# Patient Record
Sex: Male | Born: 1940 | Race: White | Hispanic: No | Smoking: Former smoker
Health system: Southern US, Community
[De-identification: ages and names within clinical notes are randomized; demographics above are authoritative.]

## PROBLEM LIST (undated history)

## (undated) HISTORY — PX: HERNIA REPAIR: SHX51

## (undated) HISTORY — PX: CORONARY ARTERY BYPASS GRAFT: SHX141

---

## 2014-08-26 DIAGNOSIS — E119 Type 2 diabetes mellitus without complications: Secondary | ICD-10-CM

## 2014-08-26 DIAGNOSIS — Z951 Presence of aortocoronary bypass graft: Secondary | ICD-10-CM

## 2014-08-26 DIAGNOSIS — I429 Cardiomyopathy, unspecified: Secondary | ICD-10-CM

## 2014-08-26 HISTORY — DX: Type 2 diabetes mellitus without complications: E11.9

## 2014-08-26 HISTORY — DX: Cardiomyopathy, unspecified: I42.9

## 2014-08-26 HISTORY — DX: Presence of aortocoronary bypass graft: Z95.1

## 2014-09-23 DIAGNOSIS — I5022 Chronic systolic (congestive) heart failure: Secondary | ICD-10-CM

## 2014-09-23 DIAGNOSIS — Z95810 Presence of automatic (implantable) cardiac defibrillator: Secondary | ICD-10-CM

## 2014-09-23 HISTORY — DX: Chronic systolic (congestive) heart failure: I50.22

## 2014-09-23 HISTORY — DX: Presence of automatic (implantable) cardiac defibrillator: Z95.810

## 2016-04-20 DIAGNOSIS — I480 Paroxysmal atrial fibrillation: Secondary | ICD-10-CM

## 2016-04-20 DIAGNOSIS — Z8739 Personal history of other diseases of the musculoskeletal system and connective tissue: Secondary | ICD-10-CM

## 2016-04-20 DIAGNOSIS — I2510 Atherosclerotic heart disease of native coronary artery without angina pectoris: Secondary | ICD-10-CM

## 2016-04-20 DIAGNOSIS — G4733 Obstructive sleep apnea (adult) (pediatric): Secondary | ICD-10-CM

## 2016-04-20 DIAGNOSIS — E785 Hyperlipidemia, unspecified: Secondary | ICD-10-CM | POA: Diagnosis present

## 2016-04-20 DIAGNOSIS — N183 Chronic kidney disease, stage 3 unspecified: Secondary | ICD-10-CM

## 2016-04-20 HISTORY — DX: Paroxysmal atrial fibrillation: I48.0

## 2016-04-20 HISTORY — DX: Obstructive sleep apnea (adult) (pediatric): G47.33

## 2016-04-20 HISTORY — DX: Chronic kidney disease, stage 3 unspecified: N18.30

## 2016-04-20 HISTORY — DX: Personal history of other diseases of the musculoskeletal system and connective tissue: Z87.39

## 2016-04-20 HISTORY — DX: Hyperlipidemia, unspecified: E78.5

## 2016-04-20 HISTORY — DX: Atherosclerotic heart disease of native coronary artery without angina pectoris: I25.10

## 2020-09-18 ENCOUNTER — Emergency Department (HOSPITAL_COMMUNITY): Payer: Medicare PPO

## 2020-09-18 ENCOUNTER — Inpatient Hospital Stay (HOSPITAL_COMMUNITY)
Admission: EM | Admit: 2020-09-18 | Discharge: 2020-09-25 | DRG: 092 | Disposition: A | Discharge status: Home Health | Payer: Medicare PPO | Attending: Internal Medicine | Admitting: Internal Medicine

## 2020-09-18 ENCOUNTER — Encounter (HOSPITAL_COMMUNITY): Payer: Self-pay | Admitting: Emergency Medicine

## 2020-09-18 ENCOUNTER — Other Ambulatory Visit: Payer: Self-pay

## 2020-09-18 DIAGNOSIS — D1809 Hemangioma of other sites: Secondary | ICD-10-CM | POA: Diagnosis present

## 2020-09-18 DIAGNOSIS — I130 Hypertensive heart and chronic kidney disease with heart failure and stage 1 through stage 4 chronic kidney disease, or unspecified chronic kidney disease: Secondary | ICD-10-CM | POA: Diagnosis present

## 2020-09-18 DIAGNOSIS — N183 Chronic kidney disease, stage 3 unspecified: Secondary | ICD-10-CM | POA: Diagnosis present

## 2020-09-18 DIAGNOSIS — Z79899 Other long term (current) drug therapy: Secondary | ICD-10-CM

## 2020-09-18 DIAGNOSIS — R339 Retention of urine, unspecified: Secondary | ICD-10-CM | POA: Diagnosis present

## 2020-09-18 DIAGNOSIS — R269 Unspecified abnormalities of gait and mobility: Secondary | ICD-10-CM | POA: Diagnosis present

## 2020-09-18 DIAGNOSIS — E1165 Type 2 diabetes mellitus with hyperglycemia: Secondary | ICD-10-CM | POA: Diagnosis present

## 2020-09-18 DIAGNOSIS — I429 Cardiomyopathy, unspecified: Secondary | ICD-10-CM | POA: Diagnosis present

## 2020-09-18 DIAGNOSIS — N184 Chronic kidney disease, stage 4 (severe): Secondary | ICD-10-CM | POA: Diagnosis present

## 2020-09-18 DIAGNOSIS — Z85820 Personal history of malignant melanoma of skin: Secondary | ICD-10-CM

## 2020-09-18 DIAGNOSIS — Z7984 Long term (current) use of oral hypoglycemic drugs: Secondary | ICD-10-CM

## 2020-09-18 DIAGNOSIS — E039 Hypothyroidism, unspecified: Secondary | ICD-10-CM | POA: Diagnosis present

## 2020-09-18 DIAGNOSIS — E1122 Type 2 diabetes mellitus with diabetic chronic kidney disease: Secondary | ICD-10-CM | POA: Diagnosis present

## 2020-09-18 DIAGNOSIS — M109 Gout, unspecified: Secondary | ICD-10-CM | POA: Diagnosis present

## 2020-09-18 DIAGNOSIS — G9520 Unspecified cord compression: Principal | ICD-10-CM | POA: Diagnosis present

## 2020-09-18 DIAGNOSIS — D509 Iron deficiency anemia, unspecified: Secondary | ICD-10-CM | POA: Diagnosis present

## 2020-09-18 DIAGNOSIS — E785 Hyperlipidemia, unspecified: Secondary | ICD-10-CM | POA: Diagnosis present

## 2020-09-18 DIAGNOSIS — I480 Paroxysmal atrial fibrillation: Secondary | ICD-10-CM | POA: Diagnosis present

## 2020-09-18 DIAGNOSIS — Z950 Presence of cardiac pacemaker: Secondary | ICD-10-CM

## 2020-09-18 DIAGNOSIS — Z7901 Long term (current) use of anticoagulants: Secondary | ICD-10-CM

## 2020-09-18 DIAGNOSIS — T380X5A Adverse effect of glucocorticoids and synthetic analogues, initial encounter: Secondary | ICD-10-CM | POA: Diagnosis present

## 2020-09-18 DIAGNOSIS — N179 Acute kidney failure, unspecified: Secondary | ICD-10-CM | POA: Diagnosis present

## 2020-09-18 DIAGNOSIS — Z95810 Presence of automatic (implantable) cardiac defibrillator: Secondary | ICD-10-CM | POA: Diagnosis present

## 2020-09-18 DIAGNOSIS — I2510 Atherosclerotic heart disease of native coronary artery without angina pectoris: Secondary | ICD-10-CM | POA: Diagnosis present

## 2020-09-18 DIAGNOSIS — D631 Anemia in chronic kidney disease: Secondary | ICD-10-CM | POA: Diagnosis present

## 2020-09-18 DIAGNOSIS — G8220 Paraplegia, unspecified: Secondary | ICD-10-CM | POA: Diagnosis present

## 2020-09-18 DIAGNOSIS — I69354 Hemiplegia and hemiparesis following cerebral infarction affecting left non-dominant side: Secondary | ICD-10-CM

## 2020-09-18 DIAGNOSIS — I5022 Chronic systolic (congestive) heart failure: Secondary | ICD-10-CM | POA: Diagnosis present

## 2020-09-18 DIAGNOSIS — Z951 Presence of aortocoronary bypass graft: Secondary | ICD-10-CM

## 2020-09-18 DIAGNOSIS — G4733 Obstructive sleep apnea (adult) (pediatric): Secondary | ICD-10-CM | POA: Diagnosis present

## 2020-09-18 DIAGNOSIS — Z8739 Personal history of other diseases of the musculoskeletal system and connective tissue: Secondary | ICD-10-CM

## 2020-09-18 DIAGNOSIS — Z20822 Contact with and (suspected) exposure to covid-19: Secondary | ICD-10-CM | POA: Diagnosis present

## 2020-09-18 DIAGNOSIS — E119 Type 2 diabetes mellitus without complications: Secondary | ICD-10-CM

## 2020-09-18 LAB — BASIC METABOLIC PANEL
Anion gap: 7 (ref 5–15)
BUN: 24 mg/dL — ABNORMAL HIGH (ref 8–23)
CO2: 26 mmol/L (ref 22–32)
Calcium: 10.4 mg/dL — ABNORMAL HIGH (ref 8.9–10.3)
Chloride: 104 mmol/L (ref 98–111)
Creatinine, Ser: 1.64 mg/dL — ABNORMAL HIGH (ref 0.61–1.24)
GFR, Estimated: 43 mL/min — ABNORMAL LOW (ref >60)
Glucose, Bld: 139 mg/dL — ABNORMAL HIGH (ref 70–99)
Potassium: 4.7 mmol/L (ref 3.5–5.1)
Sodium: 137 mmol/L (ref 135–145)

## 2020-09-18 LAB — URINALYSIS, ROUTINE W REFLEX MICROSCOPIC
Bacteria, UA: NONE SEEN
Bilirubin Urine: NEGATIVE
Glucose, UA: NEGATIVE mg/dL
Hgb urine dipstick: NEGATIVE
Ketones, ur: NEGATIVE mg/dL
Leukocytes,Ua: NEGATIVE
Nitrite: NEGATIVE
Protein, ur: 100 mg/dL — AB
Specific Gravity, Urine: 1.014 (ref 1.005–1.030)
pH: 5 (ref 5.0–8.0)

## 2020-09-18 LAB — VITAMIN B12: Vitamin B-12: 705 pg/mL (ref 180–914)

## 2020-09-18 LAB — CBC
HCT: 39.2 % (ref 39.0–52.0)
Hemoglobin: 12.2 g/dL — ABNORMAL LOW (ref 13.0–17.0)
MCH: 28.1 pg (ref 26.0–34.0)
MCHC: 31.1 g/dL (ref 30.0–36.0)
MCV: 90.3 fL (ref 80.0–100.0)
Platelets: 172 10*3/uL (ref 150–400)
RBC: 4.34 MIL/uL (ref 4.22–5.81)
RDW: 16.1 % — ABNORMAL HIGH (ref 11.5–15.5)
WBC: 8.4 10*3/uL (ref 4.0–10.5)
nRBC: 0 % (ref 0.0–0.2)

## 2020-09-18 LAB — TSH: TSH: 6.588 u[IU]/mL — ABNORMAL HIGH (ref 0.350–4.500)

## 2020-09-18 MED ORDER — DEXAMETHASONE SODIUM PHOSPHATE 10 MG/ML IJ SOLN
6.0000 mg | Freq: Four times a day (QID) | INTRAMUSCULAR | Status: DC
  Administered 2020-09-19 – 2020-09-23 (×19): 6 mg via INTRAVENOUS
  Filled 2020-09-18 (×19): qty 1

## 2020-09-18 NOTE — ED Notes (Signed)
Sandwich given 

## 2020-09-18 NOTE — ED Provider Notes (Addendum)
[facility] EMERGENCY DEPARTMENT Provider Note   CSN: [id number] Arrival date & time: 09/18/20  1149   Chief Complaint: Bilateral Leg Weakness  History Chief Complaint  Patient presents with  . Weakness    Timothy Neal is a 80 y.o. male.  HPI   Timothy Neal is a 80 y.o. gentleman w/ PMHx multiple skin cancers including melanoma resected x 2, CABG in 2000 and 2013, HFrEF, cardiomyopathy and PAF s/p ICD replacement 08/06/20 on Eliquis, CVA, DM type II, CKD stage 3, HTN, HLD, who presents to the ED from his living facility [facility]) after experiencing a mechanical fall in the setting of progressive bilateral lower extremity weakness. He states that he has chronic diabetic neuropathy and clubbing of his left foot, and first noticed significant weakness of his LLE after experiencing a CVA this past summer. His weakness improved with PT; however, over the past several months, he has had new, progressive weakness of both his right and left LE's especially within the past couple of days. Following his stroke, he was able to walk with a cane, although progressed to a walker and has required a wheelchair over the past month with limited ability to take steps. He notes associated spasms of his bilateral lower extremities and numbness that now extends up around his waste-line. He has also struggled more over the past several months with urinary retention, constipation, and intermittent stool incontinence. Notes chronic mild low back pain but denies any recent trauma or acute pain to his back or extremities. Denies fevers, chills, any other weakness, headaches, vision changes, flank pain, or any other symptoms. Notes that he is currently scheduled for MRI head and C spine through his neurologist at [facility].   PMHx: See above  Surgical Hx: CABG x 2 s/p ICD replacement in January 2022.  No spinal or LE surgeries.  Family Hx: Patient's father had a stroke.  No family history of  MS, Parkinson's Disease, or other known neurological conditions.      Home Medications Eliquis 5mg  BID Lipitor 40mg  daily Coreg 12.5mg  twice daily Vitamin D 1000mg  daily Digoxin 0.125mg  daily Enalapril 2.5mg  twice daily Ferrous sulfate 325mg  MWF Finasteride 5mg  daily Lasix 20mg  daily Glipizide XL 5mg  BID  Tresiba 18-20 units daily IMDUR 30mg  nightly Centrum Silver Men Oral daily  Multivitamin 1 tablet daily Ozempic 0.5mg  weekly Klor-Con 75mEq ER daily Rapaflo 8mg  daily Sotalol 80mg  BID Turmeric root extract 500mg  daily Colchicine 0.6mg  PRN Gabapentin 300mg  BID   Allergies    Patient has no known allergies.  Review of Systems   Review of Systems   10-point review of systems otherwise negative except as noted above in HPI.   Physical Exam Updated Vital Signs BP (!) 151/77   Pulse 67   Temp 98.3 F (36.8 C) (Oral)   Resp 15   SpO2 98%   Physical Exam   General: Patient appears well. No acute distress. Eyes: Sclera non-icteric. No conjunctival injection.  HENT: Neck is supple. No nasal discharge. Respiratory: Lungs are CTA, bilaterally. No wheezes, rales, or rhonchi.  Cardiovascular: Regular rate and rhythm. No murmurs, rubs, or gallops. There is minimal non-pitting bilateral lower extremity edema. LLE is cool to the touch.  Abdominal: Soft and non-tender to palpation. Bowel sounds intact. No rebound or guarding. Neurological: Patient is alert and oriented x 3. Sensation to light touch is absent to the knees in bilateral lower extremities.  Musculoskeletal: Strength is 2/5 throughout bilateral lower extremities. There is increased muscle tone  with normal muscle bulk in bilateral lower extremities. There is clubbing of the left foot. Skin: No lesions. No rashes.  Psych: Normal affect. Normal tone of voice.   ED Results / Procedures / Treatments   Labs (all labs ordered are listed, but only abnormal results are displayed) Labs Reviewed  BASIC METABOLIC PANEL -  Abnormal; Notable for the following components:      Result Value   Glucose, Bld 139 (*)    BUN 24 (*)    Creatinine, Ser 1.64 (*)    Calcium 10.4 (*)    GFR, Estimated 43 (*)    All other components within normal limits  CBC - Abnormal; Notable for the following components:   Hemoglobin 12.2 (*)    RDW 16.1 (*)    All other components within normal limits  URINALYSIS, ROUTINE W REFLEX MICROSCOPIC - Abnormal; Notable for the following components:   Protein, ur 100 (*)    All other components within normal limits  TSH - Abnormal; Notable for the following components:   TSH 6.588 (*)    All other components within normal limits  RESP PANEL BY RT-PCR (FLU A&B, COVID) ARPGX2  VITAMIN B12  CBG MONITORING, ED    EKG None  Radiology CT Head Wo Contrast  Result Date: 09/18/2020 CLINICAL DATA:  Bilateral lower extremity weakness, fell, anticoagulated EXAM: CT HEAD WITHOUT CONTRAST TECHNIQUE: Contiguous axial images were obtained from the base of the skull through the vertex without intravenous contrast. COMPARISON:  None. FINDINGS: Brain: Hypodensities within the right basal ganglia, bilateral periventricular white matter, and right frontal subcortical white matter are compatible with chronic ischemic changes. No signs of acute infarct or hemorrhage. The lateral ventricles and remaining midline structures are unremarkable. No acute extra-axial fluid collections. No mass effect. Vascular: Moderate atherosclerosis.  No hyperdense vessel. Skull: Normal. Negative for fracture or focal lesion. Sinuses/Orbits: There is opacification of the bilateral maxillary sinuses. Remaining paranasal sinuses are clear. Other: None. IMPRESSION: 1. No acute intracranial process. 2. Bilateral maxillary sinus disease. Electronically Signed   By: Randa Ngo M.D.   On: 09/18/2020 20:36   CT Cervical Spine Wo Contrast  Result Date: 09/18/2020 CLINICAL DATA:  Bilateral lower extremity weakness, fell this morning  EXAM: CT CERVICAL SPINE WITHOUT CONTRAST TECHNIQUE: Multidetector CT imaging of the cervical spine was performed without intravenous contrast. Multiplanar CT image reconstructions were also generated. COMPARISON:  None. FINDINGS: Alignment: Alignment is anatomic. Skull base and vertebrae: No acute fracture. No primary bone lesion or focal pathologic process. Soft tissues and spinal canal: No prevertebral fluid or swelling. No visible canal hematoma. Extensive atherosclerosis at the carotid bifurcations. Disc levels: Bony fusion across the left C2/C3 facet joints. Mild spondylosis at C5-6, with symmetrical bilateral neural foraminal encroachment. Upper chest: Airway is patent.  Lung apices are clear. Other: Reconstructed images demonstrate no additional findings. IMPRESSION: 1. No acute cervical spine fracture. 2. Mild C5/C6 spondylosis with minimal bilateral symmetrical neural foraminal narrowing. Electronically Signed   By: Randa Ngo M.D.   On: 09/18/2020 20:38   CT Thoracic Spine Wo Contrast  Result Date: 09/18/2020 CLINICAL DATA:  Falling. Bilateral leg weakness. Symptoms are worsening. EXAM: CT THORACIC SPINE WITHOUT CONTRAST TECHNIQUE: Multidetector CT images of the thoracic were obtained using the standard protocol without intravenous contrast. COMPARISON:  None. FINDINGS: Alignment: No significant malalignment. Mild scoliotic curvature convex to the right. Vertebrae: No thoracic region fracture. Coarse trabeculations affecting the T6 vertebral body. Posterior margin of the vertebral body appears to  bow. The appearance is most consistent with hemangioma, but 1 that is expanding the bone. There could even be some extraosseous extension of tumor. Thoracic MRI would be suggested. The lesion also extends into the posterior elements. Alternatively, this could be malignant involvement of the bone simulating hemangioma. Below that, no focal bone finding is present. Paraspinal and other soft tissues: Elevated  left hemidiaphragm with left base volume loss. Aortic atherosclerotic calcification. Disc levels: No evidence of significant degenerative disc disease or facet arthropathy. IMPRESSION: Coarse trabeculations affecting the T6 vertebral body, including involvement extending into the posterior elements. Posterior margin of the vertebral body appears to bow. The appearance is most consistent with hemangioma, but one that is expanding the bone. There could even be some extraosseous extension of tumor. Alternatively, this could be malignant involvement of the bone simulating hemangioma. In either case, there appears to be encroachment upon the spinal canal that could cause thoracic cord compression. Thoracic MRI would be suggested. Aortic Atherosclerosis (ICD10-I70.0). Electronically Signed   By: Nelson Chimes M.D.   On: 09/18/2020 20:42   CT Lumbar Spine Wo Contrast  Result Date: 09/18/2020 CLINICAL DATA:  Worsening lower extremity weakness.  Recent falls. EXAM: CT LUMBAR SPINE WITHOUT CONTRAST TECHNIQUE: Multidetector CT imaging of the lumbar spine was performed without intravenous contrast administration. Multiplanar CT image reconstructions were also generated. COMPARISON:  None. FINDINGS: Segmentation: 5 lumbar type vertebral bodies. Alignment: Normal Vertebrae: No fracture or primary bone lesion. Paraspinal and other soft tissues: Aortic atherosclerotic calcification. Some renal atrophy on the right. Disc levels: No significant lumbar region degenerative disease. Mild non-compressive disc bulges. Mild facet osteoarthritis. No significant stenosis of the canal or foramina. IMPRESSION: 1. No acute or traumatic finding. Mild non-compressive disc bulges. Mild facet osteoarthritis. No significant stenosis of the canal or foramina. 2. Aortic atherosclerosis. 3. Right renal atrophy. Aortic Atherosclerosis (ICD10-I70.0). Electronically Signed   By: Nelson Chimes M.D.   On: 09/18/2020 20:44    Procedures Procedures    Medications Ordered in ED Medications  dexamethasone (DECADRON) injection 6 mg (has no administration in time range)    ED Course  I have reviewed the triage vital signs and the nursing notes.  Pertinent labs & imaging results that were available during my care of the patient were reviewed by me and considered in my medical decision making (see chart for details).    MDM Rules/Calculators/A&P                          Assessment:  Mr. Stein has been struggling with rapidly progressive bilateral lower extremity weakness to the point that he is now largely wheelchair bound and presents after experiencing mechanical fall secondary to this earlier today. He follows with Dr. Billy Coast at [facility] for this and was scheduled to get outpatient MRI's of his head and C spine in 3 days although was instructed to come to the ED for evaluation more acutely. Symptoms may be due to worsening diabetic neuropathy, physical deconditioning, spinal cord compression / spinal lesion, or central mass or lesion.   Plan:   - Check CBC, BMP, TSH, B12 levels, urinalysis  - Check CT Head without contrast - Check CT C/T/L spine without contrast   8:50pm: CT T spine notable for T6 encroachment of (most likely) hemangioma vs. Malignancy involving the bone upon the spinal canal that may be causing thoracic cord compression. CT L spine notable for mild non-compressive disc bulges and facet osteoarthritis although without  significant stenosis of the canal or foramina. CT C spine shows mild C5/C6 spondylosis with minimal bilateral symmetrical neural foraminal narrowing. CT Head shows no acute intracranial process. Of note, patient has incidental right renal atrophy.   Labs consistent with possible mild AKI on CKD stage 3, mild normocytic anemia with Hgb 12.2, albuminuria although without signs of infection on urinalysis. TSH and B12 are pending.   T6 spinal cord encroachment could explain patients symptomatology.  Patient would benefit from T spine MRI. However, patient is unsure of which ICD device he has and device type is not listed in patient's medical records from [facility]. MRI with cardiac devices unable to be performed over the weekend per radiology.   - Will consult Neurosurgery   10:35pm: Spoke to neurosurgery who suggest initiating Decadron 6mg  q6 hours, admission to internal medicine for further cancer workup, to find out if patient's ICD is MRI compatible, and to get MRI scheduled ASAP. Anticipate patient would be a poor surgical candidate and do not anticipate any emergent procedures; however, will assess patient tomorrow morning.   - Started Decadron 6mg  q6 hours  - Continue Eliquis for now   11:25pm: Spoke with [facility] hospitalist who will admit patient to internal medicine    Final Clinical Impression(s) / ED Diagnoses Final diagnoses:  Type 2 DM with CKD stage 3 and hypertension ([place])  Spinal cord compression [facility])    Rx / DC Orders ED Discharge Orders    None     Jeralyn Bennett, MD 09/18/2020, 11:33 PM Pager: [phone]    Jeralyn Bennett, MD 09/18/20 1093    Quintella Reichert, MD 09/19/20 1236

## 2020-09-18 NOTE — ED Notes (Signed)
Swab for covid sent to lab

## 2020-09-18 NOTE — ED Triage Notes (Signed)
Pt arrives from [facility] where he has been living for 1 month.  He reports for over 2 week he has began to notice a gradual weakness in both legs to th point where now he is unable to stand on either leg. This morning he had a fall trying to transition from wheelchair to his bed.

## 2020-09-19 ENCOUNTER — Encounter (HOSPITAL_COMMUNITY): Payer: Self-pay | Admitting: Osteopathic Medicine

## 2020-09-19 ENCOUNTER — Inpatient Hospital Stay (HOSPITAL_COMMUNITY): Payer: Medicare PPO

## 2020-09-19 DIAGNOSIS — E7800 Pure hypercholesterolemia, unspecified: Secondary | ICD-10-CM

## 2020-09-19 DIAGNOSIS — T380X5A Adverse effect of glucocorticoids and synthetic analogues, initial encounter: Secondary | ICD-10-CM | POA: Diagnosis present

## 2020-09-19 DIAGNOSIS — I129 Hypertensive chronic kidney disease with stage 1 through stage 4 chronic kidney disease, or unspecified chronic kidney disease: Secondary | ICD-10-CM

## 2020-09-19 DIAGNOSIS — I69354 Hemiplegia and hemiparesis following cerebral infarction affecting left non-dominant side: Secondary | ICD-10-CM | POA: Diagnosis not present

## 2020-09-19 DIAGNOSIS — E785 Hyperlipidemia, unspecified: Secondary | ICD-10-CM | POA: Diagnosis present

## 2020-09-19 DIAGNOSIS — G4733 Obstructive sleep apnea (adult) (pediatric): Secondary | ICD-10-CM

## 2020-09-19 DIAGNOSIS — N183 Chronic kidney disease, stage 3 unspecified: Secondary | ICD-10-CM

## 2020-09-19 DIAGNOSIS — R269 Unspecified abnormalities of gait and mobility: Secondary | ICD-10-CM | POA: Diagnosis present

## 2020-09-19 DIAGNOSIS — N184 Chronic kidney disease, stage 4 (severe): Secondary | ICD-10-CM

## 2020-09-19 DIAGNOSIS — I429 Cardiomyopathy, unspecified: Secondary | ICD-10-CM | POA: Diagnosis present

## 2020-09-19 DIAGNOSIS — G9520 Unspecified cord compression: Secondary | ICD-10-CM | POA: Diagnosis present

## 2020-09-19 DIAGNOSIS — N179 Acute kidney failure, unspecified: Secondary | ICD-10-CM | POA: Diagnosis present

## 2020-09-19 DIAGNOSIS — E1122 Type 2 diabetes mellitus with diabetic chronic kidney disease: Secondary | ICD-10-CM

## 2020-09-19 DIAGNOSIS — I130 Hypertensive heart and chronic kidney disease with heart failure and stage 1 through stage 4 chronic kidney disease, or unspecified chronic kidney disease: Secondary | ICD-10-CM | POA: Diagnosis present

## 2020-09-19 DIAGNOSIS — Z7901 Long term (current) use of anticoagulants: Secondary | ICD-10-CM | POA: Diagnosis not present

## 2020-09-19 DIAGNOSIS — D509 Iron deficiency anemia, unspecified: Secondary | ICD-10-CM | POA: Diagnosis present

## 2020-09-19 DIAGNOSIS — I480 Paroxysmal atrial fibrillation: Secondary | ICD-10-CM | POA: Diagnosis present

## 2020-09-19 DIAGNOSIS — Z20822 Contact with and (suspected) exposure to covid-19: Secondary | ICD-10-CM | POA: Diagnosis present

## 2020-09-19 DIAGNOSIS — R339 Retention of urine, unspecified: Secondary | ICD-10-CM | POA: Diagnosis present

## 2020-09-19 DIAGNOSIS — Z79899 Other long term (current) drug therapy: Secondary | ICD-10-CM | POA: Diagnosis not present

## 2020-09-19 DIAGNOSIS — Z95810 Presence of automatic (implantable) cardiac defibrillator: Secondary | ICD-10-CM | POA: Diagnosis not present

## 2020-09-19 DIAGNOSIS — G8220 Paraplegia, unspecified: Secondary | ICD-10-CM

## 2020-09-19 DIAGNOSIS — I2510 Atherosclerotic heart disease of native coronary artery without angina pectoris: Secondary | ICD-10-CM | POA: Diagnosis present

## 2020-09-19 DIAGNOSIS — E039 Hypothyroidism, unspecified: Secondary | ICD-10-CM | POA: Diagnosis present

## 2020-09-19 DIAGNOSIS — Z951 Presence of aortocoronary bypass graft: Secondary | ICD-10-CM | POA: Diagnosis not present

## 2020-09-19 DIAGNOSIS — D1809 Hemangioma of other sites: Secondary | ICD-10-CM | POA: Diagnosis present

## 2020-09-19 DIAGNOSIS — Z7984 Long term (current) use of oral hypoglycemic drugs: Secondary | ICD-10-CM | POA: Diagnosis not present

## 2020-09-19 DIAGNOSIS — I5022 Chronic systolic (congestive) heart failure: Secondary | ICD-10-CM | POA: Diagnosis present

## 2020-09-19 DIAGNOSIS — E1165 Type 2 diabetes mellitus with hyperglycemia: Secondary | ICD-10-CM

## 2020-09-19 DIAGNOSIS — I255 Ischemic cardiomyopathy: Secondary | ICD-10-CM | POA: Diagnosis not present

## 2020-09-19 HISTORY — DX: Unspecified cord compression: G95.20

## 2020-09-19 LAB — CBC WITH DIFFERENTIAL/PLATELET
Abs Immature Granulocytes: 0.02 10*3/uL (ref 0.00–0.07)
Basophils Absolute: 0 10*3/uL (ref 0.0–0.1)
Basophils Relative: 0 %
Eosinophils Absolute: 0 10*3/uL (ref 0.0–0.5)
Eosinophils Relative: 0 %
HCT: 37.1 % — ABNORMAL LOW (ref 39.0–52.0)
Hemoglobin: 12.4 g/dL — ABNORMAL LOW (ref 13.0–17.0)
Immature Granulocytes: 0 %
Lymphocytes Relative: 9 %
Lymphs Abs: 0.5 10*3/uL — ABNORMAL LOW (ref 0.7–4.0)
MCH: 29.5 pg (ref 26.0–34.0)
MCHC: 33.4 g/dL (ref 30.0–36.0)
MCV: 88.1 fL (ref 80.0–100.0)
Monocytes Absolute: 0 10*3/uL — ABNORMAL LOW (ref 0.1–1.0)
Monocytes Relative: 0 %
Neutro Abs: 4.6 10*3/uL (ref 1.7–7.7)
Neutrophils Relative %: 91 %
Platelets: 167 10*3/uL (ref 150–400)
RBC: 4.21 MIL/uL — ABNORMAL LOW (ref 4.22–5.81)
RDW: 16.2 % — ABNORMAL HIGH (ref 11.5–15.5)
WBC: 5.1 10*3/uL (ref 4.0–10.5)
nRBC: 0 % (ref 0.0–0.2)

## 2020-09-19 LAB — COMPREHENSIVE METABOLIC PANEL
ALT: 26 U/L (ref 0–44)
AST: 28 U/L (ref 15–41)
Albumin: 3.2 g/dL — ABNORMAL LOW (ref 3.5–5.0)
Alkaline Phosphatase: 41 U/L (ref 38–126)
Anion gap: 10 (ref 5–15)
BUN: 29 mg/dL — ABNORMAL HIGH (ref 8–23)
CO2: 21 mmol/L — ABNORMAL LOW (ref 22–32)
Calcium: 9.8 mg/dL (ref 8.9–10.3)
Chloride: 104 mmol/L (ref 98–111)
Creatinine, Ser: 1.64 mg/dL — ABNORMAL HIGH (ref 0.61–1.24)
GFR, Estimated: 43 mL/min — ABNORMAL LOW (ref >60)
Glucose, Bld: 398 mg/dL — ABNORMAL HIGH (ref 70–99)
Potassium: 4.5 mmol/L (ref 3.5–5.1)
Sodium: 135 mmol/L (ref 135–145)
Total Bilirubin: 1.1 mg/dL (ref 0.3–1.2)
Total Protein: 6.9 g/dL (ref 6.5–8.1)

## 2020-09-19 LAB — GLUCOSE, CAPILLARY
Glucose-Capillary: 197 mg/dL — ABNORMAL HIGH (ref 70–99)
Glucose-Capillary: 211 mg/dL — ABNORMAL HIGH (ref 70–99)
Glucose-Capillary: 215 mg/dL — ABNORMAL HIGH (ref 70–99)
Glucose-Capillary: 243 mg/dL — ABNORMAL HIGH (ref 70–99)
Glucose-Capillary: 313 mg/dL — ABNORMAL HIGH (ref 70–99)

## 2020-09-19 LAB — LIPID PANEL
Cholesterol: 156 mg/dL (ref 0–200)
HDL: 32 mg/dL — ABNORMAL LOW (ref >40)
LDL Cholesterol: 91 mg/dL (ref 0–99)
Total CHOL/HDL Ratio: 4.9 RATIO
Triglycerides: 166 mg/dL — ABNORMAL HIGH (ref <150)
VLDL: 33 mg/dL (ref 0–40)

## 2020-09-19 LAB — HEMOGLOBIN A1C
Hgb A1c MFr Bld: 6.7 % — ABNORMAL HIGH (ref 4.8–5.6)
Mean Plasma Glucose: 145.59 mg/dL

## 2020-09-19 LAB — RESP PANEL BY RT-PCR (FLU A&B, COVID) ARPGX2
Influenza A by PCR: NEGATIVE
Influenza B by PCR: NEGATIVE
SARS Coronavirus 2 by RT PCR: NEGATIVE

## 2020-09-19 LAB — MAGNESIUM: Magnesium: 2 mg/dL (ref 1.7–2.4)

## 2020-09-19 LAB — PHOSPHORUS: Phosphorus: 3.6 mg/dL (ref 2.5–4.6)

## 2020-09-19 MED ORDER — IOHEXOL 9 MG/ML PO SOLN
500.0000 mL | ORAL | Status: AC
  Administered 2020-09-19 (×2): 500 mL via ORAL

## 2020-09-19 MED ORDER — SODIUM CHLORIDE 0.9% FLUSH: 3.0000 mL | INTRAVENOUS | Status: DC | PRN

## 2020-09-19 MED ORDER — SOTALOL HCL 80 MG PO TABS
40.0000 mg | ORAL_TABLET | Freq: Every evening | ORAL | Status: DC
  Administered 2020-09-19 – 2020-09-20 (×2): 40 mg via ORAL
  Filled 2020-09-19 (×2): qty 0.5

## 2020-09-19 MED ORDER — DIGOXIN 125 MCG PO TABS
65.2000 ug | ORAL_TABLET | Freq: Every day | ORAL | Status: DC
  Administered 2020-09-19 – 2020-09-24 (×6): 62.5 ug via ORAL
  Filled 2020-09-19: qty 1
  Filled 2020-09-19: qty 0.5
  Filled 2020-09-19 (×4): qty 1

## 2020-09-19 MED ORDER — TAMSULOSIN HCL 0.4 MG PO CAPS
0.4000 mg | ORAL_CAPSULE | Freq: Every day | ORAL | Status: DC
  Administered 2020-09-20 – 2020-09-25 (×6): 0.4 mg via ORAL
  Filled 2020-09-19 (×8): qty 1

## 2020-09-19 MED ORDER — SEMAGLUTIDE(0.25 OR 0.5MG/DOS) 2 MG/1.5ML ~~LOC~~ SOPN
0.5000 mg | PEN_INJECTOR | SUBCUTANEOUS | Status: DC
Start: 2020-09-19 — End: 2020-09-20

## 2020-09-19 MED ORDER — APIXABAN 2.5 MG PO TABS
2.5000 mg | ORAL_TABLET | Freq: Two times a day (BID) | ORAL | Status: DC
  Administered 2020-09-19 – 2020-09-25 (×13): 2.5 mg via ORAL
  Filled 2020-09-19 (×13): qty 1

## 2020-09-19 MED ORDER — HEPARIN SODIUM (PORCINE) 5000 UNIT/ML IJ SOLN
5000.0000 [IU] | Freq: Three times a day (TID) | INTRAMUSCULAR | Status: DC
  Administered 2020-09-19: 5000 [IU] via SUBCUTANEOUS
  Filled 2020-09-19: qty 1

## 2020-09-19 MED ORDER — INSULIN ASPART 100 UNIT/ML ~~LOC~~ SOLN
0.0000 [IU] | SUBCUTANEOUS | Status: DC
  Administered 2020-09-19: 11 [IU] via SUBCUTANEOUS
  Administered 2020-09-19 (×2): 5 [IU] via SUBCUTANEOUS
  Administered 2020-09-20: 3 [IU] via SUBCUTANEOUS
  Administered 2020-09-20 (×2): 8 [IU] via SUBCUTANEOUS
  Administered 2020-09-20: 5 [IU] via SUBCUTANEOUS
  Administered 2020-09-20 – 2020-09-21 (×3): 3 [IU] via SUBCUTANEOUS
  Administered 2020-09-21: 8 [IU] via SUBCUTANEOUS
  Administered 2020-09-21: 5 [IU] via SUBCUTANEOUS
  Administered 2020-09-21: 3 [IU] via SUBCUTANEOUS
  Administered 2020-09-21: 11 [IU] via SUBCUTANEOUS
  Administered 2020-09-22: 3 [IU] via SUBCUTANEOUS
  Administered 2020-09-22: 8 [IU] via SUBCUTANEOUS
  Administered 2020-09-22: 5 [IU] via SUBCUTANEOUS
  Administered 2020-09-22: 3 [IU] via SUBCUTANEOUS

## 2020-09-19 MED ORDER — TAMSULOSIN HCL 0.4 MG PO CAPS: 0.4000 mg | ORAL_CAPSULE | Freq: Every day | ORAL | Status: DC

## 2020-09-19 MED ORDER — SODIUM CHLORIDE 0.9% FLUSH
3.0000 mL | Freq: Two times a day (BID) | INTRAVENOUS | Status: DC
  Administered 2020-09-19 – 2020-09-25 (×13): 3 mL via INTRAVENOUS

## 2020-09-19 MED ORDER — SODIUM CHLORIDE 0.9 % IV SOLN
250.0000 mL | INTRAVENOUS | Status: DC | PRN
Start: 2020-09-19 — End: 2020-09-25

## 2020-09-19 MED ORDER — FINASTERIDE 5 MG PO TABS
5.0000 mg | ORAL_TABLET | Freq: Every day | ORAL | Status: DC
  Administered 2020-09-19 – 2020-09-24 (×6): 5 mg via ORAL
  Filled 2020-09-19 (×6): qty 1

## 2020-09-19 MED ORDER — ISOSORBIDE MONONITRATE ER 30 MG PO TB24
30.0000 mg | ORAL_TABLET | Freq: Every evening | ORAL | Status: DC
  Administered 2020-09-19 – 2020-09-20 (×2): 30 mg via ORAL
  Filled 2020-09-19 (×2): qty 1

## 2020-09-19 NOTE — Progress Notes (Signed)
PROGRESS NOTE    Timothy Neal  GGE:[id number] DOB: [birth year] DOA: 09/18/2020 PCP: [name], MD     Brief Narrative:  [name] is a 80 y.o.WM PMHx CAD, S/P CABG in 2000 and 4650, Chronic Systolic CHF, Cardiomyopathy, Paroxysmal Atrial fibrillation on Eliquis, S/P  ICD replacement 08/06/2020, HTN, CVA with some residual left-sided weakness, DM type II, CKD stage III, HLD,  Presented to the ED after mechanical fall due to progressive bilateral lower extremity weakness which has been ongoing for some time but getting worse over the past few days.  He has progressed from ambulation with cane, to walker, to rollator. Has been following with neurosurgery, was actually scheduled to get an MRI this upcoming Monday (today is Friday/Saturday).   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  Labs consistent with mild AKI on CKD stage III, mild normocytic anemia.  Unable to obtain MRI, per radiology MRI not available through the weekend for patients with implanted ICD.  ED spoke to neurosurgery, initiated Decadron every 6 hours and plan to get MRI ASAP.  Anticipate probably poor surgical candidate but unlikely to require emergent surgery as long as he remains stable, will follow patient closely   Subjective: A/O x4, negative S OB, negative back pain.  Able to move lower extremities slightly   Assessment & Plan: Covid vaccination; vaccinated 3/3   Principal Problem:   Spinal cord compression ([place]) Active Problems:   Cardiomyopathy ([place])   Chronic kidney disease, stage 3 unspecified (HCC)   Chronic systolic heart failure (HCC)   Coronary artery disease   Diabetes mellitus, type II (HCC)   Dyslipidemia   History of gout   Obstructive sleep apnea (adult) (pediatric)   Paroxysmal atrial fibrillation (HCC)   S/P CABG (coronary artery bypass graft)   S/P ICD (internal cardiac defibrillator) procedure   CKD (chronic kidney  disease), stage IV (HCC)   Controlled type 2 diabetes mellitus with hyperglycemia (HCC)   HLD (hyperlipidemia)   Hypothyroidism  Spinal cord compression/mechanical fall -Is, the rapidly progressive lower extremity weakness,  --> Continue Decadron 6 mg every 6 hours. --> Plan for MRI likely Monday, on record review from telephone encounter with his cardiologist 08/24/2020 he CAN have MRI with his current ICD  --> Neurosurgery consult in place, not likely to require urgent intervention, pending MRI  Paroxysmal atrial fibrillation -2/19 currently NSR -Apixaban 2.5 mg BID -Digoxin 62.5 mcg daily -Eliquis . -S/p ICD placement -Imdur 30 mg daily -Sotalol 40 mg daily  Chronic systolic CHF -See A. fib -Strict in and out -Daily weight  OSA  -CPAP per respiratory settings  CKD stage IV (baseline Cr 2.19; at [facility] 04/07/2020) Lab Results  Component Value Date   CREATININE 1.64 (H) 09/19/2020   CREATININE 1.64 (H) 09/18/2020  -Improved from baseline  DM type II controlled with hyperglycemia -12/19 hemoglobin A1c= 6.7 -2/19 moderate SSI  HLD -LDL= 91; goal LDL<70 -2/19 Lipitor 20 mg daily  Hypothyroidism -2/18 TSH= 6.5 elevated -2/19 US thyroid pending     DVT prophylaxis: Eliquis Code Status: Full Family Communication: 2/19 Sister at bedside for discussion of plan of care all questions answered Status is: Inpatient    Dispo: The patient is from: Home              Anticipated d/c is to: Home              Anticipated d/c date is: 2/28  Patient currently unstable      Consultants:  Neurosurgery Dr. Arnoldo Morale    Procedures/Significant Events:    I have personally reviewed and interpreted all radiology studies and my findings are as above.  VENTILATOR SETTINGS:    Cultures   Antimicrobials:    Devices    LINES / TUBES:      Continuous Infusions: . sodium chloride       Objective: Vitals:   09/19/20 0530 09/19/20 0600  09/19/20 0638 09/19/20 1603  BP: (!) 131/57 (!) 128/57 (!) 160/77 (!) 141/64  Pulse: 70 70 72 69  Resp: 18 (!) 23 16 18   Temp:   (!) 97.4 F (36.3 C) 97.8 F (36.6 C)  TempSrc:   Oral Oral  SpO2: 97% 96% 95%   Weight:      Height:        Intake/Output Summary (Last 24 hours) at 09/19/2020 1826 Last data filed at 09/19/2020 1732 Gross per 24 hour  Intake --  Output 300 ml  Net -300 ml   Filed Weights   09/19/20 0003  Weight: 68 kg    Examination:  General: No acute respiratory distress Eyes: negative scleral hemorrhage, negative anisocoria, negative icterus ENT: Negative Runny nose, negative gingival bleeding, Neck:  Negative scars, masses, torticollis, lymphadenopathy, JVD Lungs: Clear to auscultation bilaterally without wheezes or crackles Cardiovascular: Regular rate and rhythm without murmur gallop or rub normal S1 and S2 Abdomen: negative abdominal pain, nondistended, positive soft, bowel sounds, no rebound, no ascites, no appreciable mass Extremities: No significant cyanosis, clubbing, or edema bilateral lower extremities Skin: Negative rashes, lesions, ulcers Psychiatric:  Negative depression, negative anxiety, negative fatigue, negative mania  Central nervous system:  Cranial nerves II through XII intact, tongue/uvula midline, bilateral upper extremities muscle strength 5/5, bilateral lower extremity strength 1/5, sensation intact throughout bilateral upper extremity, sensation bilateral lower extremity decreased soft touch and pinprick  negative dysarthria, negative expressive aphasia, negative receptive aphasia.  .     Data Reviewed: Care during the described time interval was provided by me .  I have reviewed this patient's available data, including medical history, events of note, physical examination, and all test results as part of my evaluation.  CBC: Recent Labs  Lab 09/18/20 1225 09/19/20 0957  WBC 8.4 5.1  NEUTROABS  --  4.6  HGB 12.2* 12.4*  HCT  39.2 37.1*  MCV 90.3 88.1  PLT 172 539   Basic Metabolic Panel: Recent Labs  Lab 09/18/20 1225 09/19/20 0957  NA 137 135  K 4.7 4.5  CL 104 104  CO2 26 21*  GLUCOSE 139* 398*  BUN 24* 29*  CREATININE 1.64* 1.64*  CALCIUM 10.4* 9.8  MG  --  2.0  PHOS  --  3.6   GFR: Estimated Creatinine Clearance: 34.7 mL/min (A) (by C-G formula based on SCr of 1.64 mg/dL (H)). Liver Function Tests: Recent Labs  Lab 09/19/20 0957  AST 28  ALT 26  ALKPHOS 41  BILITOT 1.1  PROT 6.9  ALBUMIN 3.2*   No results for input(s): LIPASE, AMYLASE in the last 168 hours. No results for input(s): AMMONIA in the last 168 hours. Coagulation Profile: No results for input(s): INR, PROTIME in the last 168 hours. Cardiac Enzymes: No results for input(s): CKTOTAL, CKMB, CKMBINDEX, TROPONINI in the last 168 hours. BNP (last 3 results) No results for input(s): PROBNP in the last 8760 hours. HbA1C: Recent Labs    09/19/20 0957  HGBA1C 6.7*   CBG: Recent Labs  Lab 09/19/20 0806 09/19/20 1246 09/19/20 1737  GLUCAP 215* 313* 243*   Lipid Profile: Recent Labs    09/19/20 0957  CHOL 156  HDL 32*  LDLCALC 91  TRIG 166*  CHOLHDL 4.9   Thyroid Function Tests: Recent Labs    09/18/20 2146  TSH 6.588*   Anemia Panel: Recent Labs    09/18/20 2146  VITAMINB12 705   Sepsis Labs: No results for input(s): PROCALCITON, LATICACIDVEN in the last 168 hours.  Recent Results (from the past 240 hour(s))  Resp Panel by RT-PCR (Flu A&B, Covid) Nasopharyngeal Swab     Status: None   Collection Time: 09/18/20 11:16 PM   Specimen: Nasopharyngeal Swab; Nasopharyngeal(NP) swabs in vial transport medium  Result Value Ref Range Status   SARS Coronavirus 2 by RT PCR NEGATIVE NEGATIVE Final    Comment: (NOTE) SARS-CoV-2 target nucleic acids are NOT DETECTED.  The SARS-CoV-2 RNA is generally detectable in upper respiratory specimens during the acute phase of infection. The lowest concentration of  SARS-CoV-2 viral copies this assay can detect is 138 copies/mL. A negative result does not preclude SARS-Cov-2 infection and should not be used as the sole basis for treatment or other patient management decisions. A negative result may occur with  improper specimen collection/handling, submission of specimen other than nasopharyngeal swab, presence of viral mutation(s) within the areas targeted by this assay, and inadequate number of viral copies(<138 copies/mL). A negative result must be combined with clinical observations, patient history, and epidemiological information. The expected result is Negative.  Fact Sheet for Patients:  EntrepreneurPulse.com.au  Fact Sheet for Healthcare Providers:  IncredibleEmployment.be  This test is no t yet approved or cleared by the [place] FDA and  has been authorized for detection and/or diagnosis of SARS-CoV-2 by FDA under an Emergency Use Authorization (EUA). This EUA will remain  in effect (meaning this test can be used) for the duration of the COVID-19 declaration under Section 564(b)(1) of the Act, 21 U.S.C.section 360bbb-3(b)(1), unless the authorization is terminated  or revoked sooner.       Influenza A by PCR NEGATIVE NEGATIVE Final   Influenza B by PCR NEGATIVE NEGATIVE Final    Comment: (NOTE) The Xpert Xpress SARS-CoV-2/FLU/RSV plus assay is intended as an aid in the diagnosis of influenza from Nasopharyngeal swab specimens and should not be used as a sole basis for treatment. Nasal washings and aspirates are unacceptable for Xpert Xpress SARS-CoV-2/FLU/RSV testing.  Fact Sheet for Patients: EntrepreneurPulse.com.au  Fact Sheet for Healthcare Providers: IncredibleEmployment.be  This test is not yet approved or cleared by the [place] FDA and has been authorized for detection and/or diagnosis of SARS-CoV-2 by FDA under an Emergency Use  Authorization (EUA). This EUA will remain in effect (meaning this test can be used) for the duration of the COVID-19 declaration under Section 564(b)(1) of the Act, 21 U.S.C. section 360bbb-3(b)(1), unless the authorization is terminated or revoked.  Performed at [facility], [place] [address], [place], [place] [address]          Radiology Studies: CT Head Wo Contrast  Result Date: 09/18/2020 CLINICAL DATA:  Bilateral lower extremity weakness, fell, anticoagulated EXAM: CT HEAD WITHOUT CONTRAST TECHNIQUE: Contiguous axial images were obtained from the base of the skull through the vertex without intravenous contrast. COMPARISON:  None. FINDINGS: Brain: Hypodensities within the right basal ganglia, bilateral periventricular white matter, and right frontal subcortical white matter are compatible with chronic ischemic changes. No signs of acute infarct or hemorrhage.  The lateral ventricles and remaining midline structures are unremarkable. No acute extra-axial fluid collections. No mass effect. Vascular: Moderate atherosclerosis.  No hyperdense vessel. Skull: Normal. Negative for fracture or focal lesion. Sinuses/Orbits: There is opacification of the bilateral maxillary sinuses. Remaining paranasal sinuses are clear. Other: None. IMPRESSION: 1. No acute intracranial process. 2. Bilateral maxillary sinus disease. Electronically Signed   By: Randa Ngo M.D.   On: 09/18/2020 20:36   CT Cervical Spine Wo Contrast  Result Date: 09/18/2020 CLINICAL DATA:  Bilateral lower extremity weakness, fell this morning EXAM: CT CERVICAL SPINE WITHOUT CONTRAST TECHNIQUE: Multidetector CT imaging of the cervical spine was performed without intravenous contrast. Multiplanar CT image reconstructions were also generated. COMPARISON:  None. FINDINGS: Alignment: Alignment is anatomic. Skull base and vertebrae: No acute fracture. No primary bone lesion or focal pathologic process. Soft tissues and spinal canal: No  prevertebral fluid or swelling. No visible canal hematoma. Extensive atherosclerosis at the carotid bifurcations. Disc levels: Bony fusion across the left C2/C3 facet joints. Mild spondylosis at C5-6, with symmetrical bilateral neural foraminal encroachment. Upper chest: Airway is patent.  Lung apices are clear. Other: Reconstructed images demonstrate no additional findings. IMPRESSION: 1. No acute cervical spine fracture. 2. Mild C5/C6 spondylosis with minimal bilateral symmetrical neural foraminal narrowing. Electronically Signed   By: Randa Ngo M.D.   On: 09/18/2020 20:38   CT Thoracic Spine Wo Contrast  Result Date: 09/18/2020 CLINICAL DATA:  Falling. Bilateral leg weakness. Symptoms are worsening. EXAM: CT THORACIC SPINE WITHOUT CONTRAST TECHNIQUE: Multidetector CT images of the thoracic were obtained using the standard protocol without intravenous contrast. COMPARISON:  None. FINDINGS: Alignment: No significant malalignment. Mild scoliotic curvature convex to the right. Vertebrae: No thoracic region fracture. Coarse trabeculations affecting the T6 vertebral body. Posterior margin of the vertebral body appears to bow. The appearance is most consistent with hemangioma, but 1 that is expanding the bone. There could even be some extraosseous extension of tumor. Thoracic MRI would be suggested. The lesion also extends into the posterior elements. Alternatively, this could be malignant involvement of the bone simulating hemangioma. Below that, no focal bone finding is present. Paraspinal and other soft tissues: Elevated left hemidiaphragm with left base volume loss. Aortic atherosclerotic calcification. Disc levels: No evidence of significant degenerative disc disease or facet arthropathy. IMPRESSION: Coarse trabeculations affecting the T6 vertebral body, including involvement extending into the posterior elements. Posterior margin of the vertebral body appears to bow. The appearance is most consistent with  hemangioma, but one that is expanding the bone. There could even be some extraosseous extension of tumor. Alternatively, this could be malignant involvement of the bone simulating hemangioma. In either case, there appears to be encroachment upon the spinal canal that could cause thoracic cord compression. Thoracic MRI would be suggested. Aortic Atherosclerosis (ICD10-I70.0). Electronically Signed   By: Nelson Chimes M.D.   On: 09/18/2020 20:42   CT Lumbar Spine Wo Contrast  Result Date: 09/18/2020 CLINICAL DATA:  Worsening lower extremity weakness.  Recent falls. EXAM: CT LUMBAR SPINE WITHOUT CONTRAST TECHNIQUE: Multidetector CT imaging of the lumbar spine was performed without intravenous contrast administration. Multiplanar CT image reconstructions were also generated. COMPARISON:  None. FINDINGS: Segmentation: 5 lumbar type vertebral bodies. Alignment: Normal Vertebrae: No fracture or primary bone lesion. Paraspinal and other soft tissues: Aortic atherosclerotic calcification. Some renal atrophy on the right. Disc levels: No significant lumbar region degenerative disease. Mild non-compressive disc bulges. Mild facet osteoarthritis. No significant stenosis of the canal or foramina. IMPRESSION: 1.  No acute or traumatic finding. Mild non-compressive disc bulges. Mild facet osteoarthritis. No significant stenosis of the canal or foramina. 2. Aortic atherosclerosis. 3. Right renal atrophy. Aortic Atherosclerosis (ICD10-I70.0). Electronically Signed   By: Nelson Chimes M.D.   On: 09/18/2020 20:44        Scheduled Meds: . apixaban  2.5 mg Oral BID  . dexamethasone (DECADRON) injection  6 mg Intravenous Q6H  . digoxin  62.5 mcg Oral Daily  . finasteride  5 mg Oral QHS  . insulin aspart  0-15 Units Subcutaneous Q4H  . isosorbide mononitrate  30 mg Oral QPM  . Semaglutide(0.25 or 0.5MG /DOS)  0.5 mg Subcutaneous Weekly  . sodium chloride flush  3 mL Intravenous Q12H  . sotalol  40 mg Oral QPM  .  tamsulosin  0.4 mg Oral Daily   Continuous Infusions: . sodium chloride       LOS: 0 days    Time spent:40 min    Rosaura Bolon, Geraldo Docker, MD [facility]   If 7PM-7AM, please contact night-coverage 09/19/2020, 6:26 PM

## 2020-09-19 NOTE — ED Notes (Signed)
MS Breakfast Ordered 

## 2020-09-19 NOTE — Progress Notes (Signed)
Patient arrival to 5N13 via hospital bed. Patient in bed resting wit no complaints, belongings at bedside . Bed in lowest position and call bell within reach

## 2020-09-19 NOTE — H&P (Signed)
History and Physical    Timothy Neal HFW:[id number] DOB: [birth year] DOA: 09/18/2020  PCP: [name], MD  Outpatient Specialists: neurosurgery, cardiology   Patient coming from: independent living facility   Chief Complaint: fall, weakness   HPI: Timothy Neal is a 80 y.o. male with medical history significant of coronary artery disease status post CABG in 2000 and 2013, HFrEF, cardiomyopathy, paroxysmal atrial fibrillation on anticoagulation, status post ICD replacement 08/06/2020, anticoagulated on Eliquis, CVA with some residual left-sided weakness, type 2 diabetes, CKD stage III, hypertension, hyperlipidemia.  Presented to the ED after mechanical fall due to progressive bilateral lower extremity weakness which has been ongoing for some time but getting worse over the past few days.  He has progressed from ambulation with cane, to walker, to rollator. Has been following with neurosurgery, was actually scheduled to get an MRI this upcoming Monday (today is Friday/Saturday).   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  Labs consistent with mild AKI on CKD stage III, mild normocytic anemia.  Unable to obtain MRI, per radiology MRI not available through the weekend for patients with implanted ICD.  ED spoke to neurosurgery, initiated Decadron every 6 hours and plan to get MRI ASAP.  Anticipate probably poor surgical candidate but unlikely to require emergent surgery as long as he remains stable, will follow patient closely  Review of Systems: As per HPI otherwise 10 point review of systems negative.    Past Medical History:  Diagnosis Date  . Cardiomyopathy ([place]) 08/26/2014  . Chronic kidney disease, stage 3 unspecified ([place]) 04/20/2016  . Chronic systolic heart failure ([place]) 09/23/2014  . Coronary artery disease 04/20/2016  . Diabetes mellitus, type II ([place]) 08/26/2014   Last Assessment & Plan:  Formatting of this note might be  different from the original. Home regimen.  . Dyslipidemia 04/20/2016  . History of gout 04/20/2016  . Obstructive sleep apnea (adult) (pediatric) 04/20/2016   Formatting of this note might be different from the original. does not use CPAP  . Paroxysmal atrial fibrillation ([place]) 04/20/2016   Last Assessment & Plan:  Formatting of this note might be different from the original. Continue sotalol, carvedilol. Hold Apixaban until PCP follow up, if h/H remains stable apixaban to be resumed.  . 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 statin, beta-blocker, ACE inhibitor  . 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 MDT BiV ICD implanted 09/23/14  . Spinal cord compression ([place]) 09/19/2020    History reviewed. No pertinent surgical history.   has no history on file for tobacco use, alcohol use, and drug use.  No Known Allergies  No family history on file.   Prior to Admission medications   Not on File    Physical Exam: Vitals:   09/18/20 2345 09/19/20 0000 09/19/20 0003 09/19/20 0007  BP: (!) 135/58 136/64    Pulse: 68 69    Resp: 18 (!) 24    Temp:    98.7 F (37.1 C)  TempSrc:      SpO2: 95% 97%    Weight:   68 kg   Height:   5\' 7"  (1.702 m)       Constitutional: NAD, calm, comfortable Vitals:   09/18/20 2345 09/19/20 0000 09/19/20 0003 09/19/20 0007  BP: Marland Kitchen)  135/58 136/64    Pulse: 68 69    Resp: 18 (!) 24    Temp:    98.7 F (37.1 C)  TempSrc:      SpO2: 95% 97%    Weight:   68 kg   Height:   5\' 7"  (1.702 m)    Eyes: PERRL, lids and conjunctivae normal ENMT: Mucous membranes are moist. Neck: normal, supple, no masses, no thyromegaly Respiratory: clear to auscultation bilaterally, no wheezing, no crackles. Normal respiratory effort. No accessory muscle use.  Cardiovascular: Regular rate ,  irreg/irreg rhythm, Trace bilateral extremity edema. 2+ pedal pulses. No carotid bruits.  Abdomen: no tenderness, no masses palpated.  Musculoskeletal: significant weakness 2/5 on bilateeral LE L worse than on R Skin: no rashes, lesions, ulcers. No induration Neurologic: CN 2-12 grossly intact. Sensation to light touch dimiished in lower extremities bilaerally Psychiatric: Normal judgment and insight. Alert and oriented x 3. Normal mood.     Labs on Admission: I have personally reviewed following labs and imaging studies  CBC: Recent Labs  Lab 09/18/20 1225  WBC 8.4  HGB 12.2*  HCT 39.2  MCV 90.3  PLT 272   Basic Metabolic Panel: Recent Labs  Lab 09/18/20 1225  NA 137  K 4.7  CL 104  CO2 26  GLUCOSE 139*  BUN 24*  CREATININE 1.64*  CALCIUM 10.4*   GFR: Estimated Creatinine Clearance: 34.7 mL/min (A) (by C-G formula based on SCr of 1.64 mg/dL (H)). Liver Function Tests: No results for input(s): AST, ALT, ALKPHOS, BILITOT, PROT, ALBUMIN in the last 168 hours. No results for input(s): LIPASE, AMYLASE in the last 168 hours. No results for input(s): AMMONIA in the last 168 hours. Coagulation Profile: No results for input(s): INR, PROTIME in the last 168 hours. Cardiac Enzymes: No results for input(s): CKTOTAL, CKMB, CKMBINDEX, TROPONINI in the last 168 hours. BNP (last 3 results) No results for input(s): PROBNP in the last 8760 hours. HbA1C: No results for input(s): HGBA1C in the last 72 hours. CBG: No results for input(s): GLUCAP in the last 168 hours. Lipid Profile: No results for input(s): CHOL, HDL, LDLCALC, TRIG, CHOLHDL, LDLDIRECT in the last 72 hours. Thyroid Function Tests: Recent Labs    09/18/20 2146  TSH 6.588*   Anemia Panel: Recent Labs    09/18/20 2146  VITAMINB12 705   Urine analysis:    Component Value Date/Time   COLORURINE YELLOW 09/18/2020 1959   APPEARANCEUR CLEAR 09/18/2020 1959   LABSPEC 1.014 09/18/2020 1959   PHURINE 5.0  09/18/2020 1959   GLUCOSEU NEGATIVE 09/18/2020 1959   HGBUR NEGATIVE 09/18/2020 1959   BILIRUBINUR NEGATIVE 09/18/2020 [place] NEGATIVE 09/18/2020 1959   PROTEINUR 100 (A) 09/18/2020 1959   NITRITE NEGATIVE 09/18/2020 1959   LEUKOCYTESUR NEGATIVE 09/18/2020 1959   Sepsis Labs: @LABRCNTIP (procalcitonin:4,lacticidven:4) ) Recent Results (from the past 240 hour(s))  Resp Panel by RT-PCR (Flu A&B, Covid) Nasopharyngeal Swab     Status: None   Collection Time: 09/18/20 11:16 PM   Specimen: Nasopharyngeal Swab; Nasopharyngeal(NP) swabs in vial transport medium  Result Value Ref Range Status   SARS Coronavirus 2 by RT PCR NEGATIVE NEGATIVE Final    Comment: (NOTE) SARS-CoV-2 target nucleic acids are NOT DETECTED.  The SARS-CoV-2 RNA is generally detectable in upper respiratory specimens during the acute phase of infection. The lowest concentration of SARS-CoV-2 viral copies this assay can detect is 138 copies/mL. A negative result does not preclude SARS-Cov-2 infection and should not be used as  the sole basis for treatment or other patient management decisions. A negative result may occur with  improper specimen collection/handling, submission of specimen other than nasopharyngeal swab, presence of viral mutation(s) within the areas targeted by this assay, and inadequate number of viral copies(<138 copies/mL). A negative result must be combined with clinical observations, patient history, and epidemiological information. The expected result is Negative.  Fact Sheet for Patients:  EntrepreneurPulse.com.au  Fact Sheet for Healthcare Providers:  IncredibleEmployment.be  This test is no t yet approved or cleared by the [place] FDA and  has been authorized for detection and/or diagnosis of SARS-CoV-2 by FDA under an Emergency Use Authorization (EUA). This EUA will remain  in effect (meaning this test can be used) for the duration of  the COVID-19 declaration under Section 564(b)(1) of the Act, 21 U.S.C.section 360bbb-3(b)(1), unless the authorization is terminated  or revoked sooner.       Influenza A by PCR NEGATIVE NEGATIVE Final   Influenza B by PCR NEGATIVE NEGATIVE Final    Comment: (NOTE) The Xpert Xpress SARS-CoV-2/FLU/RSV plus assay is intended as an aid in the diagnosis of influenza from Nasopharyngeal swab specimens and should not be used as a sole basis for treatment. Nasal washings and aspirates are unacceptable for Xpert Xpress SARS-CoV-2/FLU/RSV testing.  Fact Sheet for Patients: EntrepreneurPulse.com.au  Fact Sheet for Healthcare Providers: IncredibleEmployment.be  This test is not yet approved or cleared by the [place] FDA and has been authorized for detection and/or diagnosis of SARS-CoV-2 by FDA under an Emergency Use Authorization (EUA). This EUA will remain in effect (meaning this test can be used) for the duration of the COVID-19 declaration under Section 564(b)(1) of the Act, 21 U.S.C. section 360bbb-3(b)(1), unless the authorization is terminated or revoked.  Performed at [facility], [place] [address], [place], [place] [address]      Radiological Exams on Admission: CT Head Wo Contrast  Result Date: 09/18/2020 CLINICAL DATA:  Bilateral lower extremity weakness, fell, anticoagulated EXAM: CT HEAD WITHOUT CONTRAST TECHNIQUE: Contiguous axial images were obtained from the base of the skull through the vertex without intravenous contrast. COMPARISON:  None. FINDINGS: Brain: Hypodensities within the right basal ganglia, bilateral periventricular white matter, and right frontal subcortical white matter are compatible with chronic ischemic changes. No signs of acute infarct or hemorrhage. The lateral ventricles and remaining midline structures are unremarkable. No acute extra-axial fluid collections. No mass effect. Vascular: Moderate  atherosclerosis.  No hyperdense vessel. Skull: Normal. Negative for fracture or focal lesion. Sinuses/Orbits: There is opacification of the bilateral maxillary sinuses. Remaining paranasal sinuses are clear. Other: None. IMPRESSION: 1. No acute intracranial process. 2. Bilateral maxillary sinus disease. Electronically Signed   By: Randa Ngo M.D.   On: 09/18/2020 20:36   CT Cervical Spine Wo Contrast  Result Date: 09/18/2020 CLINICAL DATA:  Bilateral lower extremity weakness, fell this morning EXAM: CT CERVICAL SPINE WITHOUT CONTRAST TECHNIQUE: Multidetector CT imaging of the cervical spine was performed without intravenous contrast. Multiplanar CT image reconstructions were also generated. COMPARISON:  None. FINDINGS: Alignment: Alignment is anatomic. Skull base and vertebrae: No acute fracture. No primary bone lesion or focal pathologic process. Soft tissues and spinal canal: No prevertebral fluid or swelling. No visible canal hematoma. Extensive atherosclerosis at the carotid bifurcations. Disc levels: Bony fusion across the left C2/C3 facet joints. Mild spondylosis at C5-6, with symmetrical bilateral neural foraminal encroachment. Upper chest: Airway is patent.  Lung apices are clear. Other: Reconstructed images demonstrate no additional findings.  IMPRESSION: 1. No acute cervical spine fracture. 2. Mild C5/C6 spondylosis with minimal bilateral symmetrical neural foraminal narrowing. Electronically Signed   By: Randa Ngo M.D.   On: 09/18/2020 20:38   CT Thoracic Spine Wo Contrast  Result Date: 09/18/2020 CLINICAL DATA:  Falling. Bilateral leg weakness. Symptoms are worsening. EXAM: CT THORACIC SPINE WITHOUT CONTRAST TECHNIQUE: Multidetector CT images of the thoracic were obtained using the standard protocol without intravenous contrast. COMPARISON:  None. FINDINGS: Alignment: No significant malalignment. Mild scoliotic curvature convex to the right. Vertebrae: No thoracic region fracture. Coarse  trabeculations affecting the T6 vertebral body. Posterior margin of the vertebral body appears to bow. The appearance is most consistent with hemangioma, but 1 that is expanding the bone. There could even be some extraosseous extension of tumor. Thoracic MRI would be suggested. The lesion also extends into the posterior elements. Alternatively, this could be malignant involvement of the bone simulating hemangioma. Below that, no focal bone finding is present. Paraspinal and other soft tissues: Elevated left hemidiaphragm with left base volume loss. Aortic atherosclerotic calcification. Disc levels: No evidence of significant degenerative disc disease or facet arthropathy. IMPRESSION: Coarse trabeculations affecting the T6 vertebral body, including involvement extending into the posterior elements. Posterior margin of the vertebral body appears to bow. The appearance is most consistent with hemangioma, but one that is expanding the bone. There could even be some extraosseous extension of tumor. Alternatively, this could be malignant involvement of the bone simulating hemangioma. In either case, there appears to be encroachment upon the spinal canal that could cause thoracic cord compression. Thoracic MRI would be suggested. Aortic Atherosclerosis (ICD10-I70.0). Electronically Signed   By: Nelson Chimes M.D.   On: 09/18/2020 20:42   CT Lumbar Spine Wo Contrast  Result Date: 09/18/2020 CLINICAL DATA:  Worsening lower extremity weakness.  Recent falls. EXAM: CT LUMBAR SPINE WITHOUT CONTRAST TECHNIQUE: Multidetector CT imaging of the lumbar spine was performed without intravenous contrast administration. Multiplanar CT image reconstructions were also generated. COMPARISON:  None. FINDINGS: Segmentation: 5 lumbar type vertebral bodies. Alignment: Normal Vertebrae: No fracture or primary bone lesion. Paraspinal and other soft tissues: Aortic atherosclerotic calcification. Some renal atrophy on the right. Disc levels: No  significant lumbar region degenerative disease. Mild non-compressive disc bulges. Mild facet osteoarthritis. No significant stenosis of the canal or foramina. IMPRESSION: 1. No acute or traumatic finding. Mild non-compressive disc bulges. Mild facet osteoarthritis. No significant stenosis of the canal or foramina. 2. Aortic atherosclerosis. 3. Right renal atrophy. Aortic Atherosclerosis (ICD10-I70.0). Electronically Signed   By: Nelson Chimes M.D.   On: 09/18/2020 20:44    EKG: Independently reviewed. Assessment/Plan Principal Problem:   Spinal cord compression (HCC) Active Problems:   Cardiomyopathy ([place])   Chronic kidney disease, stage 3 unspecified (HCC)   Chronic systolic heart failure (HCC)   Coronary artery disease   Diabetes mellitus, type II (HCC)   Dyslipidemia   History of gout   Obstructive sleep apnea (adult) (pediatric)   Paroxysmal atrial fibrillation (HCC)   S/P CABG (coronary artery bypass graft)   S/P ICD (internal cardiac defibrillator) procedure   NEUROLOGICAL/MUSCULOSKELETAL Spinal cord compression is, the rapidly progressive lower extremity weakness, mechanical fall --> Continue Decadron 6 mg every 6 hours. --> Plan for MRI likely Monday, on record review from telephone encounter with his cardiologist 08/24/2020 he CAN have MRI with his current ICD  --> Neurosurgery consult in place, not likely to require urgent intervention, pending MRI  CARDIOVASCULAR Stable history includes: Coronary artery disease status  post CABG x2, paroxysmal atrial fibrillation anticoagulated on Eliquis, status post ICD placement, stable heart failure --> continue home meds --> Hold Eliquis for now, transition to heparin in case surgical procedure will require this will be easier to hold  RESPIRATORY History obstructive sleep apnea --> CPAP qhs  RENAL Mild AKI on CKD --> AM labs         DVT prophylaxis: will transition to heparin instead of eliquis in case surgical intervention  requires holding anticoagulation  Code Status: FULL (Full/Partial (specify details) Family Communication: pt states he will update family  Disposition Plan: pending clinical status, may need rehab/SNF Consults called: ER consulted neurosurgery   Admission status: INPATIENT Severity of Illness: The appropriate patient status for this patient is INPATIENT. Inpatient status is judged to be reasonable and necessary in order to provide the required intensity of service to ensure the patient's safety. The patient's presenting symptoms, physical exam findings, and initial radiographic and laboratory data in the context of their chronic comorbidities is felt to place them at high risk for further clinical deterioration. Furthermore, it is not anticipated that the patient will be medically stable for discharge from the hospital within 2 midnights of admission. The following factors support the patient status of inpatient.   " The patient's presenting symptoms include foal neurological weakness d/t spinal cord compression " The worrisome physical exam findings include same as above. " The initial radiographic and laboratory data are worrisome because of spinal cord compression possible need for surgery . " The chronic co-morbidities include see ntotes above .   * I certify that at the point of admission it is my clinical judgment that the patient will require inpatient hospital care spanning beyond 2 midnights from the point of admission due to high intensity of service, high risk for further deterioration and high frequency of surveillance required.Emeterio Reeve DO [facility]   If 7PM-7AM, please contact night-coverage [email or web address]   09/19/2020, 2:09 AM

## 2020-09-19 NOTE — Consult Note (Signed)
Subjective: The patient is a 80 year old white male with multiple medical problems.  He tells me that over the last 3 months he has had the progressive onset of lower extremity weakness and numbness.  He has progressed from walking with a cane to a walker.  The patient has seen a neurologist in [place] who by his report recommended further work-up with a cervical MRI.  This was scheduled for Monday.  He lives in a assisted living facility in [place].  The patient fell in the shower and could not get up.  He was transported to [facility].  The patient has a significant cardiac history including A. fib.  He is on Eliquis.  He has a defibrillator.  He was worked up with a head, cervical, thoracic and lumbar CT.  The thoracic CT demonstrated a left bony lesion, possibly a hemangioma.  However the patient also has a history of multiple skin cancers including melanoma.  The patient was admitted by the hospitalist.  A neurosurgical consultation was requested.  Presently the patient is alert and pleasant.  He gives me the above history.  He has a history of a previous stroke.  He also tells me that for the last 3 months he has been self catheterizing because of urinary retention.  He denies pain.  He admits to numbness from approximately the mid thoracic region down.  He denies neck pain, upper extremity pain, numbness, tingling, weakness.  Past Medical History:  Diagnosis Date  . Cardiomyopathy ([place]) 08/26/2014  . Chronic kidney disease, stage 3 unspecified ([place]) 04/20/2016  . Chronic systolic heart failure ([place]) 09/23/2014  . Coronary artery disease 04/20/2016  . Diabetes mellitus, type II ([place]) 08/26/2014   Last Assessment & Plan:  Formatting of this note might be different from the original. Home regimen.  . Dyslipidemia 04/20/2016  . History of gout 04/20/2016  . Obstructive sleep apnea (adult) (pediatric) 04/20/2016   Formatting of this note might be different from the original. does not use CPAP   . Paroxysmal atrial fibrillation ([place]) 04/20/2016   Last Assessment & Plan:  Formatting of this note might be different from the original. Continue sotalol, carvedilol. Hold Apixaban until PCP follow up, if h/H remains stable apixaban to be resumed.  . 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 statin, beta-blocker, ACE inhibitor  . 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 MDT BiV ICD implanted 09/23/14  . Spinal cord compression ([place]) 09/19/2020    History reviewed. No pertinent surgical history.  No Known Allergies  Social History   Tobacco Use  . Smoking status: Not on file  . Smokeless tobacco: Not on file  Substance Use Topics  . Alcohol use: Not on file    No family history on file. Prior to Admission medications   Medication Sig Start Date End Date Taking? Authorizing Provider  digoxin (LANOXIN) 0.125 MG tablet Take 125 mcg by mouth daily. 09/15/20   [provider]  ELIQUIS 2.5 MG TABS tablet Take 2.5 mg by mouth 2 (two) times daily. 08/25/20   [provider]  finasteride (PROSCAR) 5 MG tablet Take 5 mg by mouth daily. 09/15/20   [provider]  glipiZIDE (GLUCOTROL) 5 MG tablet Take 5 mg by mouth 2 (two) times daily. 09/04/20   [provider]  isosorbide mononitrate (IMDUR) 30  MG 24 hr tablet Take 30 mg by mouth every evening. 09/15/20   [provider]  OZEMPIC, 0.25 OR 0.5 MG/DOSE, 2 MG/1.5ML SOPN Inject 0.5 mg into the skin once a week. 08/28/20   [provider]  silodosin (RAPAFLO) 8 MG CAPS capsule Take 8 mg by mouth daily. 09/02/20   [provider]  sotalol (BETAPACE) 80 MG tablet Take 40 mg by mouth every evening. 09/04/20   [provider]  TRESIBA FLEXTOUCH 100 UNIT/ML FlexTouch Pen Inject into the skin. 08/19/20   [provider]     Review of Systems  Positive ROS: As above  All other systems have been reviewed and were otherwise negative with the exception of those mentioned in the HPI and as above.  Objective: Vital signs in last 24 hours: Temp:  [97.4 F (36.3 C)-98.7 F (37.1 C)] 97.4 F (36.3 C) (02/19 6283) Pulse Rate:  [67-76] 72 (02/19 0638) Resp:  [13-24] 16 (02/19 0638) BP: (69-160)/(50-98) 160/77 (02/19 0638) SpO2:  [89 %-100 %] 95 % (02/19 6629) Weight:  [68 kg] 68 kg (02/19 0003) Estimated body mass index is 23.49 kg/m as calculated from the following:   Height as of this encounter: 5\' 7"  (1.702 m).   Weight as of this encounter: 68 kg.   Physical exam:  General: An alert and pleasant 80 year old white male in no apparent distress.  He is wearing an adult incontinence garment.  HEENT: Normocephalic, atraumatic, extraocular muscles are intact  Neck: No obvious deformity, age-appropriate decrease range of motion.  Spurling's testing was negative.  Lhermitte sign was not present.  Thorax: Symmetric, nontender  Abdomen: Soft  Extremities: Unremarkable  Back exam: Unremarkable  Neurologic exam: The patient is alert and oriented x3.  Cranial nerves II through XII were grossly intact bilaterally.  The patient's motor strength is 5/5 in his bilateral deltoid, bicep, tricep and handgrip.  He has 4+/5 bilateral quadricep, gastrocnemius, dorsiflexors and right iliopsoas strength.  His left iliopsoas strength is approximately 4/5.  The patient maintains light touch sensation in his lower extremities.  I have reviewed the patient's head CT performed at [facility] on 09/18/2020.  His brain is atrophied.  There are no acute findings.  I have also reviewed his cervical and lumbar CT.  There is some degenerative changes.  I reviewed the patient's thoracic CT.  He has a lesion in his left T6 vertebral body and pedicle with spinal stenosis.   Data Review Lab Results   Component Value Date   WBC 8.4 09/18/2020   HGB 12.2 (L) 09/18/2020   HCT 39.2 09/18/2020   MCV 90.3 09/18/2020   PLT 172 09/18/2020   Lab Results  Component Value Date   NA 137 09/18/2020   K 4.7 09/18/2020   CL 104 09/18/2020   CO2 26 09/18/2020   BUN 24 (H) 09/18/2020   CREATININE 1.64 (H) 09/18/2020   GLUCOSE 139 (H) 09/18/2020   No results found for: INR, PROTIME  Assessment/Plan: T6 lesion, paraparesis, thoracic myelopathy: I have discussed the situation with the patient.  I told him that this lesion could be a hemangioma but with his history metastatic cancer is also a possibility.  I have recommended further work-up with a CT of the chest abdomen pelvis to make sure this is not metastatic disease.  According to the [facility] from [facility] the patient's pacemaker is MRI compatible, and in fact he was scheduled for an MRI on Monday.  I am told that we  cannot do MRIs with pacemakers over the weekend.  We will plan to get a thoracic MRI on Monday to better evaluate the situation.  I have told the patient that if this turns out to be a hemangioma, perhaps embolization and radiation would be his best option.  I have explained that at his age and with multiple medical conditions I think that surgery on this lesion would be ill advised because of the notorious tremendous blood loss.  I have answered all his questions.   Ophelia Charter 09/19/2020 8:13 AM

## 2020-09-19 NOTE — H&P (Incomplete)
History and Physical    Timothy Neal YJE:[id number] DOB: 1941-06-16 DOA: 09/18/2020  PCP: [name], MD  Outpatient Specialists: neurosurgery, cardiology   Patient coming from: independent living facility   Chief Complaint: fall, weakness   HPI: Timothy Neal is a 80 y.o. male with medical history significant of coronary artery disease status post CABG in 2000 and 2013, HFrEF, cardiomyopathy, paroxysmal atrial fibrillation on anticoagulation, status post ICD replacement 08/06/2020, anticoagulated on Eliquis, CVA with some residual left-sided weakness, type 2 diabetes, CKD stage III, hypertension, hyperlipidemia.  Presented to the ED after mechanical fall due to progressive bilateral lower extremity weakness which has been ongoing for some time but getting worse over the past few days.  He has progressed from ambulation with cane, to walker, to rollator. Has been following with neurosurgery, was actually scheduled to get an MRI this upcoming Monday (today is Friday/Saturday).   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  Labs consistent with mild AKI on CKD stage III, mild normocytic anemia.  Unable to obtain MRI, per radiology MRI not available through the weekend for patients with implanted ICD.  ED spoke to neurosurgery, initiated Decadron every 6 hours and plan to get MRI ASAP.  Anticipate probably poor surgical candidate but unlikely to require emergent surgery as long as he remains stable, will follow patient closely  Review of Systems: As per HPI otherwise 10 point review of systems negative.    Past Medical History:  Diagnosis Date  . Cardiomyopathy ([place]) 08/26/2014  . Chronic kidney disease, stage 3 unspecified ([place]) 04/20/2016  . Chronic systolic heart failure ([place]) 09/23/2014  . Coronary artery disease 04/20/2016  . Diabetes mellitus, type II ([place]) 08/26/2014   Last Assessment & Plan:  Formatting of this note might be  different from the original. Home regimen.  . Dyslipidemia 04/20/2016  . History of gout 04/20/2016  . Obstructive sleep apnea (adult) (pediatric) 04/20/2016   Formatting of this note might be different from the original. does not use CPAP  . Paroxysmal atrial fibrillation ([place]) 04/20/2016   Last Assessment & Plan:  Formatting of this note might be different from the original. Continue sotalol, carvedilol. Hold Apixaban until PCP follow up, if h/H remains stable apixaban to be resumed.  . 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 statin, beta-blocker, ACE inhibitor  . 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 MDT BiV ICD implanted 09/23/14  . Spinal cord compression ([place]) 09/19/2020    History reviewed. No pertinent surgical history.   has no history on file for tobacco use, alcohol use, and drug use.  No Known Allergies  No family history on file.   Prior to Admission medications   Not on File    Physical Exam: Vitals:   09/18/20 2345 09/19/20 0000 09/19/20 0003 09/19/20 0007  BP: (!) 135/58 136/64    Pulse: 68 69    Resp: 18 (!) 24    Temp:    98.7 F (37.1 C)  TempSrc:      SpO2: 95% 97%    Weight:   68 kg   Height:   5\' 7"  (1.702 m)       Constitutional: NAD, calm, comfortable Vitals:   09/18/20 2345 09/19/20 0000 09/19/20 0003 09/19/20 0007  BP: Marland Kitchen)  135/58 136/64    Pulse: 68 69    Resp: 18 (!) 24    Temp:    98.7 F (37.1 C)  TempSrc:      SpO2: 95% 97%    Weight:   68 kg   Height:   5\' 7"  (1.702 m)    Eyes: PERRL, lids and conjunctivae normal ENMT: Mucous membranes are moist. Neck: normal, supple, no masses, no thyromegaly Respiratory: clear to auscultation bilaterally, no wheezing, no crackles. Normal respiratory effort. No accessory muscle use.  Cardiovascular: Regular rate ,  irreg/irreg rhythm, Trace bilateral extremity edema. 2+ pedal pulses. No carotid bruits.  Abdomen: no tenderness, no masses palpated.  Musculoskeletal: significant weakness 2/5 on bilateeral LE L worse than on R Skin: no rashes, lesions, ulcers. No induration Neurologic: CN 2-12 grossly intact. Sensation to light touch dimiished in lower extremities bilaerally Psychiatric: Normal judgment and insight. Alert and oriented x 3. Normal mood.   (Anything < 9 systems with 2 bullets each down codes to level 1) (If patient refuses exam can't bill higher level) (Make sure to document decubitus ulcers present on admission -- if possible -- and whether patient has chronic indwelling catheter at time of admission)  Labs on Admission: I have personally reviewed following labs and imaging studies  CBC: Recent Labs  Lab 09/18/20 1225  WBC 8.4  HGB 12.2*  HCT 39.2  MCV 90.3  PLT 245   Basic Metabolic Panel: Recent Labs  Lab 09/18/20 1225  NA 137  K 4.7  CL 104  CO2 26  GLUCOSE 139*  BUN 24*  CREATININE 1.64*  CALCIUM 10.4*   GFR: Estimated Creatinine Clearance: 34.7 mL/min (A) (by C-G formula based on SCr of 1.64 mg/dL (H)). Liver Function Tests: No results for input(s): AST, ALT, ALKPHOS, BILITOT, PROT, ALBUMIN in the last 168 hours. No results for input(s): LIPASE, AMYLASE in the last 168 hours. No results for input(s): AMMONIA in the last 168 hours. Coagulation Profile: No results for input(s): INR, PROTIME in the last 168 hours. Cardiac Enzymes: No results for input(s): CKTOTAL, CKMB, CKMBINDEX, TROPONINI in the last 168 hours. BNP (last 3 results) No results for input(s): PROBNP in the last 8760 hours. HbA1C: No results for input(s): HGBA1C in the last 72 hours. CBG: No results for input(s): GLUCAP in the last 168 hours. Lipid Profile: No results for input(s): CHOL, HDL, LDLCALC, TRIG, CHOLHDL, LDLDIRECT in the last 72 hours. Thyroid Function Tests: Recent Labs     09/18/20 2146  TSH 6.588*   Anemia Panel: Recent Labs    09/18/20 2146  VITAMINB12 705   Urine analysis:    Component Value Date/Time   COLORURINE YELLOW 09/18/2020 1959   APPEARANCEUR CLEAR 09/18/2020 1959   LABSPEC 1.014 09/18/2020 1959   PHURINE 5.0 09/18/2020 1959   GLUCOSEU NEGATIVE 09/18/2020 1959   HGBUR NEGATIVE 09/18/2020 1959   BILIRUBINUR NEGATIVE 09/18/2020 [place] NEGATIVE 09/18/2020 1959   PROTEINUR 100 (A) 09/18/2020 1959   NITRITE NEGATIVE 09/18/2020 1959   LEUKOCYTESUR NEGATIVE 09/18/2020 1959   Sepsis Labs: @LABRCNTIP (procalcitonin:4,lacticidven:4) ) Recent Results (from the past 240 hour(s))  Resp Panel by RT-PCR (Flu A&B, Covid) Nasopharyngeal Swab     Status: None   Collection Time: 09/18/20 11:16 PM   Specimen: Nasopharyngeal Swab; Nasopharyngeal(NP) swabs in vial transport medium  Result Value Ref Range Status   SARS Coronavirus 2 by RT PCR NEGATIVE NEGATIVE Final    Comment: (NOTE) SARS-CoV-2 target nucleic acids are NOT DETECTED.  The SARS-CoV-2 RNA is generally detectable in upper respiratory specimens during the acute phase of infection. The lowest concentration of SARS-CoV-2 viral copies this assay can detect is 138 copies/mL. A negative result does not preclude SARS-Cov-2 infection and should not be used as the sole basis for treatment or other patient management decisions. A negative result may occur with  improper specimen collection/handling, submission of specimen other than nasopharyngeal swab, presence of viral mutation(s) within the areas targeted by this assay, and inadequate number of viral copies(<138 copies/mL). A negative result must be combined with clinical observations, patient history, and epidemiological information. The expected result is Negative.  Fact Sheet for Patients:  EntrepreneurPulse.com.au  Fact Sheet for Healthcare Providers:  IncredibleEmployment.be  This test  is no t yet approved or cleared by the [place] FDA and  has been authorized for detection and/or diagnosis of SARS-CoV-2 by FDA under an Emergency Use Authorization (EUA). This EUA will remain  in effect (meaning this test can be used) for the duration of the COVID-19 declaration under Section 564(b)(1) of the Act, 21 U.S.C.section 360bbb-3(b)(1), unless the authorization is terminated  or revoked sooner.       Influenza A by PCR NEGATIVE NEGATIVE Final   Influenza B by PCR NEGATIVE NEGATIVE Final    Comment: (NOTE) The Xpert Xpress SARS-CoV-2/FLU/RSV plus assay is intended as an aid in the diagnosis of influenza from Nasopharyngeal swab specimens and should not be used as a sole basis for treatment. Nasal washings and aspirates are unacceptable for Xpert Xpress SARS-CoV-2/FLU/RSV testing.  Fact Sheet for Patients: EntrepreneurPulse.com.au  Fact Sheet for Healthcare Providers: IncredibleEmployment.be  This test is not yet approved or cleared by the [place] FDA and has been authorized for detection and/or diagnosis of SARS-CoV-2 by FDA under an Emergency Use Authorization (EUA). This EUA will remain in effect (meaning this test can be used) for the duration of the COVID-19 declaration under Section 564(b)(1) of the Act, 21 U.S.C. section 360bbb-3(b)(1), unless the authorization is terminated or revoked.  Performed at [facility], [place] [address], [place], [place] [address]      Radiological Exams on Admission: CT Head Wo Contrast  Result Date: 09/18/2020 CLINICAL DATA:  Bilateral lower extremity weakness, fell, anticoagulated EXAM: CT HEAD WITHOUT CONTRAST TECHNIQUE: Contiguous axial images were obtained from the base of the skull through the vertex without intravenous contrast. COMPARISON:  None. FINDINGS: Brain: Hypodensities within the right basal ganglia, bilateral periventricular white matter, and right frontal  subcortical white matter are compatible with chronic ischemic changes. No signs of acute infarct or hemorrhage. The lateral ventricles and remaining midline structures are unremarkable. No acute extra-axial fluid collections. No mass effect. Vascular: Moderate atherosclerosis.  No hyperdense vessel. Skull: Normal. Negative for fracture or focal lesion. Sinuses/Orbits: There is opacification of the bilateral maxillary sinuses. Remaining paranasal sinuses are clear. Other: None. IMPRESSION: 1. No acute intracranial process. 2. Bilateral maxillary sinus disease. Electronically Signed   By: Randa Ngo M.D.   On: 09/18/2020 20:36   CT Cervical Spine Wo Contrast  Result Date: 09/18/2020 CLINICAL DATA:  Bilateral lower extremity weakness, fell this morning EXAM: CT CERVICAL SPINE WITHOUT CONTRAST TECHNIQUE: Multidetector CT imaging of the cervical spine was performed without intravenous contrast. Multiplanar CT image reconstructions were also generated. COMPARISON:  None. FINDINGS: Alignment: Alignment is anatomic. Skull base and vertebrae: No acute fracture. No primary bone lesion or focal pathologic process. Soft tissues and spinal canal: No prevertebral fluid or swelling. No visible canal  hematoma. Extensive atherosclerosis at the carotid bifurcations. Disc levels: Bony fusion across the left C2/C3 facet joints. Mild spondylosis at C5-6, with symmetrical bilateral neural foraminal encroachment. Upper chest: Airway is patent.  Lung apices are clear. Other: Reconstructed images demonstrate no additional findings. IMPRESSION: 1. No acute cervical spine fracture. 2. Mild C5/C6 spondylosis with minimal bilateral symmetrical neural foraminal narrowing. Electronically Signed   By: Randa Ngo M.D.   On: 09/18/2020 20:38   CT Thoracic Spine Wo Contrast  Result Date: 09/18/2020 CLINICAL DATA:  Falling. Bilateral leg weakness. Symptoms are worsening. EXAM: CT THORACIC SPINE WITHOUT CONTRAST TECHNIQUE: Multidetector  CT images of the thoracic were obtained using the standard protocol without intravenous contrast. COMPARISON:  None. FINDINGS: Alignment: No significant malalignment. Mild scoliotic curvature convex to the right. Vertebrae: No thoracic region fracture. Coarse trabeculations affecting the T6 vertebral body. Posterior margin of the vertebral body appears to bow. The appearance is most consistent with hemangioma, but 1 that is expanding the bone. There could even be some extraosseous extension of tumor. Thoracic MRI would be suggested. The lesion also extends into the posterior elements. Alternatively, this could be malignant involvement of the bone simulating hemangioma. Below that, no focal bone finding is present. Paraspinal and other soft tissues: Elevated left hemidiaphragm with left base volume loss. Aortic atherosclerotic calcification. Disc levels: No evidence of significant degenerative disc disease or facet arthropathy. IMPRESSION: Coarse trabeculations affecting the T6 vertebral body, including involvement extending into the posterior elements. Posterior margin of the vertebral body appears to bow. The appearance is most consistent with hemangioma, but one that is expanding the bone. There could even be some extraosseous extension of tumor. Alternatively, this could be malignant involvement of the bone simulating hemangioma. In either case, there appears to be encroachment upon the spinal canal that could cause thoracic cord compression. Thoracic MRI would be suggested. Aortic Atherosclerosis (ICD10-I70.0). Electronically Signed   By: Nelson Chimes M.D.   On: 09/18/2020 20:42   CT Lumbar Spine Wo Contrast  Result Date: 09/18/2020 CLINICAL DATA:  Worsening lower extremity weakness.  Recent falls. EXAM: CT LUMBAR SPINE WITHOUT CONTRAST TECHNIQUE: Multidetector CT imaging of the lumbar spine was performed without intravenous contrast administration. Multiplanar CT image reconstructions were also generated.  COMPARISON:  None. FINDINGS: Segmentation: 5 lumbar type vertebral bodies. Alignment: Normal Vertebrae: No fracture or primary bone lesion. Paraspinal and other soft tissues: Aortic atherosclerotic calcification. Some renal atrophy on the right. Disc levels: No significant lumbar region degenerative disease. Mild non-compressive disc bulges. Mild facet osteoarthritis. No significant stenosis of the canal or foramina. IMPRESSION: 1. No acute or traumatic finding. Mild non-compressive disc bulges. Mild facet osteoarthritis. No significant stenosis of the canal or foramina. 2. Aortic atherosclerosis. 3. Right renal atrophy. Aortic Atherosclerosis (ICD10-I70.0). Electronically Signed   By: Nelson Chimes M.D.   On: 09/18/2020 20:44    EKG: Independently reviewed. ***  Assessment/Plan Principal Problem:   Spinal cord compression (HCC) Active Problems:   Cardiomyopathy (HCC)   Chronic kidney disease, stage 3 unspecified (HCC)   Chronic systolic heart failure (HCC)   Coronary artery disease   Diabetes mellitus, type II (HCC)   Dyslipidemia   History of gout   Obstructive sleep apnea (adult) (pediatric)   Paroxysmal atrial fibrillation (HCC)   S/P CABG (coronary artery bypass graft)   S/P ICD (internal cardiac defibrillator) procedure   NEUROLOGICAL/MUSCULOSKELETAL Spinal cord compression is, the rapidly progressive lower extremity weakness, mechanical fall --> Continue Decadron 6 mg every 6 hours. -->  Plan for MRI likely Monday, on record review from telephone encounter with his cardiologist 08/24/2020 he CAN have MRI with his current ICD  --> Neurosurgery consult in place, not likely to require urgent intervention, pending MRI  CARDIOVASCULAR Stable history includes: Coronary artery disease status post CABG x2, paroxysmal atrial fibrillation anticoagulated on Eliquis, status post ICD placement, stable heart failure --> continue home meds --> Hold Eliquis for now, transition to heparin in case  surgical procedure will require this will be easier to hold  RESPIRATORY History obstructive sleep apnea --> CPAP qhs  PSYCHIATRIC Hx:  Meds:   RENAL Mild AKI on CKD --> AM labs   GASTROINTESTINAL   ENDOCRINE Hx:  Meds:   GENITOURINARY Hx:  Meds:  Male: PSA needed? ***  REPRODUCTIVE Hx:  Meds: GYN: Last Pap *** was ***. G***P*** w/ *** history of ***. Current contraception: *** and is *** happy with this method.   MUSCULOSKELETAL/RHEUM Hx:  Meds:   HEENT Hx:  Meds:   SKIN Hx:  Meds:   INFECTIOUS DISEASE Hx: Meds: HIV screening? *** Hep C screening? *** Vaccines needed? ***        DVT prophylaxis: will transition to heparin instead of eliquis in case surgical intervention requires holding anticoagulation  Code Status: FULL (Full/Partial (specify details) Family Communication: pt states he will update family  Disposition Plan: pending clinical status, may need rehab/SNF Consults called: ER consulted neurosurgery   Admission status: INPATIENT Severity of Illness: The appropriate patient status for this patient is INPATIENT. Inpatient status is judged to be reasonable and necessary in order to provide the required intensity of service to ensure the patient's safety. The patient's presenting symptoms, physical exam findings, and initial radiographic and laboratory data in the context of their chronic comorbidities is felt to place them at high risk for further clinical deterioration. Furthermore, it is not anticipated that the patient will be medically stable for discharge from the hospital within 2 midnights of admission. The following factors support the patient status of inpatient.   " The patient's presenting symptoms include foal neurological weakness d/t spinal cord compression " The worrisome physical exam findings include same as above. " The initial radiographic and laboratory data are worrisome because of spinal cord compression possible need  for surgery . " The chronic co-morbidities include see ntotes above .   * I certify that at the point of admission it is my clinical judgment that the patient will require inpatient hospital care spanning beyond 2 midnights from the point of admission due to high intensity of service, high risk for further deterioration and high frequency of surveillance required.Emeterio Reeve DO [facility]   If 7PM-7AM, please contact night-coverage [email or web address]   09/19/2020, 12:45 AM

## 2020-09-19 NOTE — Progress Notes (Signed)
Pt has refused cpap for the night stating "I haven't worn one in years, or needed one."  RT will continue to monitor.

## 2020-09-19 NOTE — Plan of Care (Signed)
  Problem: Education: Goal: Knowledge of General Education information will improve Description Including pain rating scale, medication(s)/side effects and non-pharmacologic comfort measures Outcome: Progressing   

## 2020-09-19 NOTE — H&P (Signed)
History and Physical    Timothy Neal GDJ:[id number] DOB: 12/04/1940 DOA: 09/18/2020  PCP: [name], MD  Outpatient Specialists: neurosurgery, cardiology   Patient coming from: independent living facility   Chief Complaint: fall, weakness   HPI: Timothy Neal is a 80 y.o. male with medical history significant of coronary artery disease status post CABG in 2000 and 2013, HFrEF, cardiomyopathy, paroxysmal atrial fibrillation on anticoagulation, status post ICD replacement 08/06/2020, anticoagulated on Eliquis, CVA with some residual left-sided weakness, type 2 diabetes, CKD stage III, hypertension, hyperlipidemia.  Presented to the ED after mechanical fall due to progressive bilateral lower extremity weakness which has been ongoing for some time but getting worse over the past few days.  He has progressed from ambulation with cane, to walker, to rollator. Has been following with neurosurgery, was actually scheduled to get an MRI this upcoming Monday (today is Friday/Saturday).   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  Labs consistent with mild AKI on CKD stage III, mild normocytic anemia.  Unable to obtain MRI, per radiology MRI not available through the weekend for patients with implanted ICD.  ED spoke to neurosurgery, initiated Decadron every 6 hours and plan to get MRI ASAP.  Anticipate probably poor surgical candidate but unlikely to require emergent surgery as long as he remains stable, will follow patient closely  Review of Systems: As per HPI otherwise 10 point review of systems negative.    Past Medical History:  Diagnosis Date  . Cardiomyopathy ([place]) 08/26/2014  . Chronic kidney disease, stage 3 unspecified ([place]) 04/20/2016  . Chronic systolic heart failure ([place]) 09/23/2014  . Coronary artery disease 04/20/2016  . Diabetes mellitus, type II ([place]) 08/26/2014   Last Assessment & Plan:  Formatting of this note might be  different from the original. Home regimen.  . Dyslipidemia 04/20/2016  . History of gout 04/20/2016  . Obstructive sleep apnea (adult) (pediatric) 04/20/2016   Formatting of this note might be different from the original. does not use CPAP  . Paroxysmal atrial fibrillation ([place]) 04/20/2016   Last Assessment & Plan:  Formatting of this note might be different from the original. Continue sotalol, carvedilol. Hold Apixaban until PCP follow up, if h/H remains stable apixaban to be resumed.  . 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 statin, beta-blocker, ACE inhibitor  . 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 MDT BiV ICD implanted 09/23/14  . Spinal cord compression ([place]) 09/19/2020    History reviewed. No pertinent surgical history.   has no history on file for tobacco use, alcohol use, and drug use.  No Known Allergies  No family history on file.   Prior to Admission medications   Not on File    Physical Exam: Vitals:   09/18/20 2345 09/19/20 0000 09/19/20 0003 09/19/20 0007  BP: (!) 135/58 136/64    Pulse: 68 69    Resp: 18 (!) 24    Temp:    98.7 F (37.1 C)  TempSrc:      SpO2: 95% 97%    Weight:   68 kg   Height:   5\' 7"  (1.702 m)       Constitutional: NAD, calm, comfortable Vitals:   09/18/20 2345 09/19/20 0000 09/19/20 0003 09/19/20 0007  BP: Marland Kitchen)  135/58 136/64    Pulse: 68 69    Resp: 18 (!) 24    Temp:    98.7 F (37.1 C)  TempSrc:      SpO2: 95% 97%    Weight:   68 kg   Height:   5\' 7"  (1.702 m)    Eyes: PERRL, lids and conjunctivae normal ENMT: Mucous membranes are moist. Neck: normal, supple, no masses, no thyromegaly Respiratory: clear to auscultation bilaterally, no wheezing, no crackles. Normal respiratory effort. No accessory muscle use.  Cardiovascular: Regular rate ,  irreg/irreg rhythm, Trace bilateral extremity edema. 2+ pedal pulses. No carotid bruits.  Abdomen: no tenderness, no masses palpated.  Musculoskeletal: significant weakness 2/5 on bilateeral LE L worse than on R Skin: no rashes, lesions, ulcers. No induration Neurologic: CN 2-12 grossly intact. Sensation to light touch dimiished in lower extremities bilaerally Psychiatric: Normal judgment and insight. Alert and oriented x 3. Normal mood.    Labs on Admission: I have personally reviewed following labs and imaging studies  CBC: Recent Labs  Lab 09/18/20 1225  WBC 8.4  HGB 12.2*  HCT 39.2  MCV 90.3  PLT 945   Basic Metabolic Panel: Recent Labs  Lab 09/18/20 1225  NA 137  K 4.7  CL 104  CO2 26  GLUCOSE 139*  BUN 24*  CREATININE 1.64*  CALCIUM 10.4*   GFR: Estimated Creatinine Clearance: 34.7 mL/min (A) (by C-G formula based on SCr of 1.64 mg/dL (H)). Liver Function Tests: No results for input(s): AST, ALT, ALKPHOS, BILITOT, PROT, ALBUMIN in the last 168 hours. No results for input(s): LIPASE, AMYLASE in the last 168 hours. No results for input(s): AMMONIA in the last 168 hours. Coagulation Profile: No results for input(s): INR, PROTIME in the last 168 hours. Cardiac Enzymes: No results for input(s): CKTOTAL, CKMB, CKMBINDEX, TROPONINI in the last 168 hours. BNP (last 3 results) No results for input(s): PROBNP in the last 8760 hours. HbA1C: No results for input(s): HGBA1C in the last 72 hours. CBG: No results for input(s): GLUCAP in the last 168 hours. Lipid Profile: No results for input(s): CHOL, HDL, LDLCALC, TRIG, CHOLHDL, LDLDIRECT in the last 72 hours. Thyroid Function Tests: Recent Labs    09/18/20 2146  TSH 6.588*   Anemia Panel: Recent Labs    09/18/20 2146  VITAMINB12 705   Urine analysis:    Component Value Date/Time   COLORURINE YELLOW 09/18/2020 1959   APPEARANCEUR CLEAR 09/18/2020 1959   LABSPEC 1.014 09/18/2020 1959   PHURINE 5.0  09/18/2020 1959   GLUCOSEU NEGATIVE 09/18/2020 1959   HGBUR NEGATIVE 09/18/2020 1959   BILIRUBINUR NEGATIVE 09/18/2020 [place] NEGATIVE 09/18/2020 1959   PROTEINUR 100 (A) 09/18/2020 1959   NITRITE NEGATIVE 09/18/2020 1959   LEUKOCYTESUR NEGATIVE 09/18/2020 1959   Sepsis Labs: @LABRCNTIP (procalcitonin:4,lacticidven:4) ) Recent Results (from the past 240 hour(s))  Resp Panel by RT-PCR (Flu A&B, Covid) Nasopharyngeal Swab     Status: None   Collection Time: 09/18/20 11:16 PM   Specimen: Nasopharyngeal Swab; Nasopharyngeal(NP) swabs in vial transport medium  Result Value Ref Range Status   SARS Coronavirus 2 by RT PCR NEGATIVE NEGATIVE Final    Comment: (NOTE) SARS-CoV-2 target nucleic acids are NOT DETECTED.  The SARS-CoV-2 RNA is generally detectable in upper respiratory specimens during the acute phase of infection. The lowest concentration of SARS-CoV-2 viral copies this assay can detect is 138 copies/mL. A negative result does not preclude SARS-Cov-2 infection and should not be used as the  sole basis for treatment or other patient management decisions. A negative result may occur with  improper specimen collection/handling, submission of specimen other than nasopharyngeal swab, presence of viral mutation(s) within the areas targeted by this assay, and inadequate number of viral copies(<138 copies/mL). A negative result must be combined with clinical observations, patient history, and epidemiological information. The expected result is Negative.  Fact Sheet for Patients:  EntrepreneurPulse.com.au  Fact Sheet for Healthcare Providers:  IncredibleEmployment.be  This test is no t yet approved or cleared by the [place] FDA and  has been authorized for detection and/or diagnosis of SARS-CoV-2 by FDA under an Emergency Use Authorization (EUA). This EUA will remain  in effect (meaning this test can be used) for the duration of  the COVID-19 declaration under Section 564(b)(1) of the Act, 21 U.S.C.section 360bbb-3(b)(1), unless the authorization is terminated  or revoked sooner.       Influenza A by PCR NEGATIVE NEGATIVE Final   Influenza B by PCR NEGATIVE NEGATIVE Final    Comment: (NOTE) The Xpert Xpress SARS-CoV-2/FLU/RSV plus assay is intended as an aid in the diagnosis of influenza from Nasopharyngeal swab specimens and should not be used as a sole basis for treatment. Nasal washings and aspirates are unacceptable for Xpert Xpress SARS-CoV-2/FLU/RSV testing.  Fact Sheet for Patients: EntrepreneurPulse.com.au  Fact Sheet for Healthcare Providers: IncredibleEmployment.be  This test is not yet approved or cleared by the [place] FDA and has been authorized for detection and/or diagnosis of SARS-CoV-2 by FDA under an Emergency Use Authorization (EUA). This EUA will remain in effect (meaning this test can be used) for the duration of the COVID-19 declaration under Section 564(b)(1) of the Act, 21 U.S.C. section 360bbb-3(b)(1), unless the authorization is terminated or revoked.  Performed at [facility], [place] [address] [place], [place] [address]      Radiological Exams on Admission: CT Head Wo Contrast  Result Date: 09/18/2020 CLINICAL DATA:  Bilateral lower extremity weakness, fell, anticoagulated EXAM: CT HEAD WITHOUT CONTRAST TECHNIQUE: Contiguous axial images were obtained from the base of the skull through the vertex without intravenous contrast. COMPARISON:  None. FINDINGS: Brain: Hypodensities within the right basal ganglia, bilateral periventricular white matter, and right frontal subcortical white matter are compatible with chronic ischemic changes. No signs of acute infarct or hemorrhage. The lateral ventricles and remaining midline structures are unremarkable. No acute extra-axial fluid collections. No mass effect. Vascular: Moderate  atherosclerosis.  No hyperdense vessel. Skull: Normal. Negative for fracture or focal lesion. Sinuses/Orbits: There is opacification of the bilateral maxillary sinuses. Remaining paranasal sinuses are clear. Other: None. IMPRESSION: 1. No acute intracranial process. 2. Bilateral maxillary sinus disease. Electronically Signed   By: Randa Ngo M.D.   On: 09/18/2020 20:36   CT Cervical Spine Wo Contrast  Result Date: 09/18/2020 CLINICAL DATA:  Bilateral lower extremity weakness, fell this morning EXAM: CT CERVICAL SPINE WITHOUT CONTRAST TECHNIQUE: Multidetector CT imaging of the cervical spine was performed without intravenous contrast. Multiplanar CT image reconstructions were also generated. COMPARISON:  None. FINDINGS: Alignment: Alignment is anatomic. Skull base and vertebrae: No acute fracture. No primary bone lesion or focal pathologic process. Soft tissues and spinal canal: No prevertebral fluid or swelling. No visible canal hematoma. Extensive atherosclerosis at the carotid bifurcations. Disc levels: Bony fusion across the left C2/C3 facet joints. Mild spondylosis at C5-6, with symmetrical bilateral neural foraminal encroachment. Upper chest: Airway is patent.  Lung apices are clear. Other: Reconstructed images demonstrate no additional findings. IMPRESSION:  1. No acute cervical spine fracture. 2. Mild C5/C6 spondylosis with minimal bilateral symmetrical neural foraminal narrowing. Electronically Signed   By: Randa Ngo M.D.   On: 09/18/2020 20:38   CT Thoracic Spine Wo Contrast  Result Date: 09/18/2020 CLINICAL DATA:  Falling. Bilateral leg weakness. Symptoms are worsening. EXAM: CT THORACIC SPINE WITHOUT CONTRAST TECHNIQUE: Multidetector CT images of the thoracic were obtained using the standard protocol without intravenous contrast. COMPARISON:  None. FINDINGS: Alignment: No significant malalignment. Mild scoliotic curvature convex to the right. Vertebrae: No thoracic region fracture. Coarse  trabeculations affecting the T6 vertebral body. Posterior margin of the vertebral body appears to bow. The appearance is most consistent with hemangioma, but 1 that is expanding the bone. There could even be some extraosseous extension of tumor. Thoracic MRI would be suggested. The lesion also extends into the posterior elements. Alternatively, this could be malignant involvement of the bone simulating hemangioma. Below that, no focal bone finding is present. Paraspinal and other soft tissues: Elevated left hemidiaphragm with left base volume loss. Aortic atherosclerotic calcification. Disc levels: No evidence of significant degenerative disc disease or facet arthropathy. IMPRESSION: Coarse trabeculations affecting the T6 vertebral body, including involvement extending into the posterior elements. Posterior margin of the vertebral body appears to bow. The appearance is most consistent with hemangioma, but one that is expanding the bone. There could even be some extraosseous extension of tumor. Alternatively, this could be malignant involvement of the bone simulating hemangioma. In either case, there appears to be encroachment upon the spinal canal that could cause thoracic cord compression. Thoracic MRI would be suggested. Aortic Atherosclerosis (ICD10-I70.0). Electronically Signed   By: Nelson Chimes M.D.   On: 09/18/2020 20:42   CT Lumbar Spine Wo Contrast  Result Date: 09/18/2020 CLINICAL DATA:  Worsening lower extremity weakness.  Recent falls. EXAM: CT LUMBAR SPINE WITHOUT CONTRAST TECHNIQUE: Multidetector CT imaging of the lumbar spine was performed without intravenous contrast administration. Multiplanar CT image reconstructions were also generated. COMPARISON:  None. FINDINGS: Segmentation: 5 lumbar type vertebral bodies. Alignment: Normal Vertebrae: No fracture or primary bone lesion. Paraspinal and other soft tissues: Aortic atherosclerotic calcification. Some renal atrophy on the right. Disc levels: No  significant lumbar region degenerative disease. Mild non-compressive disc bulges. Mild facet osteoarthritis. No significant stenosis of the canal or foramina. IMPRESSION: 1. No acute or traumatic finding. Mild non-compressive disc bulges. Mild facet osteoarthritis. No significant stenosis of the canal or foramina. 2. Aortic atherosclerosis. 3. Right renal atrophy. Aortic Atherosclerosis (ICD10-I70.0). Electronically Signed   By: Nelson Chimes M.D.   On: 09/18/2020 20:44    EKG: Independently reviewed.   Assessment/Plan Principal Problem:   Spinal cord compression (HCC) Active Problems:   Cardiomyopathy ([place])   Chronic kidney disease, stage 3 unspecified (HCC)   Chronic systolic heart failure (HCC)   Coronary artery disease   Diabetes mellitus, type II (HCC)   Dyslipidemia   History of gout   Obstructive sleep apnea (adult) (pediatric)   Paroxysmal atrial fibrillation (HCC)   S/P CABG (coronary artery bypass graft)   S/P ICD (internal cardiac defibrillator) procedure   NEUROLOGICAL/MUSCULOSKELETAL Spinal cord compression is, the rapidly progressive lower extremity weakness, mechanical fall --> Continue Decadron 6 mg every 6 hours. --> Plan for MRI likely Monday, on record review from telephone encounter with his cardiologist 08/24/2020 he CAN have MRI with his current ICD  --> Neurosurgery consult in place, not likely to require urgent intervention, pending MRI  CARDIOVASCULAR Stable history includes: Coronary artery disease  status post CABG x2, paroxysmal atrial fibrillation anticoagulated on Eliquis, status post ICD placement, stable heart failure --> continue home meds --> Hold Eliquis for now, transition to heparin in case surgical procedure will require this will be easier to hold  RESPIRATORY History obstructive sleep apnea --> CPAP qhs  PSYCHIATRIC Hx:  Meds:   RENAL Mild AKI on CKD --> AM labs           DVT prophylaxis: will transition to heparin instead of  eliquis in case surgical intervention requires holding anticoagulation  Code Status: FULL (Full/Partial (specify details) Family Communication: pt states he will update family  Disposition Plan: pending clinical status, may need rehab/SNF Consults called: ER consulted neurosurgery   Admission status: INPATIENT Severity of Illness: The appropriate patient status for this patient is INPATIENT. Inpatient status is judged to be reasonable and necessary in order to provide the required intensity of service to ensure the patient's safety. The patient's presenting symptoms, physical exam findings, and initial radiographic and laboratory data in the context of their chronic comorbidities is felt to place them at high risk for further clinical deterioration. Furthermore, it is not anticipated that the patient will be medically stable for discharge from the hospital within 2 midnights of admission. The following factors support the patient status of inpatient.   " The patient's presenting symptoms include foal neurological weakness d/t spinal cord compression " The worrisome physical exam findings include same as above. " The initial radiographic and laboratory data are worrisome because of spinal cord compression possible need for surgery . " The chronic co-morbidities include see ntotes above .   * I certify that at the point of admission it is my clinical judgment that the patient will require inpatient hospital care spanning beyond 2 midnights from the point of admission due to high intensity of service, high risk for further deterioration and high frequency of surveillance required.Emeterio Reeve DO [facility]   If 7PM-7AM, please contact night-coverage [email or web address]   09/19/2020, 2:11 AM

## 2020-09-20 ENCOUNTER — Encounter (HOSPITAL_COMMUNITY): Payer: Self-pay | Admitting: Osteopathic Medicine

## 2020-09-20 ENCOUNTER — Inpatient Hospital Stay (HOSPITAL_COMMUNITY): Payer: Medicare PPO

## 2020-09-20 ENCOUNTER — Other Ambulatory Visit: Payer: Self-pay

## 2020-09-20 LAB — CBC WITH DIFFERENTIAL/PLATELET
Abs Immature Granulocytes: 0.05 10*3/uL (ref 0.00–0.07)
Basophils Absolute: 0 10*3/uL (ref 0.0–0.1)
Basophils Relative: 0 %
Eosinophils Absolute: 0 10*3/uL (ref 0.0–0.5)
Eosinophils Relative: 0 %
HCT: 34.7 % — ABNORMAL LOW (ref 39.0–52.0)
Hemoglobin: 11.3 g/dL — ABNORMAL LOW (ref 13.0–17.0)
Immature Granulocytes: 0 %
Lymphocytes Relative: 9 %
Lymphs Abs: 1 10*3/uL (ref 0.7–4.0)
MCH: 28.8 pg (ref 26.0–34.0)
MCHC: 32.6 g/dL (ref 30.0–36.0)
MCV: 88.3 fL (ref 80.0–100.0)
Monocytes Absolute: 0.3 10*3/uL (ref 0.1–1.0)
Monocytes Relative: 3 %
Neutro Abs: 10.1 10*3/uL — ABNORMAL HIGH (ref 1.7–7.7)
Neutrophils Relative %: 88 %
Platelets: 173 10*3/uL (ref 150–400)
RBC: 3.93 MIL/uL — ABNORMAL LOW (ref 4.22–5.81)
RDW: 15.9 % — ABNORMAL HIGH (ref 11.5–15.5)
WBC: 11.4 10*3/uL — ABNORMAL HIGH (ref 4.0–10.5)
nRBC: 0 % (ref 0.0–0.2)

## 2020-09-20 LAB — COMPREHENSIVE METABOLIC PANEL
ALT: 24 U/L (ref 0–44)
AST: 32 U/L (ref 15–41)
Albumin: 3 g/dL — ABNORMAL LOW (ref 3.5–5.0)
Alkaline Phosphatase: 32 U/L — ABNORMAL LOW (ref 38–126)
Anion gap: 13 (ref 5–15)
BUN: 32 mg/dL — ABNORMAL HIGH (ref 8–23)
CO2: 18 mmol/L — ABNORMAL LOW (ref 22–32)
Calcium: 10 mg/dL (ref 8.9–10.3)
Chloride: 101 mmol/L (ref 98–111)
Creatinine, Ser: 1.63 mg/dL — ABNORMAL HIGH (ref 0.61–1.24)
GFR, Estimated: 43 mL/min — ABNORMAL LOW (ref >60)
Glucose, Bld: 178 mg/dL — ABNORMAL HIGH (ref 70–99)
Potassium: 4.8 mmol/L (ref 3.5–5.1)
Sodium: 132 mmol/L — ABNORMAL LOW (ref 135–145)
Total Bilirubin: 1.2 mg/dL (ref 0.3–1.2)
Total Protein: 6.2 g/dL — ABNORMAL LOW (ref 6.5–8.1)

## 2020-09-20 LAB — PHOSPHORUS: Phosphorus: 3.2 mg/dL (ref 2.5–4.6)

## 2020-09-20 LAB — GLUCOSE, CAPILLARY
Glucose-Capillary: 176 mg/dL — ABNORMAL HIGH (ref 70–99)
Glucose-Capillary: 172 mg/dL — ABNORMAL HIGH (ref 70–99)
Glucose-Capillary: 280 mg/dL — ABNORMAL HIGH (ref 70–99)
Glucose-Capillary: 274 mg/dL — ABNORMAL HIGH (ref 70–99)
Glucose-Capillary: 231 mg/dL — ABNORMAL HIGH (ref 70–99)

## 2020-09-20 LAB — T3, FREE: T3, Free: 2.4 pg/mL (ref 2.0–4.4)

## 2020-09-20 LAB — MAGNESIUM: Magnesium: 1.9 mg/dL (ref 1.7–2.4)

## 2020-09-20 MED ORDER — INSULIN GLARGINE 100 UNIT/ML ~~LOC~~ SOLN
8.0000 [IU] | Freq: Every day | SUBCUTANEOUS | Status: DC
  Administered 2020-09-20 – 2020-09-21 (×2): 8 [IU] via SUBCUTANEOUS
  Filled 2020-09-20 (×2): qty 0.08

## 2020-09-20 MED ORDER — PNEUMOCOCCAL VAC POLYVALENT 25 MCG/0.5ML IJ INJ
0.5000 mL | INJECTION | INTRAMUSCULAR | Status: DC
  Filled 2020-09-20: qty 0.5

## 2020-09-20 NOTE — Progress Notes (Signed)
PROGRESS NOTE    Timothy Neal  EXB:[id number] DOB: 07-08-1941 DOA: 09/18/2020 PCP: [name], MD     Brief Narrative:  Timothy Neal is a 80 y.o.WM PMHx CAD, S/P CABG in 2000 and 1027, Chronic Systolic CHF, Cardiomyopathy, Paroxysmal Atrial fibrillation on Eliquis, S/P  ICD replacement 08/06/2020, HTN, CVA with some residual left-sided weakness, DM type II, CKD stage III, HLD,  Presented to the ED after mechanical fall due to progressive bilateral lower extremity weakness which has been ongoing for some time but getting worse over the past few days.  He has progressed from ambulation with cane, to walker, to rollator. Has been following with neurosurgery, was actually scheduled to get an MRI this upcoming Monday (today is Friday/Saturday).   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  Labs consistent with mild AKI on CKD stage III, mild normocytic anemia.  Unable to obtain MRI, per radiology MRI not available through the weekend for patients with implanted ICD.  ED spoke to neurosurgery, initiated Decadron every 6 hours and plan to get MRI ASAP.  Anticipate probably poor surgical candidate but unlikely to require emergent surgery as long as he remains stable, will follow patient closely   Subjective: 2/20 afebrile overnight, patient refused CPAP overnight.  A/O x4.  Sitting in chair stating that he was able to ambulate with assistance to the chair.  Assessment & Plan: Covid vaccination; vaccinated 3/3   Principal Problem:   Spinal cord compression ([place]) Active Problems:   Cardiomyopathy ([place])   Chronic kidney disease, stage 3 unspecified (HCC)   Chronic systolic heart failure (HCC)   Coronary artery disease   Diabetes mellitus, type II (HCC)   Dyslipidemia   History of gout   Obstructive sleep apnea (adult) (pediatric)   Paroxysmal atrial fibrillation (HCC)   S/P CABG (coronary artery bypass graft)   S/P ICD  (internal cardiac defibrillator) procedure   CKD (chronic kidney disease), stage IV ([place])   Controlled type 2 diabetes mellitus with hyperglycemia (HCC)   HLD (hyperlipidemia)   Hypothyroidism  Spinal cord compression/mechanical fall -Is, the rapidly progressive lower extremity weakness,  --> Continue Decadron 6 mg every 6 hours. --> Plan for MRI likely Monday, on record review from telephone encounter with his cardiologist 08/24/2020 he CAN have MRI with his current ICD  --> Neurosurgery consult in place; findings on MRI will determine future plan of action.  Patient very anxious  Paroxysmal atrial fibrillation -2/19 currently NSR -Apixaban 2.5 mg BID -Digoxin 62.5 mcg daily -S/p ICD placement -Imdur 30 mg daily -Sotalol 40 mg daily  Chronic systolic CHF -See A. fib -Strict in and out -428ml -Daily weight Filed Weights   09/19/20 0003 09/20/20 0500  Weight: 68 kg 68 kg    OSA  -CPAP per respiratory settings  CKD stage IV (baseline Cr 2.19; at [facility] 04/07/2020) Lab Results  Component Value Date   CREATININE 1.63 (H) 09/20/2020   CREATININE 1.64 (H) 09/19/2020   CREATININE 1.64 (H) 09/18/2020  -Improved from baseline  DM type II controlled with hyperglycemia -12/19 hemoglobin A1c= 6.7 -2/19 moderate SSI -2/20 Lantus 8 units daily  HLD -LDL= 91; goal LDL<70 -2/19 Lipitor 20 mg daily  Hypothyroidism -2/18 TSH= 6.5 elevated -2/19 US thyroid pending -2/20 will start Synthroid dependent upon findings of thyroid ultrasound.     DVT prophylaxis: Eliquis Code Status: Full Family Communication: 2/19 Sister at bedside for discussion of plan of care all questions answered  Status is: Inpatient    Dispo: The patient is from: Home              Anticipated d/c is to: Home              Anticipated d/c date is: 2/28              Patient currently unstable      Consultants:  Neurosurgery Dr. Arnoldo Morale    Procedures/Significant Events:  2/18 CT C-spineNo acute  cervical spine fracture. 2. Mild C5/C6 spondylosis with minimal bilateral symmetrical neural foraminal narrowing. 2/18 CT T-spineCoarse trabeculations affecting the T6 vertebral body, including involvement extending into the posterior elements. Posterior margin of the vertebral body appears to bow. The appearance is most consistent with hemangioma, but one that is expanding the bone. There could even be some extraosseous extension of tumor. Alternatively, this could be malignant involvement of the bone simulating hemangioma. In either case, there appears to be encroachment upon the spinal canal that could cause thoracic cord compression. Thoracic MRI would be suggested. 2/18CT L-spineNo acute or traumatic finding. Mild non-compressive disc bulges. Mild facet osteoarthritis. No significant stenosis of the canal or foramina. 2. Aortic atherosclerosis. 3. Right renal atrophy 2/19 CT chest abdomen pelvis W0 contrast;No acute findings within the chest, abdomen or pelvis. No soft tissue mass or adenopathy to suggest metastatic disease. 2. T6 vertebral body lesion is again identified. See report from 09/18/2020 CT thoracic spine without contrast for more details. 3. Aortic atherosclerosis. 4. Large hiatal hernia. 2/20 US thyroid pending  I have personally reviewed and interpreted all radiology studies and my findings are as above.  VENTILATOR SETTINGS:    Cultures   Antimicrobials:    Devices    LINES / TUBES:      Continuous Infusions: . sodium chloride       Objective: Vitals:   09/19/20 1930 09/20/20 0300 09/20/20 0500 09/20/20 0843  BP: (!) 145/67 124/68  120/60  Pulse: 73 71  69  Resp: 20 16  14   Temp: 97.9 F (36.6 C) 98.1 F (36.7 C)  98.1 F (36.7 C)  TempSrc: Oral Oral  Oral  SpO2: 97% 92%  97%  Weight:   68 kg   Height:        Intake/Output Summary (Last 24 hours) at 09/20/2020 1202 Last data filed at 09/20/2020 1016 Gross per 24 hour  Intake 103  ml  Output 800 ml  Net -697 ml   Filed Weights   09/19/20 0003 09/20/20 0500  Weight: 68 kg 68 kg    Examination:  General: No acute respiratory distress Eyes: negative scleral hemorrhage, negative anisocoria, negative icterus ENT: Negative Runny nose, negative gingival bleeding, Neck:  Negative scars, masses, torticollis, lymphadenopathy, JVD Lungs: Clear to auscultation bilaterally without wheezes or crackles Cardiovascular: Regular rate and rhythm without murmur gallop or rub normal S1 and S2 Abdomen: negative abdominal pain, nondistended, positive soft, bowel sounds, no rebound, no ascites, no appreciable mass Extremities: No significant cyanosis, clubbing, or edema bilateral lower extremities Skin: Negative rashes, lesions, ulcers Psychiatric:  Negative depression, negative anxiety, negative fatigue, negative mania  Central nervous system:  Cranial nerves II through XII intact, tongue/uvula midline, bilateral upper extremities muscle strength 5/5, bilateral lower extremity strength 1/5, sensation intact throughout bilateral upper extremity, sensation bilateral lower extremity decreased soft touch and pinprick  negative dysarthria, negative expressive aphasia, negative receptive aphasia.  .     Data Reviewed: Care during the described time interval was provided by  me .  I have reviewed this patient's available data, including medical history, events of note, physical examination, and all test results as part of my evaluation.  CBC: Recent Labs  Lab 09/18/20 1225 09/19/20 0957 09/20/20 0257  WBC 8.4 5.1 11.4*  NEUTROABS  --  4.6 10.1*  HGB 12.2* 12.4* 11.3*  HCT 39.2 37.1* 34.7*  MCV 90.3 88.1 88.3  PLT 172 167 408   Basic Metabolic Panel: Recent Labs  Lab 09/18/20 1225 09/19/20 0957 09/20/20 0257  NA 137 135 132*  K 4.7 4.5 4.8  CL 104 104 101  CO2 26 21* 18*  GLUCOSE 139* 398* 178*  BUN 24* 29* 32*  CREATININE 1.64* 1.64* 1.63*  CALCIUM 10.4* 9.8 10.0  MG   --  2.0 1.9  PHOS  --  3.6 3.2   GFR: Estimated Creatinine Clearance: 34.9 mL/min (A) (by C-G formula based on SCr of 1.63 mg/dL (H)). Liver Function Tests: Recent Labs  Lab 09/19/20 0957 09/20/20 0257  AST 28 32  ALT 26 24  ALKPHOS 41 32*  BILITOT 1.1 1.2  PROT 6.9 6.2*  ALBUMIN 3.2* 3.0*   No results for input(s): LIPASE, AMYLASE in the last 168 hours. No results for input(s): AMMONIA in the last 168 hours. Coagulation Profile: No results for input(s): INR, PROTIME in the last 168 hours. Cardiac Enzymes: No results for input(s): CKTOTAL, CKMB, CKMBINDEX, TROPONINI in the last 168 hours. BNP (last 3 results) No results for input(s): PROBNP in the last 8760 hours. HbA1C: Recent Labs    09/19/20 0957  HGBA1C 6.7*   CBG: Recent Labs  Lab 09/19/20 2211 09/19/20 2358 09/20/20 0358 09/20/20 0844 09/20/20 1138  GLUCAP 211* 197* 176* 172* 280*   Lipid Profile: Recent Labs    09/19/20 0957  CHOL 156  HDL 32*  LDLCALC 91  TRIG 166*  CHOLHDL 4.9   Thyroid Function Tests: Recent Labs    09/18/20 2146  TSH 6.588*   Anemia Panel: Recent Labs    09/18/20 2146  VITAMINB12 705   Sepsis Labs: No results for input(s): PROCALCITON, LATICACIDVEN in the last 168 hours.  Recent Results (from the past 240 hour(s))  Resp Panel by RT-PCR (Flu A&B, Covid) Nasopharyngeal Swab     Status: None   Collection Time: 09/18/20 11:16 PM   Specimen: Nasopharyngeal Swab; Nasopharyngeal(NP) swabs in vial transport medium  Result Value Ref Range Status   SARS Coronavirus 2 by RT PCR NEGATIVE NEGATIVE Final    Comment: (NOTE) SARS-CoV-2 target nucleic acids are NOT DETECTED.  The SARS-CoV-2 RNA is generally detectable in upper respiratory specimens during the acute phase of infection. The lowest concentration of SARS-CoV-2 viral copies this assay can detect is 138 copies/mL. A negative result does not preclude SARS-Cov-2 infection and should not be used as the sole basis for  treatment or other patient management decisions. A negative result may occur with  improper specimen collection/handling, submission of specimen other than nasopharyngeal swab, presence of viral mutation(s) within the areas targeted by this assay, and inadequate number of viral copies(<138 copies/mL). A negative result must be combined with clinical observations, patient history, and epidemiological information. The expected result is Negative.  Fact Sheet for Patients:  EntrepreneurPulse.com.au  Fact Sheet for Healthcare Providers:  IncredibleEmployment.be  This test is no t yet approved or cleared by the [place] FDA and  has been authorized for detection and/or diagnosis of SARS-CoV-2 by FDA under an Emergency Use Authorization (EUA). This EUA will remain  in effect (meaning this test can be used) for the duration of the COVID-19 declaration under Section 564(b)(1) of the Act, 21 U.S.C.section 360bbb-3(b)(1), unless the authorization is terminated  or revoked sooner.       Influenza A by PCR NEGATIVE NEGATIVE Final   Influenza B by PCR NEGATIVE NEGATIVE Final    Comment: (NOTE) The Xpert Xpress SARS-CoV-2/FLU/RSV plus assay is intended as an aid in the diagnosis of influenza from Nasopharyngeal swab specimens and should not be used as a sole basis for treatment. Nasal washings and aspirates are unacceptable for Xpert Xpress SARS-CoV-2/FLU/RSV testing.  Fact Sheet for Patients: EntrepreneurPulse.com.au  Fact Sheet for Healthcare Providers: IncredibleEmployment.be  This test is not yet approved or cleared by the [place] FDA and has been authorized for detection and/or diagnosis of SARS-CoV-2 by FDA under an Emergency Use Authorization (EUA). This EUA will remain in effect (meaning this test can be used) for the duration of the COVID-19 declaration under Section 564(b)(1) of the Act, 21  U.S.C. section 360bbb-3(b)(1), unless the authorization is terminated or revoked.  Performed at [facility], [place] [address], [place], [place] [address]          Radiology Studies: CT Head Wo Contrast  Result Date: 09/18/2020 CLINICAL DATA:  Bilateral lower extremity weakness, fell, anticoagulated EXAM: CT HEAD WITHOUT CONTRAST TECHNIQUE: Contiguous axial images were obtained from the base of the skull through the vertex without intravenous contrast. COMPARISON:  None. FINDINGS: Brain: Hypodensities within the right basal ganglia, bilateral periventricular white matter, and right frontal subcortical white matter are compatible with chronic ischemic changes. No signs of acute infarct or hemorrhage. The lateral ventricles and remaining midline structures are unremarkable. No acute extra-axial fluid collections. No mass effect. Vascular: Moderate atherosclerosis.  No hyperdense vessel. Skull: Normal. Negative for fracture or focal lesion. Sinuses/Orbits: There is opacification of the bilateral maxillary sinuses. Remaining paranasal sinuses are clear. Other: None. IMPRESSION: 1. No acute intracranial process. 2. Bilateral maxillary sinus disease. Electronically Signed   By: Randa Ngo M.D.   On: 09/18/2020 20:36   CT Cervical Spine Wo Contrast  Result Date: 09/18/2020 CLINICAL DATA:  Bilateral lower extremity weakness, fell this morning EXAM: CT CERVICAL SPINE WITHOUT CONTRAST TECHNIQUE: Multidetector CT imaging of the cervical spine was performed without intravenous contrast. Multiplanar CT image reconstructions were also generated. COMPARISON:  None. FINDINGS: Alignment: Alignment is anatomic. Skull base and vertebrae: No acute fracture. No primary bone lesion or focal pathologic process. Soft tissues and spinal canal: No prevertebral fluid or swelling. No visible canal hematoma. Extensive atherosclerosis at the carotid bifurcations. Disc levels: Bony fusion across the left C2/C3 facet  joints. Mild spondylosis at C5-6, with symmetrical bilateral neural foraminal encroachment. Upper chest: Airway is patent.  Lung apices are clear. Other: Reconstructed images demonstrate no additional findings. IMPRESSION: 1. No acute cervical spine fracture. 2. Mild C5/C6 spondylosis with minimal bilateral symmetrical neural foraminal narrowing. Electronically Signed   By: Randa Ngo M.D.   On: 09/18/2020 20:38   CT Thoracic Spine Wo Contrast  Result Date: 09/18/2020 CLINICAL DATA:  Falling. Bilateral leg weakness. Symptoms are worsening. EXAM: CT THORACIC SPINE WITHOUT CONTRAST TECHNIQUE: Multidetector CT images of the thoracic were obtained using the standard protocol without intravenous contrast. COMPARISON:  None. FINDINGS: Alignment: No significant malalignment. Mild scoliotic curvature convex to the right. Vertebrae: No thoracic region fracture. Coarse trabeculations affecting the T6 vertebral body. Posterior margin of the vertebral body appears to bow. The appearance is most consistent  with hemangioma, but 1 that is expanding the bone. There could even be some extraosseous extension of tumor. Thoracic MRI would be suggested. The lesion also extends into the posterior elements. Alternatively, this could be malignant involvement of the bone simulating hemangioma. Below that, no focal bone finding is present. Paraspinal and other soft tissues: Elevated left hemidiaphragm with left base volume loss. Aortic atherosclerotic calcification. Disc levels: No evidence of significant degenerative disc disease or facet arthropathy. IMPRESSION: Coarse trabeculations affecting the T6 vertebral body, including involvement extending into the posterior elements. Posterior margin of the vertebral body appears to bow. The appearance is most consistent with hemangioma, but one that is expanding the bone. There could even be some extraosseous extension of tumor. Alternatively, this could be malignant involvement of the  bone simulating hemangioma. In either case, there appears to be encroachment upon the spinal canal that could cause thoracic cord compression. Thoracic MRI would be suggested. Aortic Atherosclerosis (ICD10-I70.0). Electronically Signed   By: Nelson Chimes M.D.   On: 09/18/2020 20:42   CT Lumbar Spine Wo Contrast  Result Date: 09/18/2020 CLINICAL DATA:  Worsening lower extremity weakness.  Recent falls. EXAM: CT LUMBAR SPINE WITHOUT CONTRAST TECHNIQUE: Multidetector CT imaging of the lumbar spine was performed without intravenous contrast administration. Multiplanar CT image reconstructions were also generated. COMPARISON:  None. FINDINGS: Segmentation: 5 lumbar type vertebral bodies. Alignment: Normal Vertebrae: No fracture or primary bone lesion. Paraspinal and other soft tissues: Aortic atherosclerotic calcification. Some renal atrophy on the right. Disc levels: No significant lumbar region degenerative disease. Mild non-compressive disc bulges. Mild facet osteoarthritis. No significant stenosis of the canal or foramina. IMPRESSION: 1. No acute or traumatic finding. Mild non-compressive disc bulges. Mild facet osteoarthritis. No significant stenosis of the canal or foramina. 2. Aortic atherosclerosis. 3. Right renal atrophy. Aortic Atherosclerosis (ICD10-I70.0). Electronically Signed   By: Nelson Chimes M.D.   On: 09/18/2020 20:44   CT CHEST ABDOMEN PELVIS WO CONTRAST  Result Date: 09/20/2020 CLINICAL DATA:  History of melanoma. Patient presents with expansile lesion within the T6 vertebral body. Evaluate for metastatic disease. EXAM: CT CHEST, ABDOMEN AND PELVIS WITHOUT CONTRAST TECHNIQUE: Multidetector CT imaging of the chest, abdomen and pelvis was performed following the standard protocol without IV contrast. COMPARISON:  None. FINDINGS: CT CHEST FINDINGS Cardiovascular: Aortic atherosclerosis. Previous median sternotomy and CABG procedure. There is a left chest wall ICD with leads in the right atrial  appendage and right ventricle. Heart size is enlarged. No pericardial effusion. Mediastinum/Nodes: Large hiatal hernia. The trachea appears patent and is midline. Normal appearance of the esophagus. No enlarged axillary, supraclavicular, or mediastinal adenopathy. Hilar lymph nodes are suboptimally evaluated due to lack of IV contrast material. Lungs/Pleura: No pleural effusion. Pulmonary bleb versus pneumatocele noted within the superior segment of left lower lobe. Bilateral peripheral and lower lung zone predominant subpleural reticulation is identified, likely postinflammatory. Scattered calcified granulomas. No suspicious lung nodules identified. 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 T6 vertebral body lesion is again identified. See report from 09/18/2020 CT thoracic spine without contrast for more details. No additional focal bone lesions. CT ABDOMEN PELVIS FINDINGS Hepatobiliary: No focal liver abnormality is seen. No gallstones, gallbladder wall thickening, or biliary dilatation. Pancreas: Unremarkable. No pancreatic ductal dilatation or surrounding inflammatory changes. Spleen: Normal in size without focal abnormality. Adrenals/Urinary Tract: Normal appearance of the adrenal glands. Scarring and atrophy of the upper pole of right kidney noted. Cyst within the upper pole of the left kidney measures 1.6  cm. Bilateral renal vascular calcifications are identified. No hydronephrosis. Partially collapsed urinary bladder is unremarkable. Stomach/Bowel: Large hiatal hernia. No bowel wall thickening, inflammation, or distension. Diffuse colonic diverticulosis identified without acute inflammation. Vascular/Lymphatic: Aortic atherosclerosis. No aneurysm. No abdominopelvic adenopathy identified. Reproductive: Prostate is unremarkable. Other: No ascites or focal fluid collections. No ventral abdominal wall hernia. No acute or suspicious osseous findings. Musculoskeletal: No acute or  significant osseous findings. IMPRESSION: 1. No acute findings within the chest, abdomen or pelvis. No soft tissue mass or adenopathy to suggest metastatic disease. 2. T6 vertebral body lesion is again identified. See report from 09/18/2020 CT thoracic spine without contrast for more details. 3. Aortic atherosclerosis. 4. Large hiatal hernia. Aortic Atherosclerosis (ICD10-I70.0). Electronically Signed   By: Kerby Moors M.D.   On: 09/20/2020 05:48        Scheduled Meds: . apixaban  2.5 mg Oral BID  . dexamethasone (DECADRON) injection  6 mg Intravenous Q6H  . digoxin  62.5 mcg Oral Daily  . finasteride  5 mg Oral QHS  . insulin aspart  0-15 Units Subcutaneous Q4H  . isosorbide mononitrate  30 mg Oral QPM  . sodium chloride flush  3 mL Intravenous Q12H  . sotalol  40 mg Oral QPM  . tamsulosin  0.4 mg Oral Daily   Continuous Infusions: . sodium chloride       LOS: 1 day    Time spent:40 min    Deborh Pense, Geraldo Docker, MD [facility]   If 7PM-7AM, please contact night-coverage 09/20/2020, 12:02 PM

## 2020-09-20 NOTE — Plan of Care (Signed)
  Problem: Education: Goal: Knowledge of General Education information will improve Description: Including pain rating scale, medication(s)/side effects and non-pharmacologic comfort measures Outcome: Progressing   Problem: Clinical Measurements: Goal: Respiratory complications will improve Outcome: Progressing   Problem: Activity: Goal: Risk for activity intolerance will decrease Outcome: Progressing   Problem: Nutrition: Goal: Adequate nutrition will be maintained Outcome: Progressing   Problem: Coping: Goal: Level of anxiety will decrease Outcome: Progressing   Problem: Elimination: Goal: Will not experience complications related to bowel motility Outcome: Progressing Goal: Will not experience complications related to urinary retention Outcome: Progressing   Problem: Pain Managment: Goal: General experience of comfort will improve Outcome: Progressing   Problem: Safety: Goal: Ability to remain free from injury will improve Outcome: Progressing

## 2020-09-21 ENCOUNTER — Inpatient Hospital Stay (HOSPITAL_COMMUNITY): Payer: Medicare PPO

## 2020-09-21 LAB — CBC WITH DIFFERENTIAL/PLATELET
Abs Immature Granulocytes: 0.04 10*3/uL (ref 0.00–0.07)
Basophils Absolute: 0 10*3/uL (ref 0.0–0.1)
Basophils Relative: 0 %
Eosinophils Absolute: 0 10*3/uL (ref 0.0–0.5)
Eosinophils Relative: 0 %
HCT: 33.4 % — ABNORMAL LOW (ref 39.0–52.0)
Hemoglobin: 10.8 g/dL — ABNORMAL LOW (ref 13.0–17.0)
Immature Granulocytes: 0 %
Lymphocytes Relative: 6 %
Lymphs Abs: 0.6 10*3/uL — ABNORMAL LOW (ref 0.7–4.0)
MCH: 28.5 pg (ref 26.0–34.0)
MCHC: 32.3 g/dL (ref 30.0–36.0)
MCV: 88.1 fL (ref 80.0–100.0)
Monocytes Absolute: 0.4 10*3/uL (ref 0.1–1.0)
Monocytes Relative: 4 %
Neutro Abs: 9.2 10*3/uL — ABNORMAL HIGH (ref 1.7–7.7)
Neutrophils Relative %: 90 %
Platelets: 169 10*3/uL (ref 150–400)
RBC: 3.79 MIL/uL — ABNORMAL LOW (ref 4.22–5.81)
RDW: 15.9 % — ABNORMAL HIGH (ref 11.5–15.5)
WBC: 10.3 10*3/uL (ref 4.0–10.5)
nRBC: 0 % (ref 0.0–0.2)

## 2020-09-21 LAB — COMPREHENSIVE METABOLIC PANEL
ALT: 25 U/L (ref 0–44)
AST: 20 U/L (ref 15–41)
Albumin: 2.8 g/dL — ABNORMAL LOW (ref 3.5–5.0)
Alkaline Phosphatase: 29 U/L — ABNORMAL LOW (ref 38–126)
Anion gap: 10 (ref 5–15)
BUN: 40 mg/dL — ABNORMAL HIGH (ref 8–23)
CO2: 21 mmol/L — ABNORMAL LOW (ref 22–32)
Calcium: 10 mg/dL (ref 8.9–10.3)
Chloride: 102 mmol/L (ref 98–111)
Creatinine, Ser: 1.47 mg/dL — ABNORMAL HIGH (ref 0.61–1.24)
GFR, Estimated: 49 mL/min — ABNORMAL LOW (ref >60)
Glucose, Bld: 162 mg/dL — ABNORMAL HIGH (ref 70–99)
Potassium: 4.2 mmol/L (ref 3.5–5.1)
Sodium: 133 mmol/L — ABNORMAL LOW (ref 135–145)
Total Bilirubin: 0.4 mg/dL (ref 0.3–1.2)
Total Protein: 6 g/dL — ABNORMAL LOW (ref 6.5–8.1)

## 2020-09-21 LAB — GLUCOSE, CAPILLARY
Glucose-Capillary: 162 mg/dL — ABNORMAL HIGH (ref 70–99)
Glucose-Capillary: 178 mg/dL — ABNORMAL HIGH (ref 70–99)
Glucose-Capillary: 191 mg/dL — ABNORMAL HIGH (ref 70–99)
Glucose-Capillary: 204 mg/dL — ABNORMAL HIGH (ref 70–99)
Glucose-Capillary: 262 mg/dL — ABNORMAL HIGH (ref 70–99)
Glucose-Capillary: 267 mg/dL — ABNORMAL HIGH (ref 70–99)
Glucose-Capillary: 314 mg/dL — ABNORMAL HIGH (ref 70–99)

## 2020-09-21 LAB — PHOSPHORUS: Phosphorus: 2.9 mg/dL (ref 2.5–4.6)

## 2020-09-21 LAB — MAGNESIUM: Magnesium: 1.9 mg/dL (ref 1.7–2.4)

## 2020-09-21 MED ORDER — INSULIN ASPART 100 UNIT/ML ~~LOC~~ SOLN
8.0000 [IU] | Freq: Three times a day (TID) | SUBCUTANEOUS | Status: DC
  Administered 2020-09-22 – 2020-09-25 (×11): 8 [IU] via SUBCUTANEOUS

## 2020-09-21 MED ORDER — INSULIN GLARGINE 100 UNIT/ML ~~LOC~~ SOLN
14.0000 [IU] | Freq: Every day | SUBCUTANEOUS | Status: DC
  Administered 2020-09-22 – 2020-09-25 (×4): 14 [IU] via SUBCUTANEOUS
  Filled 2020-09-21 (×4): qty 0.14

## 2020-09-21 MED ORDER — LEVOTHYROXINE SODIUM 25 MCG PO TABS
25.0000 ug | ORAL_TABLET | Freq: Every day | ORAL | Status: DC
  Administered 2020-09-21 – 2020-09-22 (×2): 25 ug via ORAL
  Filled 2020-09-21 (×2): qty 1

## 2020-09-21 MED ORDER — SOTALOL HCL 120 MG PO TABS
120.0000 mg | ORAL_TABLET | ORAL | Status: DC
  Administered 2020-09-21 – 2020-09-24 (×4): 120 mg via ORAL
  Filled 2020-09-21 (×5): qty 1

## 2020-09-21 MED ORDER — GADOBUTROL 1 MMOL/ML IV SOLN
7.0000 mL | Freq: Once | INTRAVENOUS | Status: AC | PRN
  Administered 2020-09-21: 7 mL via INTRAVENOUS

## 2020-09-21 MED ORDER — ISOSORBIDE MONONITRATE ER 30 MG PO TB24
45.0000 mg | ORAL_TABLET | Freq: Every evening | ORAL | Status: DC
  Administered 2020-09-21 – 2020-09-24 (×4): 45 mg via ORAL
  Filled 2020-09-21 (×4): qty 2

## 2020-09-21 MED ORDER — SOTALOL HCL 80 MG PO TABS
80.0000 mg | ORAL_TABLET | Freq: Every day | ORAL | Status: DC
  Administered 2020-09-21 – 2020-09-25 (×5): 80 mg via ORAL
  Filled 2020-09-21 (×5): qty 1

## 2020-09-21 NOTE — Progress Notes (Signed)
Per order, changed patient's device settings to MRI SureScan to VOO [id number].  Patient is being monitored by SWOT RN during scan.  Will set patient back to previous settings after MRI and send transmission.

## 2020-09-21 NOTE — Progress Notes (Signed)
PROGRESS NOTE    Timothy Neal  YYQ:[id number] DOB: 1941-05-27 DOA: 09/18/2020 PCP: [name], MD     Brief Narrative:  Timothy Neal is a 80 y.o.WM PMHx CAD, S/P CABG in 2000 and 8889, Chronic Systolic CHF, Cardiomyopathy, Paroxysmal Atrial fibrillation on Eliquis, S/P  ICD replacement 08/06/2020, HTN, CVA with some residual left-sided weakness, DM type II, CKD stage III, HLD,  Presented to the ED after mechanical fall due to progressive bilateral lower extremity weakness which has been ongoing for some time but getting worse over the past few days.  He has progressed from ambulation with cane, to walker, to rollator. Has been following with neurosurgery, was actually scheduled to get an MRI this upcoming Monday (today is Friday/Saturday).   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  Labs consistent with mild AKI on CKD stage III, mild normocytic anemia.  Unable to obtain MRI, per radiology MRI not available through the weekend for patients with implanted ICD.  ED spoke to neurosurgery, initiated Decadron every 6 hours and plan to get MRI ASAP.  Anticipate probably poor surgical candidate but unlikely to require emergent surgery as long as he remains stable, will follow patient closely   Subjective: 2/21 afebrile overnight A/O x4, just returned from MRI of back.  Anxious to know results, but understands will not be read till later this evening.   Assessment & Plan: Covid vaccination; vaccinated 3/3   Principal Problem:   Spinal cord compression ([place]) Active Problems:   Cardiomyopathy ([place])   Chronic kidney disease, stage 3 unspecified (HCC)   Chronic systolic heart failure (HCC)   Coronary artery disease   Diabetes mellitus, type II (HCC)   Dyslipidemia   History of gout   Obstructive sleep apnea (adult) (pediatric)   Paroxysmal atrial fibrillation (HCC)   S/P CABG (coronary artery bypass graft)   S/P ICD  (internal cardiac defibrillator) procedure   CKD (chronic kidney disease), stage IV ([place])   Controlled type 2 diabetes mellitus with hyperglycemia (HCC)   HLD (hyperlipidemia)   Hypothyroidism  Spinal cord compression/mechanical fall -Is, the rapidly progressive lower extremity weakness,  --> Continue Decadron 6 mg every 6 hours. --> Plan for MRI likely Monday, on record review from telephone encounter with his cardiologist 08/24/2020 he CAN have MRI with his current ICD  --> Neurosurgery consult in place; findings on MRI will determine future plan of action.  Patient very anxious -2/21 patient just returned from spinal cord MRI will await findings.  Paroxysmal atrial fibrillation -2/19 currently NSR -Apixaban 2.5 mg BID -Digoxin 62.5 mcg daily -S/p ICD placement -Sotalol 80 mg 1000 daily -Sotalol 120 mg@2200  daily -2/21 increase Imdur 45 mg daily  Chronic systolic CHF -See A. fib -Strict in and out -2.2 L -Daily weight Filed Weights   09/19/20 0003 09/20/20 0500  Weight: 68 kg 68 kg    OSA  -CPAP per respiratory settings  CKD stage IV (baseline Cr 2.19; at [facility] 04/07/2020) Lab Results  Component Value Date   CREATININE 1.47 (H) 09/21/2020   CREATININE 1.63 (H) 09/20/2020   CREATININE 1.64 (H) 09/19/2020   CREATININE 1.64 (H) 09/18/2020  -Improved from baseline  DM type II controlled with hyperglycemia -12/19 hemoglobin A1c= 6.7 -2/19 moderate SSI -2/22 increase Lantus 14 units daily -2/22 NovoLog 8 units qac   HLD -LDL= 91; goal LDL<70 -2/19 Lipitor 20 mg daily  Hypothyroidism/RIGHT thyroid nodule -2/18 TSH= 6.5 elevated -2/19 US thyroid; Small  subpathologic nodule see results below  -Free T3 still pending -2/21 Synthroid 25 mcg daily; recheck TSH in 8 weeks.     DVT prophylaxis: Eliquis Code Status: Full Family Communication: 2/19 Sister at bedside for discussion of plan of care all questions answered Status is: Inpatient    Dispo: The patient  is from: Home              Anticipated d/c is to: Home              Anticipated d/c date is: 2/28              Patient currently unstable      Consultants:  Neurosurgery Dr. Arnoldo Morale    Procedures/Significant Events:  2/18 CT C-spineNo acute cervical spine fracture. 2. Mild C5/C6 spondylosis with minimal bilateral symmetrical neural foraminal narrowing. 2/18 CT T-spineCoarse trabeculations affecting the T6 vertebral body, including involvement extending into the posterior elements. Posterior margin of the vertebral body appears to bow. The appearance is most consistent with hemangioma, but one that is expanding the bone. There could even be some extraosseous extension of tumor. Alternatively, this could be malignant involvement of the bone simulating hemangioma. In either case, there appears to be encroachment upon the spinal canal that could cause thoracic cord compression. Thoracic MRI would be suggested. 2/18CT L-spineNo acute or traumatic finding. Mild non-compressive disc bulges. Mild facet osteoarthritis. No significant stenosis of the canal or foramina. 2. Aortic atherosclerosis. 3. Right renal atrophy 2/19 CT chest abdomen pelvis W0 contrast;No acute findings within the chest, abdomen or pelvis. No soft tissue mass or adenopathy to suggest metastatic disease. 2. T6 vertebral body lesion is again identified. See report from 09/18/2020 CT thoracic spine without contrast for more details. 3. Aortic atherosclerosis. 4. Large hiatal hernia. 2/20 US thyroid Relatively small thyroid gland with a single right-sided thyroid nodule that does not meet criteria for follow-up or fine-needle aspiration.  I have personally reviewed and interpreted all radiology studies and my findings are as above.  VENTILATOR SETTINGS:    Cultures   Antimicrobials:    Devices    LINES / TUBES:      Continuous Infusions: . sodium chloride       Objective: Vitals:    09/20/20 0843 09/20/20 1628 09/20/20 1942 09/21/20 0927  BP: 120/60 (!) 143/84 (!) 141/84 (!) 153/58  Pulse: 69 85 96 83  Resp: 14 16 18 17   Temp: 98.1 F (36.7 C) 97.7 F (36.5 C) 98.3 F (36.8 C) 97.7 F (36.5 C)  TempSrc: Oral Oral  Oral  SpO2: 97% 100% 97% 90%  Weight:      Height:        Intake/Output Summary (Last 24 hours) at 09/21/2020 4235 Last data filed at 09/21/2020 0000 Gross per 24 hour  Intake 243 ml  Output 1800 ml  Net -1557 ml   Filed Weights   09/19/20 0003 09/20/20 0500  Weight: 68 kg 68 kg    Examination:  General: No acute respiratory distress Eyes: negative scleral hemorrhage, negative anisocoria, negative icterus ENT: Negative Runny nose, negative gingival bleeding, Neck:  Negative scars, masses, torticollis, lymphadenopathy, JVD Lungs: Clear to auscultation bilaterally without wheezes or crackles Cardiovascular: Regular rate and rhythm without murmur gallop or rub normal S1 and S2 Abdomen: negative abdominal pain, nondistended, positive soft, bowel sounds, no rebound, no ascites, no appreciable mass Extremities: No significant cyanosis, clubbing, or edema bilateral lower extremities Skin: Negative rashes, lesions, ulcers Psychiatric:  Negative depression, negative anxiety, negative  fatigue, negative mania  Central nervous system:  Cranial nerves II through XII intact, tongue/uvula midline, bilateral upper extremities muscle strength 5/5, bilateral lower extremity strength 1/5, sensation intact throughout bilateral upper extremity, sensation bilateral lower extremity decreased soft touch and pinprick  negative dysarthria, negative expressive aphasia, negative receptive aphasia.  .     Data Reviewed: Care during the described time interval was provided by me .  I have reviewed this patient's available data, including medical history, events of note, physical examination, and all test results as part of my evaluation.  CBC: Recent Labs  Lab  09/18/20 1225 09/19/20 0957 09/20/20 0257 09/21/20 0203  WBC 8.4 5.1 11.4* 10.3  NEUTROABS  --  4.6 10.1* 9.2*  HGB 12.2* 12.4* 11.3* 10.8*  HCT 39.2 37.1* 34.7* 33.4*  MCV 90.3 88.1 88.3 88.1  PLT 172 167 173 237   Basic Metabolic Panel: Recent Labs  Lab 09/18/20 1225 09/19/20 0957 09/20/20 0257 09/21/20 0203  NA 137 135 132* 133*  K 4.7 4.5 4.8 4.2  CL 104 104 101 102  CO2 26 21* 18* 21*  GLUCOSE 139* 398* 178* 162*  BUN 24* 29* 32* 40*  CREATININE 1.64* 1.64* 1.63* 1.47*  CALCIUM 10.4* 9.8 10.0 10.0  MG  --  2.0 1.9 1.9  PHOS  --  3.6 3.2 2.9   GFR: Estimated Creatinine Clearance: 38.7 mL/min (A) (by C-G formula based on SCr of 1.47 mg/dL (H)). Liver Function Tests: Recent Labs  Lab 09/19/20 0957 09/20/20 0257 09/21/20 0203  AST 28 32 20  ALT 26 24 25   ALKPHOS 41 32* 29*  BILITOT 1.1 1.2 0.4  PROT 6.9 6.2* 6.0*  ALBUMIN 3.2* 3.0* 2.8*   No results for input(s): LIPASE, AMYLASE in the last 168 hours. No results for input(s): AMMONIA in the last 168 hours. Coagulation Profile: No results for input(s): INR, PROTIME in the last 168 hours. Cardiac Enzymes: No results for input(s): CKTOTAL, CKMB, CKMBINDEX, TROPONINI in the last 168 hours. BNP (last 3 results) No results for input(s): PROBNP in the last 8760 hours. HbA1C: Recent Labs    09/19/20 0957  HGBA1C 6.7*   CBG: Recent Labs  Lab 09/20/20 1627 09/20/20 1939 09/21/20 0026 09/21/20 0419 09/21/20 0821  GLUCAP 274* 231* 204* 162* 191*   Lipid Profile: Recent Labs    09/19/20 0957  CHOL 156  HDL 32*  LDLCALC 91  TRIG 166*  CHOLHDL 4.9   Thyroid Function Tests: Recent Labs    09/18/20 2146 09/19/20 0957  TSH 6.588*  --   T3FREE  --  2.4   Anemia Panel: Recent Labs    09/18/20 2146  VITAMINB12 705   Sepsis Labs: No results for input(s): PROCALCITON, LATICACIDVEN in the last 168 hours.  Recent Results (from the past 240 hour(s))  Resp Panel by RT-PCR (Flu A&B, Covid)  Nasopharyngeal Swab     Status: None   Collection Time: 09/18/20 11:16 PM   Specimen: Nasopharyngeal Swab; Nasopharyngeal(NP) swabs in vial transport medium  Result Value Ref Range Status   SARS Coronavirus 2 by RT PCR NEGATIVE NEGATIVE Final    Comment: (NOTE) SARS-CoV-2 target nucleic acids are NOT DETECTED.  The SARS-CoV-2 RNA is generally detectable in upper respiratory specimens during the acute phase of infection. The lowest concentration of SARS-CoV-2 viral copies this assay can detect is 138 copies/mL. A negative result does not preclude SARS-Cov-2 infection and should not be used as the sole basis for treatment or other patient management decisions. A  negative result may occur with  improper specimen collection/handling, submission of specimen other than nasopharyngeal swab, presence of viral mutation(s) within the areas targeted by this assay, and inadequate number of viral copies(<138 copies/mL). A negative result must be combined with clinical observations, patient history, and epidemiological information. The expected result is Negative.  Fact Sheet for Patients:  EntrepreneurPulse.com.au  Fact Sheet for Healthcare Providers:  IncredibleEmployment.be  This test is no t yet approved or cleared by the [place] FDA and  has been authorized for detection and/or diagnosis of SARS-CoV-2 by FDA under an Emergency Use Authorization (EUA). This EUA will remain  in effect (meaning this test can be used) for the duration of the COVID-19 declaration under Section 564(b)(1) of the Act, 21 U.S.C.section 360bbb-3(b)(1), unless the authorization is terminated  or revoked sooner.       Influenza A by PCR NEGATIVE NEGATIVE Final   Influenza B by PCR NEGATIVE NEGATIVE Final    Comment: (NOTE) The Xpert Xpress SARS-CoV-2/FLU/RSV plus assay is intended as an aid in the diagnosis of influenza from Nasopharyngeal swab specimens and should not be  used as a sole basis for treatment. Nasal washings and aspirates are unacceptable for Xpert Xpress SARS-CoV-2/FLU/RSV testing.  Fact Sheet for Patients: EntrepreneurPulse.com.au  Fact Sheet for Healthcare Providers: IncredibleEmployment.be  This test is not yet approved or cleared by the [place] FDA and has been authorized for detection and/or diagnosis of SARS-CoV-2 by FDA under an Emergency Use Authorization (EUA). This EUA will remain in effect (meaning this test can be used) for the duration of the COVID-19 declaration under Section 564(b)(1) of the Act, 21 U.S.C. section 360bbb-3(b)(1), unless the authorization is terminated or revoked.  Performed at [facility], [place] [address], [place], [place] [address]          Radiology Studies: US THYROID  Result Date: 09/20/2020 CLINICAL DATA:  Hypothyroidism EXAM: THYROID ULTRASOUND TECHNIQUE: Ultrasound examination of the thyroid gland and adjacent soft tissues was performed. COMPARISON:  None. FINDINGS: Parenchymal Echotexture: Normal Isthmus: 0.2 cm Right lobe: 2.7 x 1.2 x 1.3 cm Left lobe: 2.6 x 0.9 x 1 cm _________________________________________________________ Estimated total number of nodules >/= 1 cm: 1 Number of spongiform nodules >/=  2 cm not described below (TR1): 0 Number of mixed cystic and solid nodules >/= 1.5 cm not described below (TR2): 0 _________________________________________________________ There is a mixed cystic and solid TR 2 thyroid nodule in the right mid to superior thyroid gland that does not meet criteria for follow-up or fine-needle aspiration. There is a small calcification in the left thyroid gland. IMPRESSION: Relatively small thyroid gland with a single right-sided thyroid nodule that does not meet criteria for follow-up or fine-needle aspiration. The above is in keeping with the ACR TI-RADS recommendations - J Am Coll Radiol 2017;14:587-595. Electronically  Signed   By: Constance Holster M.D.   On: 09/20/2020 16:31   CT CHEST ABDOMEN PELVIS WO CONTRAST  Result Date: 09/20/2020 CLINICAL DATA:  History of melanoma. Patient presents with expansile lesion within the T6 vertebral body. Evaluate for metastatic disease. EXAM: CT CHEST, ABDOMEN AND PELVIS WITHOUT CONTRAST TECHNIQUE: Multidetector CT imaging of the chest, abdomen and pelvis was performed following the standard protocol without IV contrast. COMPARISON:  None. FINDINGS: CT CHEST FINDINGS Cardiovascular: Aortic atherosclerosis. Previous median sternotomy and CABG procedure. There is a left chest wall ICD with leads in the right atrial appendage and right ventricle. Heart size is enlarged. No pericardial effusion. Mediastinum/Nodes: Large hiatal  hernia. The trachea appears patent and is midline. Normal appearance of the esophagus. No enlarged axillary, supraclavicular, or mediastinal adenopathy. Hilar lymph nodes are suboptimally evaluated due to lack of IV contrast material. Lungs/Pleura: No pleural effusion. Pulmonary bleb versus pneumatocele noted within the superior segment of left lower lobe. Bilateral peripheral and lower lung zone predominant subpleural reticulation is identified, likely postinflammatory. Scattered calcified granulomas. No suspicious lung nodules identified. 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 T6 vertebral body lesion is again identified. See report from 09/18/2020 CT thoracic spine without contrast for more details. No additional focal bone lesions. CT ABDOMEN PELVIS FINDINGS Hepatobiliary: No focal liver abnormality is seen. No gallstones, gallbladder wall thickening, or biliary dilatation. Pancreas: Unremarkable. No pancreatic ductal dilatation or surrounding inflammatory changes. Spleen: Normal in size without focal abnormality. Adrenals/Urinary Tract: Normal appearance of the adrenal glands. Scarring and atrophy of the upper pole of right  kidney noted. Cyst within the upper pole of the left kidney measures 1.6 cm. Bilateral renal vascular calcifications are identified. No hydronephrosis. Partially collapsed urinary bladder is unremarkable. Stomach/Bowel: Large hiatal hernia. No bowel wall thickening, inflammation, or distension. Diffuse colonic diverticulosis identified without acute inflammation. Vascular/Lymphatic: Aortic atherosclerosis. No aneurysm. No abdominopelvic adenopathy identified. Reproductive: Prostate is unremarkable. Other: No ascites or focal fluid collections. No ventral abdominal wall hernia. No acute or suspicious osseous findings. Musculoskeletal: No acute or significant osseous findings. IMPRESSION: 1. No acute findings within the chest, abdomen or pelvis. No soft tissue mass or adenopathy to suggest metastatic disease. 2. T6 vertebral body lesion is again identified. See report from 09/18/2020 CT thoracic spine without contrast for more details. 3. Aortic atherosclerosis. 4. Large hiatal hernia. Aortic Atherosclerosis (ICD10-I70.0). Electronically Signed   By: Kerby Moors M.D.   On: 09/20/2020 05:48        Scheduled Meds: . apixaban  2.5 mg Oral BID  . dexamethasone (DECADRON) injection  6 mg Intravenous Q6H  . digoxin  62.5 mcg Oral Daily  . finasteride  5 mg Oral QHS  . insulin aspart  0-15 Units Subcutaneous Q4H  . insulin glargine  8 Units Subcutaneous Daily  . isosorbide mononitrate  30 mg Oral QPM  . pneumococcal 23 valent vaccine  0.5 mL Intramuscular Tomorrow-1000  . sodium chloride flush  3 mL Intravenous Q12H  . sotalol  120 mg Oral Daily  . sotalol  80 mg Oral Daily  . tamsulosin  0.4 mg Oral Daily   Continuous Infusions: . sodium chloride       LOS: 2 days    Time spent:40 min    Nuri Larmer, Geraldo Docker, MD [facility]   If 7PM-7AM, please contact night-coverage 09/21/2020, 9:33 AM

## 2020-09-21 NOTE — Progress Notes (Signed)
Informed of MRI for today.   Device system confirmed to be MRI conditional, with implant date > 6 weeks ago and no evidence of abandoned or epicardial leads in review of most recent CXR Interrogation from today reviewed, pt is currently AS (Atrial fib) - VP at ~70 bpm Change device settings for MRI to VOO at 85 bpm  Tachy-therapies to off if applicable.  Program device back to pre-MRI settings after completion of exam.  Annamaria Helling  09/21/2020 12:27 PM

## 2020-09-21 NOTE — Progress Notes (Signed)
Pt refused CPAP for tonight. Patient stated he does not wear one at home and does not want to wear one here. RT will monitor as needed.

## 2020-09-21 NOTE — Progress Notes (Signed)
Subjective: The patient is alert and pleasant.  He has ambulated with a walker.  His legs feel stronger.  Objective: Vital signs in last 24 hours: Temp:  [97.7 F (36.5 C)-98.3 F (36.8 C)] 97.7 F (36.5 C) (02/21 0927) Pulse Rate:  [83-96] 83 (02/21 0927) Resp:  [16-18] 17 (02/21 0927) BP: (141-153)/(58-84) 153/58 (02/21 0927) SpO2:  [90 %-100 %] 90 % (02/21 0927) Estimated body mass index is 23.49 kg/m as calculated from the following:   Height as of this encounter: 5\' 7"  (1.702 m).   Weight as of this encounter: 68 kg.   Intake/Output from previous day: 02/20 0701 - 02/21 0700 In: 343 [P.O.:340; I.V.:3] Out: 2000 [Urine:2000] Intake/Output this shift: No intake/output data recorded.  Physical exam the patient is alert and oriented.  His spinal gastrocnemius and dorsiflexor strength are grossly normal.  Lab Results: Recent Labs    09/20/20 0257 09/21/20 0203  WBC 11.4* 10.3  HGB 11.3* 10.8*  HCT 34.7* 33.4*  PLT 173 169   BMET Recent Labs    09/20/20 0257 09/21/20 0203  NA 132* 133*  K 4.8 4.2  CL 101 102  CO2 18* 21*  GLUCOSE 178* 162*  BUN 32* 40*  CREATININE 1.63* 1.47*  CALCIUM 10.0 10.0    Studies/Results: DG CHEST PORT 1 VIEW  Result Date: 09/21/2020 CLINICAL DATA:  Pacemaker.  CHF. EXAM: PORTABLE CHEST 1 VIEW COMPARISON:  09/19/2020 FINDINGS: Multi lead left chest wall implanted cardiac device. Post CABG changes. Hiatal hernia. Cardiomegaly. Atherosclerotic calcification of the aortic knob. No focal airspace consolidation, pleural effusion, or pneumothorax. Known T6 lesion is not well seen radiographically. Remaining osseous structures are within normal limits. IMPRESSION: Cardiomegaly. No acute cardiopulmonary disease. Electronically Signed   By: Davina Poke D.O.   On: 09/21/2020 10:36   US THYROID  Result Date: 09/20/2020 CLINICAL DATA:  Hypothyroidism EXAM: THYROID ULTRASOUND TECHNIQUE: Ultrasound examination of the thyroid gland and  adjacent soft tissues was performed. COMPARISON:  None. FINDINGS: Parenchymal Echotexture: Normal Isthmus: 0.2 cm Right lobe: 2.7 x 1.2 x 1.3 cm Left lobe: 2.6 x 0.9 x 1 cm _________________________________________________________ Estimated total number of nodules >/= 1 cm: 1 Number of spongiform nodules >/=  2 cm not described below (TR1): 0 Number of mixed cystic and solid nodules >/= 1.5 cm not described below (TR2): 0 _________________________________________________________ There is a mixed cystic and solid TR 2 thyroid nodule in the right mid to superior thyroid gland that does not meet criteria for follow-up or fine-needle aspiration. There is a small calcification in the left thyroid gland. IMPRESSION: Relatively small thyroid gland with a single right-sided thyroid nodule that does not meet criteria for follow-up or fine-needle aspiration. The above is in keeping with the ACR TI-RADS recommendations - J Am Coll Radiol 2017;14:587-595. Electronically Signed   By: Constance Holster M.D.   On: 09/20/2020 16:31   CT CHEST ABDOMEN PELVIS WO CONTRAST  Result Date: 09/20/2020 CLINICAL DATA:  History of melanoma. Patient presents with expansile lesion within the T6 vertebral body. Evaluate for metastatic disease. EXAM: CT CHEST, ABDOMEN AND PELVIS WITHOUT CONTRAST TECHNIQUE: Multidetector CT imaging of the chest, abdomen and pelvis was performed following the standard protocol without IV contrast. COMPARISON:  None. FINDINGS: CT CHEST FINDINGS Cardiovascular: Aortic atherosclerosis. Previous median sternotomy and CABG procedure. There is a left chest wall ICD with leads in the right atrial appendage and right ventricle. Heart size is enlarged. No pericardial effusion. Mediastinum/Nodes: Large hiatal hernia. The trachea appears patent and  is midline. Normal appearance of the esophagus. No enlarged axillary, supraclavicular, or mediastinal adenopathy. Hilar lymph nodes are suboptimally evaluated due to lack of  IV contrast material. Lungs/Pleura: No pleural effusion. Pulmonary bleb versus pneumatocele noted within the superior segment of left lower lobe. Bilateral peripheral and lower lung zone predominant subpleural reticulation is identified, likely postinflammatory. Scattered calcified granulomas. No suspicious lung nodules identified. 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 T6 vertebral body lesion is again identified. See report from 09/18/2020 CT thoracic spine without contrast for more details. No additional focal bone lesions. CT ABDOMEN PELVIS FINDINGS Hepatobiliary: No focal liver abnormality is seen. No gallstones, gallbladder wall thickening, or biliary dilatation. Pancreas: Unremarkable. No pancreatic ductal dilatation or surrounding inflammatory changes. Spleen: Normal in size without focal abnormality. Adrenals/Urinary Tract: Normal appearance of the adrenal glands. Scarring and atrophy of the upper pole of right kidney noted. Cyst within the upper pole of the left kidney measures 1.6 cm. Bilateral renal vascular calcifications are identified. No hydronephrosis. Partially collapsed urinary bladder is unremarkable. Stomach/Bowel: Large hiatal hernia. No bowel wall thickening, inflammation, or distension. Diffuse colonic diverticulosis identified without acute inflammation. Vascular/Lymphatic: Aortic atherosclerosis. No aneurysm. No abdominopelvic adenopathy identified. Reproductive: Prostate is unremarkable. Other: No ascites or focal fluid collections. No ventral abdominal wall hernia. No acute or suspicious osseous findings. Musculoskeletal: No acute or significant osseous findings. IMPRESSION: 1. No acute findings within the chest, abdomen or pelvis. No soft tissue mass or adenopathy to suggest metastatic disease. 2. T6 vertebral body lesion is again identified. See report from 09/18/2020 CT thoracic spine without contrast for more details. 3. Aortic atherosclerosis. 4.  Large hiatal hernia. Aortic Atherosclerosis (ICD10-I70.0). Electronically Signed   By: Kerby Moors M.D.   On: 09/20/2020 05:48    Assessment/Plan: Thoracic spine tumor: I have again discussed the situation with the patient.  CT of the chest abdomen pelvis are negative for metastatic disease.  We are awaiting his thoracic MRI.  We have again discussed that if this is a large hemangioma, he may not be a good candidate for surgery/resection given his medical comorbidities.  LOS: 2 days     Ophelia Charter 09/21/2020, 12:33 PM

## 2020-09-22 ENCOUNTER — Ambulatory Visit
Admission: RE | Admit: 2020-09-22 | Discharge: 2020-09-22 | Disposition: A | Discharge status: Home / Self Care | Payer: Medicare PPO | Source: Ambulatory Visit | Attending: Radiation Oncology | Admitting: Radiation Oncology

## 2020-09-22 ENCOUNTER — Other Ambulatory Visit: Payer: Self-pay | Admitting: Radiation Therapy

## 2020-09-22 DIAGNOSIS — G9520 Unspecified cord compression: Secondary | ICD-10-CM

## 2020-09-22 DIAGNOSIS — Z923 Personal history of irradiation: Secondary | ICD-10-CM | POA: Insufficient documentation

## 2020-09-22 DIAGNOSIS — D1809 Hemangioma of other sites: Secondary | ICD-10-CM | POA: Insufficient documentation

## 2020-09-22 LAB — COMPREHENSIVE METABOLIC PANEL
ALT: 53 U/L — ABNORMAL HIGH (ref 0–44)
AST: 36 U/L (ref 15–41)
Albumin: 2.9 g/dL — ABNORMAL LOW (ref 3.5–5.0)
Alkaline Phosphatase: 34 U/L — ABNORMAL LOW (ref 38–126)
Anion gap: 8 (ref 5–15)
BUN: 41 mg/dL — ABNORMAL HIGH (ref 8–23)
CO2: 23 mmol/L (ref 22–32)
Calcium: 10.1 mg/dL (ref 8.9–10.3)
Chloride: 102 mmol/L (ref 98–111)
Creatinine, Ser: 1.43 mg/dL — ABNORMAL HIGH (ref 0.61–1.24)
GFR, Estimated: 50 mL/min — ABNORMAL LOW (ref >60)
Glucose, Bld: 226 mg/dL — ABNORMAL HIGH (ref 70–99)
Potassium: 4.5 mmol/L (ref 3.5–5.1)
Sodium: 133 mmol/L — ABNORMAL LOW (ref 135–145)
Total Bilirubin: 0.7 mg/dL (ref 0.3–1.2)
Total Protein: 6.2 g/dL — ABNORMAL LOW (ref 6.5–8.1)

## 2020-09-22 LAB — GLUCOSE, CAPILLARY
Glucose-Capillary: 185 mg/dL — ABNORMAL HIGH (ref 70–99)
Glucose-Capillary: 155 mg/dL — ABNORMAL HIGH (ref 70–99)
Glucose-Capillary: 242 mg/dL — ABNORMAL HIGH (ref 70–99)
Glucose-Capillary: 176 mg/dL — ABNORMAL HIGH (ref 70–99)
Glucose-Capillary: 179 mg/dL — ABNORMAL HIGH (ref 70–99)

## 2020-09-22 LAB — CBC WITH DIFFERENTIAL/PLATELET
Abs Immature Granulocytes: 0.05 10*3/uL (ref 0.00–0.07)
Basophils Absolute: 0 10*3/uL (ref 0.0–0.1)
Basophils Relative: 0 %
Eosinophils Absolute: 0 10*3/uL (ref 0.0–0.5)
Eosinophils Relative: 0 %
HCT: 36.6 % — ABNORMAL LOW (ref 39.0–52.0)
Hemoglobin: 11.7 g/dL — ABNORMAL LOW (ref 13.0–17.0)
Immature Granulocytes: 1 %
Lymphocytes Relative: 7 %
Lymphs Abs: 0.6 10*3/uL — ABNORMAL LOW (ref 0.7–4.0)
MCH: 28.1 pg (ref 26.0–34.0)
MCHC: 32 g/dL (ref 30.0–36.0)
MCV: 88 fL (ref 80.0–100.0)
Monocytes Absolute: 0.4 10*3/uL (ref 0.1–1.0)
Monocytes Relative: 4 %
Neutro Abs: 7.9 10*3/uL — ABNORMAL HIGH (ref 1.7–7.7)
Neutrophils Relative %: 88 %
Platelets: 180 10*3/uL (ref 150–400)
RBC: 4.16 MIL/uL — ABNORMAL LOW (ref 4.22–5.81)
RDW: 15.9 % — ABNORMAL HIGH (ref 11.5–15.5)
WBC: 9 10*3/uL (ref 4.0–10.5)
nRBC: 0 % (ref 0.0–0.2)

## 2020-09-22 LAB — PHOSPHORUS: Phosphorus: 3.1 mg/dL (ref 2.5–4.6)

## 2020-09-22 LAB — MAGNESIUM: Magnesium: 2 mg/dL (ref 1.7–2.4)

## 2020-09-22 MED ORDER — INSULIN ASPART 100 UNIT/ML ~~LOC~~ SOLN
0.0000 [IU] | Freq: Three times a day (TID) | SUBCUTANEOUS | Status: DC
  Administered 2020-09-22 (×2): 3 [IU] via SUBCUTANEOUS
  Administered 2020-09-23: 11 [IU] via SUBCUTANEOUS
  Administered 2020-09-23 (×2): 5 [IU] via SUBCUTANEOUS
  Administered 2020-09-23: 3 [IU] via SUBCUTANEOUS
  Administered 2020-09-24 (×2): 5 [IU] via SUBCUTANEOUS
  Administered 2020-09-24: 3 [IU] via SUBCUTANEOUS
  Administered 2020-09-24: 5 [IU] via SUBCUTANEOUS
  Administered 2020-09-25: 3 [IU] via SUBCUTANEOUS
  Administered 2020-09-25: 5 [IU] via SUBCUTANEOUS

## 2020-09-22 NOTE — TOC Progression Note (Addendum)
Transition of Care [facility]) - Progression Note    Patient Details  Name: Timothy Neal MRN: [id number] Date of Birth: 08/24/1940  Transition of Care [facility]) CM/SW [place], [place] Phone Number: 09/22/2020, 11:17 AM  Clinical Narrative:    CSW received a phone call from Philippa Sicks, [phone], [place] from [facility].  Rollene Fare would like to be kept up dated on pt's disposition planning.She plans on visiting pt this week.  TOC will continue to assist with disposition planning.        Expected Discharge Plan and Services                                                 Social Determinants of Health (SDOH) Interventions    Readmission Risk Interventions No flowsheet data found.

## 2020-09-22 NOTE — Progress Notes (Signed)
Subjective: The patient is alert and pleasant.  He is in no apparent distress.  Objective: Vital signs in last 24 hours: Temp:  [97.6 F (36.4 C)-97.9 F (36.6 C)] 97.6 F (36.4 C) (02/22 0747) Pulse Rate:  [69-85] 85 (02/22 0747) Resp:  [16-18] 17 (02/22 0747) BP: (145-159)/(58-93) 153/93 (02/22 0747) SpO2:  [90 %-100 %] 100 % (02/22 0747) Estimated body mass index is 23.49 kg/m as calculated from the following:   Height as of this encounter: 5\' 7"  (1.702 m).   Weight as of this encounter: 68 kg.   Intake/Output from previous day: No intake/output data recorded. Intake/Output this shift: No intake/output data recorded.  Physical exam the patient is alert and oriented.  His lower extremity strength is grossly normal.  I have reviewed the patient's thoracic MRI performed yesterday.  It demonstrates a T6 vertebral body, pedicle and epidural lesion.  Do not see any other significant lesions.  There is moderate spinal cord compression.  Lab Results: Recent Labs    09/21/20 0203 09/22/20 0354  WBC 10.3 9.0  HGB 10.8* 11.7*  HCT 33.4* 36.6*  PLT 169 180   BMET Recent Labs    09/21/20 0203 09/22/20 0354  NA 133* 133*  K 4.2 4.5  CL 102 102  CO2 21* 23  GLUCOSE 162* 226*  BUN 40* 41*  CREATININE 1.47* 1.43*  CALCIUM 10.0 10.1    Studies/Results: MR THORACIC SPINE W WO CONTRAST  Result Date: 09/21/2020 CLINICAL DATA:  Thoracic spinal lesion. Rule out malignancy or hemangioma EXAM: MRI THORACIC WITHOUT AND WITH CONTRAST TECHNIQUE: Multiplanar and multiecho pulse sequences of the thoracic spine were obtained without and with intravenous contrast. CONTRAST:  35mL GADAVIST GADOBUTROL 1 MMOL/ML IV SOLN COMPARISON:  CT thoracic spine 09/18/2020 FINDINGS: Alignment:  Normal Vertebrae: Negative for fracture Extensive abnormality throughout the T6 vertebral body which is diffusely low signal on T1 and high signal on T2. Bony trabeculations are seen throughout the vertebral body  on CT. There is extension of this process into the left T6 pedicle and also extending into the left T6 transverse process and lamina. There is extension into the spinal canal with enhancing epidural soft tissue surrounding the cord and causing cord compression. Much of the bone lesion also shows enhancement. There is a second smaller lesion in the T3 vertebral body anteriorly on the right measuring 1 cm. This is low signal on T1 and also shows mild enhancement. Cord: Cord compression at T6 due to epidural soft tissue mass. There is mild cord edema at this level. Remainder of the cord signal is normal. Paraspinal and other soft tissues: No paraspinous mass or fluid collection. 3 cm subpleural cyst left lower lobe posteriorly. Mild left lower lobe airspace disease. Disc levels: Negative for thoracic disc protrusion. Mild disc space narrowing and disc desiccation at multiple levels. IMPRESSION: Extensive abnormality of the T6 vertebral body extending into the posterior elements on the left. There is also extensive epidural soft tissue process surrounding the cord and causing cord compression at the T6 level. Mild associated cord edema. Bony trabeculations are present within the vertebral body on CT without aggressive bony destruction. No associated fat content in the lesion. 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 Malignancy including metastatic disease and myeloma also considered. Second 1 cm lesion T3 vertebral body with a similar appearance on MRI. Electronically Signed   By: Franchot Gallo M.D.   On: 09/21/2020 13:59   DG CHEST PORT 1 VIEW  Result  Date: 09/21/2020 CLINICAL DATA:  Pacemaker.  CHF. EXAM: PORTABLE CHEST 1 VIEW COMPARISON:  09/19/2020 FINDINGS: Multi lead left chest wall implanted cardiac device. Post CABG changes. Hiatal hernia. Cardiomegaly. Atherosclerotic calcification of the aortic knob. No focal airspace consolidation, pleural effusion, or  pneumothorax. Known T6 lesion is not well seen radiographically. Remaining osseous structures are within normal limits. IMPRESSION: Cardiomegaly. No acute cardiopulmonary disease. Electronically Signed   By: Davina Poke D.O.   On: 09/21/2020 10:36   US THYROID  Result Date: 09/20/2020 CLINICAL DATA:  Hypothyroidism EXAM: THYROID ULTRASOUND TECHNIQUE: Ultrasound examination of the thyroid gland and adjacent soft tissues was performed. COMPARISON:  None. FINDINGS: Parenchymal Echotexture: Normal Isthmus: 0.2 cm Right lobe: 2.7 x 1.2 x 1.3 cm Left lobe: 2.6 x 0.9 x 1 cm _________________________________________________________ Estimated total number of nodules >/= 1 cm: 1 Number of spongiform nodules >/=  2 cm not described below (TR1): 0 Number of mixed cystic and solid nodules >/= 1.5 cm not described below (TR2): 0 _________________________________________________________ There is a mixed cystic and solid TR 2 thyroid nodule in the right mid to superior thyroid gland that does not meet criteria for follow-up or fine-needle aspiration. There is a small calcification in the left thyroid gland. IMPRESSION: Relatively small thyroid gland with a single right-sided thyroid nodule that does not meet criteria for follow-up or fine-needle aspiration. The above is in keeping with the ACR TI-RADS recommendations - J Am Coll Radiol 2017;14:587-595. Electronically Signed   By: Constance Holster M.D.   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  There does not seem to be any other evidence of metastatic disease.  We discussed the various treatment options for a presumed vertebral hemangioma.  We discussed the surgical option of embolization and surgery but I have explained that surgery would be risky at his age and with his heart issues.  Surgery for this type of tumor can be associated with significant blood loss.  I think under the circumstances his  best option is radiation.  I will asked the radiation doctors to see him.  LOS: 3 days     Ophelia Charter 09/22/2020, 8:17 AM

## 2020-09-22 NOTE — Plan of Care (Signed)

## 2020-09-22 NOTE — Care Management Important Message (Signed)
Important Message  Patient Details  Name: Timothy Neal MRN: [id number] Date of Birth: Dec 24, 1940   Medicare Important Message Given:  Yes     Iris Bratton 09/22/2020, 2:30 PM

## 2020-09-22 NOTE — Consult Note (Signed)
Radiation Oncology         [phone] ________________________________  Name: Timothy Neal        MRN: [id number]  Date of Service: 09/22/20 DOB: 06-23-1941  AS:[id number], Arnette Norris, MD     REFERRING PHYSICIAN: Dr. Arnoldo Morale  DIAGNOSIS: The primary encounter diagnosis was Spinal cord compression [facility]). 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   HISTORY OF PRESENT ILLNESS: Timothy Neal is a 80 y.o. male seen at the request of Dr. 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 The patient has had progressive lower extremity weakness going from the use of a cane, to a walker, to a roller walker in the last month and a half approximately. He interestingly also developed urinary retention in the fall of 2021 and has been self catheterizing every 6 hours at home since about November 2021. He presented after noticing progressive weakness in his lower extremities and imaging in the ED and a CT work up including the head, full spine, and CAP showed no primary lesion in the CAP, but showed a lesion in the T spine at T6. 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 He is not a candidate for surgical resection due to the vascular nature of the lesion and his cardiac risks. He's contacted to discuss alternative options of radiotherapy.    PREVIOUS RADIATION THERAPY: No   PAST MEDICAL HISTORY:  Past Medical History:  Diagnosis Date  . Cardiomyopathy ([place]) 08/26/2014  . Chronic kidney disease, stage 3 unspecified ([place]) 04/20/2016  . Chronic systolic heart failure ([place]) 09/23/2014  . Coronary artery  disease 04/20/2016  . Diabetes mellitus, type II ([place]) 08/26/2014   Last Assessment & Plan:  Formatting of this note might be different from the original. Home regimen.  . Dyslipidemia 04/20/2016  . History of gout 04/20/2016  . Obstructive sleep apnea (adult) (pediatric) 04/20/2016   Formatting of this note might be different from the original. does not use CPAP  . Paroxysmal atrial fibrillation ([place]) 04/20/2016   Last Assessment & Plan:  Formatting of this note might be different from the original. Continue sotalol, carvedilol. Hold Apixaban until PCP follow up, if h/H remains stable apixaban to be resumed.  . 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 statin, beta-blocker, ACE inhibitor  . 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 MDT BiV ICD implanted 09/23/14  . Spinal cord compression ([place]) 09/19/2020       PAST SURGICAL HISTORY: Past Surgical History:  Procedure Laterality Date  . CORONARY ARTERY BYPASS GRAFT    . HERNIA REPAIR      FAMILY HISTORY:  Family History  Problem Relation Age of Onset  . Heart attack Mother   . Diabetes Mother   . Heart attack Father   . Stroke Father   . Diabetes Brother      SOCIAL HISTORY:  reports that he has quit smoking. He has never used smokeless tobacco. The patient is divorced and lives in [place] at  [address]. He has lived in [place], [place] in [place] prior to electing to go to [place] [employer]. He's a retired Chief Financial Officer. He has three adult sons, two of which he has relationships with.   ALLERGIES: Patient has no known allergies.   MEDICATIONS:  Current Facility-Administered Medications  Medication Dose Route Frequency Provider Last Rate Last Admin  . 0.9 %  sodium chloride infusion  250 mL Intravenous PRN Emeterio Reeve, DO      .  apixaban Arne Cleveland) tablet 2.5 mg  2.5 mg Oral BID Allie Bossier, MD   2.5 mg at 09/22/20 0825  . dexamethasone (DECADRON) injection 6 mg  6 mg Intravenous Q6H Jeralyn Bennett, MD   6 mg at 09/22/20 1214  . digoxin (LANOXIN) tablet 62.5 mcg  62.5 mcg Oral Daily Allie Bossier, MD   62.5 mcg at 09/22/20 0825  . finasteride (PROSCAR) tablet 5 mg  5 mg Oral QHS Allie Bossier, MD   5 mg at 09/21/20 2118  . insulin aspart (novoLOG) injection 0-15 Units  0-15 Units Subcutaneous TID AC & HS Ghimire, Kuber, MD      . insulin aspart (novoLOG) injection 8 Units  8 Units Subcutaneous TID WC Allie Bossier, MD   8 Units at 09/22/20 1214  . insulin glargine (LANTUS) injection 14 Units  14 Units Subcutaneous Daily Allie Bossier, MD   14 Units at 09/22/20 [phone]  . isosorbide mononitrate (IMDUR) 24 hr tablet 45 mg  45 mg Oral QPM Allie Bossier, MD   45 mg at 09/21/20 1807  . pneumococcal 23 valent vaccine (PNEUMOVAX-23) injection 0.5 mL  0.5 mL Intramuscular Tomorrow-1000 Allie Bossier, MD      . sodium chloride flush (NS) 0.9 % injection 3 mL  3 mL Intravenous Q12H Emeterio Reeve, DO   3 mL at 09/22/20 0830  . sodium chloride flush (NS) 0.9 % injection 3 mL  3 mL Intravenous PRN Emeterio Reeve, DO      . sotalol (BETAPACE) tablet 120 mg  120 mg Oral Daily Allie Bossier, MD   120 mg at 09/21/20 2118  . sotalol (BETAPACE) tablet 80 mg  80 mg Oral Daily Allie Bossier, MD   80 mg at 09/22/20 9604  . tamsulosin (FLOMAX) capsule 0.4 mg  0.4 mg Oral Daily Allie Bossier, MD   0.4 mg at 09/22/20 5409     REVIEW OF SYSTEMS: On review of systems, the patient reports that he is doing pretty well since starting steroids. He reports he has had to self cath for urinary retention for about 3-4 months and sees Dr. Royce Macadamia in [place]. He reports he is feeling more strength in his lower extremities since his admission and has been able to walk, but reports he has numbness from the mid chest down. He  does not feel pain but can discriminate light touch and pressure. He reports some constipation and difficulty passing stool in the past few months as well. He denies any chest pain, shortness of breath, fevers, or chills. No other complaints are noted.      PHYSICAL EXAM:  Wt Readings from Last 3 Encounters:  09/20/20 150 lb (68 kg)   Temp Readings from Last 3 Encounters:  09/22/20 97.6 F (36.4 C) (Oral)   BP Readings from Last 3 Encounters:  09/22/20 (!) 153/93   Pulse Readings from Last 3 Encounters:  09/22/20 85   Pain Assessment Pain Score: 0-No pain/10  Unable to  assess due to encounter type.   ECOG = 0  0 - Asymptomatic (Fully active, able to carry on all predisease activities without restriction)  1 - Symptomatic but completely ambulatory (Restricted in physically strenuous activity but ambulatory and able to carry out work of a light or sedentary nature. For example, light housework, office work)  2 - Symptomatic, <50% in bed during the day (Ambulatory and capable of all self care but unable to carry out any work activities. Up and about more than 50% of waking hours)  3 - Symptomatic, >50% in bed, but not bedbound (Capable of only limited self-care, confined to bed or chair 50% or more of waking hours)  4 - Bedbound (Completely disabled. Cannot carry on any self-care. Totally confined to bed or chair)  5 - Death   Eustace Pen MM, Creech RH, Tormey DC, et al. [phone]). "Toxicity and response criteria of the [facility] Group". [place] Oncol. 5 (6): 649-55    LABORATORY DATA:  Lab Results  Component Value Date   WBC 9.0 09/22/2020   HGB 11.7 (L) 09/22/2020   HCT 36.6 (L) 09/22/2020   MCV 88.0 09/22/2020   PLT 180 09/22/2020   Lab Results  Component Value Date   NA 133 (L) 09/22/2020   K 4.5 09/22/2020   CL 102 09/22/2020   CO2 23 09/22/2020   Lab Results  Component Value Date   ALT 53 (H) 09/22/2020   AST 36 09/22/2020   ALKPHOS 34 (L)  09/22/2020   BILITOT 0.7 09/22/2020      RADIOGRAPHY: CT Head Wo Contrast  Result Date: 09/18/2020 CLINICAL DATA:  Bilateral lower extremity weakness, fell, anticoagulated EXAM: CT HEAD WITHOUT CONTRAST TECHNIQUE: Contiguous axial images were obtained from the base of the skull through the vertex without intravenous contrast. COMPARISON:  None. FINDINGS: Brain: Hypodensities within the right basal ganglia, bilateral periventricular white matter, and right frontal subcortical white matter are compatible with chronic ischemic changes. No signs of acute infarct or hemorrhage. The lateral ventricles and remaining midline structures are unremarkable. No acute extra-axial fluid collections. No mass effect. Vascular: Moderate atherosclerosis.  No hyperdense vessel. Skull: Normal. Negative for fracture or focal lesion. Sinuses/Orbits: There is opacification of the bilateral maxillary sinuses. Remaining paranasal sinuses are clear. Other: None. IMPRESSION: 1. No acute intracranial process. 2. Bilateral maxillary sinus disease. Electronically Signed   By: Randa Ngo M.D.   On: 09/18/2020 20:36   CT Cervical Spine Wo Contrast  Result Date: 09/18/2020 CLINICAL DATA:  Bilateral lower extremity weakness, fell this morning EXAM: CT CERVICAL SPINE WITHOUT CONTRAST TECHNIQUE: Multidetector CT imaging of the cervical spine was performed without intravenous contrast. Multiplanar CT image reconstructions were also generated. COMPARISON:  None. FINDINGS: Alignment: Alignment is anatomic. Skull base and vertebrae: No acute fracture. No primary bone lesion or focal pathologic process. Soft tissues and spinal canal: No prevertebral fluid or swelling. No visible canal hematoma. Extensive atherosclerosis at the carotid bifurcations. Disc levels: Bony fusion across the left C2/C3 facet joints. Mild spondylosis at C5-6, with symmetrical bilateral neural foraminal encroachment. Upper chest: Airway is patent.  Lung apices are  clear. Other: Reconstructed images demonstrate no additional findings. IMPRESSION: 1. No acute cervical spine fracture. 2. Mild C5/C6 spondylosis with minimal bilateral symmetrical neural foraminal narrowing. Electronically Signed   By: Randa Ngo M.D.   On: 09/18/2020 20:38   CT Thoracic Spine Wo Contrast  Result Date: 09/18/2020 CLINICAL DATA:  Falling. Bilateral leg weakness. Symptoms are worsening. EXAM:  CT THORACIC SPINE WITHOUT CONTRAST TECHNIQUE: Multidetector CT images of the thoracic were obtained using the standard protocol without intravenous contrast. COMPARISON:  None. FINDINGS: Alignment: No significant malalignment. Mild scoliotic curvature convex to the right. Vertebrae: No thoracic region fracture. Coarse trabeculations affecting the T6 vertebral body. Posterior margin of the vertebral body appears to bow. The appearance is most consistent with hemangioma, but 1 that is expanding the bone. There could even be some extraosseous extension of tumor. Thoracic MRI would be suggested. The lesion also extends into the posterior elements. Alternatively, this could be malignant involvement of the bone simulating hemangioma. Below that, no focal bone finding is present. Paraspinal and other soft tissues: Elevated left hemidiaphragm with left base volume loss. Aortic atherosclerotic calcification. Disc levels: No evidence of significant degenerative disc disease or facet arthropathy. IMPRESSION: Coarse trabeculations affecting the T6 vertebral body, including involvement extending into the posterior elements. Posterior margin of the vertebral body appears to bow. The appearance is most consistent with hemangioma, but one that is expanding the bone. There could even be some extraosseous extension of tumor. Alternatively, this could be malignant involvement of the bone simulating hemangioma. In either case, there appears to be encroachment upon the spinal canal that could cause thoracic cord compression.  Thoracic MRI would be suggested. Aortic Atherosclerosis (ICD10-I70.0). Electronically Signed   By: Nelson Chimes M.D.   On: 09/18/2020 20:42   CT Lumbar Spine Wo Contrast  Result Date: 09/18/2020 CLINICAL DATA:  Worsening lower extremity weakness.  Recent falls. EXAM: CT LUMBAR SPINE WITHOUT CONTRAST TECHNIQUE: Multidetector CT imaging of the lumbar spine was performed without intravenous contrast administration. Multiplanar CT image reconstructions were also generated. COMPARISON:  None. FINDINGS: Segmentation: 5 lumbar type vertebral bodies. Alignment: Normal Vertebrae: No fracture or primary bone lesion. Paraspinal and other soft tissues: Aortic atherosclerotic calcification. Some renal atrophy on the right. Disc levels: No significant lumbar region degenerative disease. Mild non-compressive disc bulges. Mild facet osteoarthritis. No significant stenosis of the canal or foramina. IMPRESSION: 1. No acute or traumatic finding. Mild non-compressive disc bulges. Mild facet osteoarthritis. No significant stenosis of the canal or foramina. 2. Aortic atherosclerosis. 3. Right renal atrophy. Aortic Atherosclerosis (ICD10-I70.0). Electronically Signed   By: Nelson Chimes M.D.   On: 09/18/2020 20:44   MR THORACIC SPINE W WO CONTRAST  Result Date: 09/21/2020 CLINICAL DATA:  Thoracic spinal lesion. Rule out malignancy or hemangioma EXAM: MRI THORACIC WITHOUT AND WITH CONTRAST TECHNIQUE: Multiplanar and multiecho pulse sequences of the thoracic spine were obtained without and with intravenous contrast. CONTRAST:  25mL GADAVIST GADOBUTROL 1 MMOL/ML IV SOLN COMPARISON:  CT thoracic spine 09/18/2020 FINDINGS: Alignment:  Normal Vertebrae: Negative for fracture Extensive abnormality throughout the T6 vertebral body which is diffusely low signal on T1 and high signal on T2. Bony trabeculations are seen throughout the vertebral body on CT. There is extension of this process into the left T6 pedicle and also extending into the  left T6 transverse process and lamina. There is extension into the spinal canal with enhancing epidural soft tissue surrounding the cord and causing cord compression. Much of the bone lesion also shows enhancement. There is a second smaller lesion in the T3 vertebral body anteriorly on the right measuring 1 cm. This is low signal on T1 and also shows mild enhancement. Cord: Cord compression at T6 due to epidural soft tissue mass. There is mild cord edema at this level. Remainder of the cord signal is normal. Paraspinal and other soft tissues: No paraspinous  mass or fluid collection. 3 cm subpleural cyst left lower lobe posteriorly. Mild left lower lobe airspace disease. Disc levels: Negative for thoracic disc protrusion. Mild disc space narrowing and disc desiccation at multiple levels. IMPRESSION: Extensive abnormality of the T6 vertebral body extending into the posterior elements on the left. There is also extensive epidural soft tissue process surrounding the cord and causing cord compression at the T6 level. Mild associated cord edema. Bony trabeculations are present within the vertebral body on CT without aggressive bony destruction. No associated fat content in the lesion. 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 Malignancy including metastatic disease and myeloma also considered. Second 1 cm lesion T3 vertebral body with a similar appearance on MRI. Electronically Signed   By: Franchot Gallo M.D.   On: 09/21/2020 13:59   DG CHEST PORT 1 VIEW  Result Date: 09/21/2020 CLINICAL DATA:  Pacemaker.  CHF. EXAM: PORTABLE CHEST 1 VIEW COMPARISON:  09/19/2020 FINDINGS: Multi lead left chest wall implanted cardiac device. Post CABG changes. Hiatal hernia. Cardiomegaly. Atherosclerotic calcification of the aortic knob. No focal airspace consolidation, pleural effusion, or pneumothorax. Known T6 lesion is not well seen radiographically. Remaining osseous structures are within  normal limits. IMPRESSION: Cardiomegaly. No acute cardiopulmonary disease. Electronically Signed   By: Davina Poke D.O.   On: 09/21/2020 10:36   US THYROID  Result Date: 09/20/2020 CLINICAL DATA:  Hypothyroidism EXAM: THYROID ULTRASOUND TECHNIQUE: Ultrasound examination of the thyroid gland and adjacent soft tissues was performed. COMPARISON:  None. FINDINGS: Parenchymal Echotexture: Normal Isthmus: 0.2 cm Right lobe: 2.7 x 1.2 x 1.3 cm Left lobe: 2.6 x 0.9 x 1 cm _________________________________________________________ Estimated total number of nodules >/= 1 cm: 1 Number of spongiform nodules >/=  2 cm not described below (TR1): 0 Number of mixed cystic and solid nodules >/= 1.5 cm not described below (TR2): 0 _________________________________________________________ There is a mixed cystic and solid TR 2 thyroid nodule in the right mid to superior thyroid gland that does not meet criteria for follow-up or fine-needle aspiration. There is a small calcification in the left thyroid gland. IMPRESSION: Relatively small thyroid gland with a single right-sided thyroid nodule that does not meet criteria for follow-up or fine-needle aspiration. The above is in keeping with the ACR TI-RADS recommendations - J Am Coll Radiol 2017;14:587-595. Electronically Signed   By: Constance Holster M.D.   On: 09/20/2020 16:31   CT CHEST ABDOMEN PELVIS WO CONTRAST  Result Date: 09/20/2020 CLINICAL DATA:  History of melanoma. Patient presents with expansile lesion within the T6 vertebral body. Evaluate for metastatic disease. EXAM: CT CHEST, ABDOMEN AND PELVIS WITHOUT CONTRAST TECHNIQUE: Multidetector CT imaging of the chest, abdomen and pelvis was performed following the standard protocol without IV contrast. COMPARISON:  None. FINDINGS: CT CHEST FINDINGS Cardiovascular: Aortic atherosclerosis. Previous median sternotomy and CABG procedure. There is a left chest wall ICD with leads in the right atrial appendage and right  ventricle. Heart size is enlarged. No pericardial effusion. Mediastinum/Nodes: Large hiatal hernia. The trachea appears patent and is midline. Normal appearance of the esophagus. No enlarged axillary, supraclavicular, or mediastinal adenopathy. Hilar lymph nodes are suboptimally evaluated due to lack of IV contrast material. Lungs/Pleura: No pleural effusion. Pulmonary bleb versus pneumatocele noted within the superior segment of left lower lobe. Bilateral peripheral and lower lung zone predominant subpleural reticulation is identified, likely postinflammatory. Scattered calcified granulomas. No suspicious lung nodules identified. 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 T6 vertebral body lesion  is again identified. See report from 09/18/2020 CT thoracic spine without contrast for more details. No additional focal bone lesions. CT ABDOMEN PELVIS FINDINGS Hepatobiliary: No focal liver abnormality is seen. No gallstones, gallbladder wall thickening, or biliary dilatation. Pancreas: Unremarkable. No pancreatic ductal dilatation or surrounding inflammatory changes. Spleen: Normal in size without focal abnormality. Adrenals/Urinary Tract: Normal appearance of the adrenal glands. Scarring and atrophy of the upper pole of right kidney noted. Cyst within the upper pole of the left kidney measures 1.6 cm. Bilateral renal vascular calcifications are identified. No hydronephrosis. Partially collapsed urinary bladder is unremarkable. Stomach/Bowel: Large hiatal hernia. No bowel wall thickening, inflammation, or distension. Diffuse colonic diverticulosis identified without acute inflammation. Vascular/Lymphatic: Aortic atherosclerosis. No aneurysm. No abdominopelvic adenopathy identified. Reproductive: Prostate is unremarkable. Other: No ascites or focal fluid collections. No ventral abdominal wall hernia. No acute or suspicious osseous findings. Musculoskeletal: No acute or significant osseous  findings. IMPRESSION: 1. No acute findings within the chest, abdomen or pelvis. No soft tissue mass or adenopathy to suggest metastatic disease. 2. T6 vertebral body lesion is again identified. See report from 09/18/2020 CT thoracic spine without contrast for more details. 3. Aortic atherosclerosis. 4. Large hiatal hernia. Aortic Atherosclerosis (ICD10-I70.0). Electronically Signed   By: Kerby Moors M.D.   On: 09/20/2020 05:48       IMPRESSION/PLAN: 1. 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 Dr. Lisbeth Renshaw has reviewed the patient's case and we discussed the rarity of a hemangioma causing these findings. That being said, he is not a candidate for decompression due to the risks of bleeding and high cardiac risks of proceeding. Dr. Lisbeth Renshaw is in agreement that he would be a candidate for conventional radiotherapy due to the cord being enveloped in this vascular tumor.  We discussed the risks, benefits, short, and long term effects of radiotherapy, as well as the curative intent, and the patient is interested in proceeding. Dr. Lisbeth Renshaw recommends a course of 5 weeks of radiotherapy to the T6 lesion. We will coordinate for transfer to [facility] to proceed with simulation tomorrow and to start radiation on Thursday. The patient will sign written consent at that time to proceed.   In a visit lasting 70 minutes, greater than 50% of the time was spent by phone and in floor time discussing the patient's condition, in preparation for the discussion, and coordinating the patient's care.     Carola Rhine, [facility]   **Disclaimer: This note was dictated with voice recognition software. Similar sounding words can inadvertently be transcribed and this note may contain transcription errors which may not have been corrected upon publication of note.**

## 2020-09-22 NOTE — Progress Notes (Signed)
PROGRESS NOTE    Timothy Neal  HWE:[id number] DOB: [birth year] DOA: 09/18/2020 PCP: [name], MD    Brief Narrative:  80 year old gentleman with history of coronary artery disease status post CABG, chronic systolic heart failure, paroxysmal A. fib on Eliquis, CHF with ICD in place, hypertension, stroke with some residual left-sided weakness, type 2 diabetes, hyperlipidemia presented to the emergency room after fall at home and also noted to be progressive bilateral lower extremity weakness ongoing for more than 3 months and worse over last few days.  He has progressed from ambulation with a cane to walker to Rollator.  He was following up with neurosurgery as outpatient and was planning to have a spine MRI done.  He came to ER because of fall. At the emergency room, he was found to have bilateral weakness of the legs.  CT spine notable for lesion at T6 most likely hemangioma.  Patient was seen by neurosurgery, started on Decadron and admitted to the hospital.   Assessment & Plan:   Principal Problem:   Spinal cord compression (HCC) Active Problems:   Cardiomyopathy ([place])   Chronic kidney disease, stage 3 unspecified (HCC)   Chronic systolic heart failure (HCC)   Coronary artery disease   Diabetes mellitus, type II (HCC)   Dyslipidemia   History of gout   Obstructive sleep apnea (adult) (pediatric)   Paroxysmal atrial fibrillation (HCC)   S/P CABG (coronary artery bypass graft)   S/P ICD (internal cardiac defibrillator) procedure   CKD (chronic kidney disease), stage IV (HCC)   Controlled type 2 diabetes mellitus with hyperglycemia (HCC)   HLD (hyperlipidemia)   Hypothyroidism  Acute on chronic spinal cord compression due to suspected hemangioma: Patient presented with acute on chronic bilateral lower extremity weakness.  No evidence of acute cauda equina on presentation. Initial CT scan with T6 lesion unclear. MRI 2/21 with extensive abnormality at the level of T6, epidural  soft tissue causing cord compression, mild cord edema consistent with possible hemangioma. Patient was treated with dexamethasone with clinical improvement. Seen and followed by neurosurgery.  Poor candidate for surgical correction given extensive underlying medical issues and cardiac comorbidities. Continue to work with PT OT.  Look forward to going home with PT OT. As per neurosurgery recommendation, will be seen by radiation oncology today.  They recommend radiation to the back. If arranged radiation outpatient, he will be able to go back to his senior living arrangements and go to radiation treatments.  Paroxysmal Afib: Currently sinus rhythm Therapeutic on Eliquis S/p ICD  Sotalol 80 mg, 120 mg twice a day.  Patient is also on Imdur.  Chronic systolic heart failure status post ICD: Patient is fairly clinically euvolemic.  Obstructive sleep apnea: He is supposed to use CPAP.  He does not use it.  There is no benefit of using it in hospital when he is not going to use it at home.  CKD stage IV: Renal functions at about his baseline.  Type 2 diabetes, uncontrolled with hyperglycemia: A1c 6.7.  Patient is on [place] and glipizide at home.  On high-dose steroids with anticipated worsening blood sugars.  Currently on increasing dose of insulin, will continue to titrate.  Hopefully he will go home on tapering dose of steroids.  Hyperlipidemia: On Lipitor.  Continue.  Hypothyroidism: TSH is 6.5.  Patient with no evidence of clinical hypothyroidism.  No indication for treatment.  Will discontinue Synthroid.  Thyroid ultrasound is not needed.  Chronic urinary retention: Patient straight cath at home 4  times a day.  Stable.  On Proscar.  Patient is medically stabilizing.  Patient will be seen by radiation oncology today, discharge plan pending radiation plans.     DVT prophylaxis: apixaban (ELIQUIS) tablet 2.5 mg Start: 09/19/20 1015 apixaban (ELIQUIS) tablet 2.5 mg   Code Status: Full  code Family Communication: None.  Patient communicating with family. Disposition Plan: Status is: Inpatient  Remains inpatient appropriate because:Inpatient level of care appropriate due to severity of illness   Dispo: The patient is from: Home              Anticipated d/c is to: Home              Anticipated d/c date is: 2 days              Patient currently is not medically stable to d/c.   Difficult to place patient No         Consultants:   Neurosurgery  Radiation oncology  Procedures:   None  Antimicrobials:   None   Subjective: Patient was seen and examined.  No overnight events.  He was very excited that he has more strength on both of his legs.  He is looking forward to go back home and can keep up all appointments.  Patient states that his senior living arrangements can help him with transportation.  Denies any pain.  Objective: Vitals:   09/21/20 1623 09/21/20 2102 09/22/20 0435 09/22/20 0747  BP: (!) 159/75 (!) 154/78 (!) 145/67 (!) 153/93  Pulse: 69 70 84 85  Resp: 17 18 16 17   Temp: 97.6 F (36.4 C) 97.9 F (36.6 C) 97.9 F (36.6 C) 97.6 F (36.4 C)  TempSrc: Oral Oral Oral Oral  SpO2: 93% 92% 99% 100%  Weight:      Height:        Intake/Output Summary (Last 24 hours) at 09/22/2020 1331 Last data filed at 09/22/2020 1030 Gross per 24 hour  Intake -  Output 1000 ml  Net -1000 ml   Filed Weights   09/19/20 0003 09/20/20 0500  Weight: 68 kg 68 kg    Examination:  General exam: Appears calm and comfortable  Patient is on room air.  Not in any distress. Respiratory system: Clear to auscultation. Respiratory effort normal. Cardiovascular system: S1 & S2 heard, RRR.  Left precordial AICD in place.  Gastrointestinal system: Abdomen is nondistended, soft and nontender. No organomegaly or masses felt. Normal bowel sounds heard. Central nervous system: Alert and oriented. No focal neurological deficits. Extremities:  Patient has generalized  weakness on both legs, however has equal motor and sensory power.    Data Reviewed: I have personally reviewed following labs and imaging studies  CBC: Recent Labs  Lab 09/18/20 1225 09/19/20 0957 09/20/20 0257 09/21/20 0203 09/22/20 0354  WBC 8.4 5.1 11.4* 10.3 9.0  NEUTROABS  --  4.6 10.1* 9.2* 7.9*  HGB 12.2* 12.4* 11.3* 10.8* 11.7*  HCT 39.2 37.1* 34.7* 33.4* 36.6*  MCV 90.3 88.1 88.3 88.1 88.0  PLT 172 167 173 169 528   Basic Metabolic Panel: Recent Labs  Lab 09/18/20 1225 09/19/20 0957 09/20/20 0257 09/21/20 0203 09/22/20 0354  NA 137 135 132* 133* 133*  K 4.7 4.5 4.8 4.2 4.5  CL 104 104 101 102 102  CO2 26 21* 18* 21* 23  GLUCOSE 139* 398* 178* 162* 226*  BUN 24* 29* 32* 40* 41*  CREATININE 1.64* 1.64* 1.63* 1.47* 1.43*  CALCIUM 10.4* 9.8 10.0 10.0 10.1  MG  --  2.0 1.9 1.9 2.0  PHOS  --  3.6 3.2 2.9 3.1   GFR: Estimated Creatinine Clearance: 39.8 mL/min (A) (by C-G formula based on SCr of 1.43 mg/dL (H)). Liver Function Tests: Recent Labs  Lab 09/19/20 0957 09/20/20 0257 09/21/20 0203 09/22/20 0354  AST 28 32 20 36  ALT 26 24 25  53*  ALKPHOS 41 32* 29* 34*  BILITOT 1.1 1.2 0.4 0.7  PROT 6.9 6.2* 6.0* 6.2*  ALBUMIN 3.2* 3.0* 2.8* 2.9*   No results for input(s): LIPASE, AMYLASE in the last 168 hours. No results for input(s): AMMONIA in the last 168 hours. Coagulation Profile: No results for input(s): INR, PROTIME in the last 168 hours. Cardiac Enzymes: No results for input(s): CKTOTAL, CKMB, CKMBINDEX, TROPONINI in the last 168 hours. BNP (last 3 results) No results for input(s): PROBNP in the last 8760 hours. HbA1C: No results for input(s): HGBA1C in the last 72 hours. CBG: Recent Labs  Lab 09/21/20 1622 09/21/20 2353 09/22/20 0421 09/22/20 0746 09/22/20 1157  GLUCAP 314* 267* 185* 155* 242*   Lipid Profile: No results for input(s): CHOL, HDL, LDLCALC, TRIG, CHOLHDL, LDLDIRECT in the last 72 hours. Thyroid Function Tests: No  results for input(s): TSH, T4TOTAL, FREET4, T3FREE, THYROIDAB in the last 72 hours. Anemia Panel: No results for input(s): VITAMINB12, FOLATE, FERRITIN, TIBC, IRON, RETICCTPCT in the last 72 hours. Sepsis Labs: No results for input(s): PROCALCITON, LATICACIDVEN in the last 168 hours.  Recent Results (from the past 240 hour(s))  Resp Panel by RT-PCR (Flu A&B, Covid) Nasopharyngeal Swab     Status: None   Collection Time: 09/18/20 11:16 PM   Specimen: Nasopharyngeal Swab; Nasopharyngeal(NP) swabs in vial transport medium  Result Value Ref Range Status   SARS Coronavirus 2 by RT PCR NEGATIVE NEGATIVE Final    Comment: (NOTE) SARS-CoV-2 target nucleic acids are NOT DETECTED.  The SARS-CoV-2 RNA is generally detectable in upper respiratory specimens during the acute phase of infection. The lowest concentration of SARS-CoV-2 viral copies this assay can detect is 138 copies/mL. A negative result does not preclude SARS-Cov-2 infection and should not be used as the sole basis for treatment or other patient management decisions. A negative result may occur with  improper specimen collection/handling, submission of specimen other than nasopharyngeal swab, presence of viral mutation(s) within the areas targeted by this assay, and inadequate number of viral copies(<138 copies/mL). A negative result must be combined with clinical observations, patient history, and epidemiological information. The expected result is Negative.  Fact Sheet for Patients:  EntrepreneurPulse.com.au  Fact Sheet for Healthcare Providers:  IncredibleEmployment.be  This test is no t yet approved or cleared by the [place] FDA and  has been authorized for detection and/or diagnosis of SARS-CoV-2 by FDA under an Emergency Use Authorization (EUA). This EUA will remain  in effect (meaning this test can be used) for the duration of the COVID-19 declaration under Section 564(b)(1) of  the Act, 21 U.S.C.section 360bbb-3(b)(1), unless the authorization is terminated  or revoked sooner.       Influenza A by PCR NEGATIVE NEGATIVE Final   Influenza B by PCR NEGATIVE NEGATIVE Final    Comment: (NOTE) The Xpert Xpress SARS-CoV-2/FLU/RSV plus assay is intended as an aid in the diagnosis of influenza from Nasopharyngeal swab specimens and should not be used as a sole basis for treatment. Nasal washings and aspirates are unacceptable for Xpert Xpress SARS-CoV-2/FLU/RSV testing.  Fact Sheet for Patients: EntrepreneurPulse.com.au  Fact Sheet for Healthcare  Providers: IncredibleEmployment.be  This test is not yet approved or cleared by the [place] and has been authorized for detection and/or diagnosis of SARS-CoV-2 by FDA under an Emergency Use Authorization (EUA). This EUA will remain in effect (meaning this test can be used) for the duration of the COVID-19 declaration under Section 564(b)(1) of the Act, 21 U.S.C. section 360bbb-3(b)(1), unless the authorization is terminated or revoked.  Performed at [facility], [place] [address], [place], [place] [address]          Radiology Studies: MR THORACIC SPINE W WO CONTRAST  Result Date: 09/21/2020 CLINICAL DATA:  Thoracic spinal lesion. Rule out malignancy or hemangioma EXAM: MRI THORACIC WITHOUT AND WITH CONTRAST TECHNIQUE: Multiplanar and multiecho pulse sequences of the thoracic spine were obtained without and with intravenous contrast. CONTRAST:  72mL GADAVIST GADOBUTROL 1 MMOL/ML IV SOLN COMPARISON:  CT thoracic spine 09/18/2020 FINDINGS: Alignment:  Normal Vertebrae: Negative for fracture Extensive abnormality throughout the T6 vertebral body which is diffusely low signal on T1 and high signal on T2. Bony trabeculations are seen throughout the vertebral body on CT. There is extension of this process into the left T6 pedicle and also extending into the left T6  transverse process and lamina. There is extension into the spinal canal with enhancing epidural soft tissue surrounding the cord and causing cord compression. Much of the bone lesion also shows enhancement. There is a second smaller lesion in the T3 vertebral body anteriorly on the right measuring 1 cm. This is low signal on T1 and also shows mild enhancement. Cord: Cord compression at T6 due to epidural soft tissue mass. There is mild cord edema at this level. Remainder of the cord signal is normal. Paraspinal and other soft tissues: No paraspinous mass or fluid collection. 3 cm subpleural cyst left lower lobe posteriorly. Mild left lower lobe airspace disease. Disc levels: Negative for thoracic disc protrusion. Mild disc space narrowing and disc desiccation at multiple levels. IMPRESSION: Extensive abnormality of the T6 vertebral body extending into the posterior elements on the left. There is also extensive epidural soft tissue process surrounding the cord and causing cord compression at the T6 level. Mild associated cord edema. Bony trabeculations are present within the vertebral body on CT without aggressive bony destruction. No associated fat content in the lesion. 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 Malignancy including metastatic disease and myeloma also considered. Second 1 cm lesion T3 vertebral body with a similar appearance on MRI. Electronically Signed   By: Franchot Gallo M.D.   On: 09/21/2020 13:59   DG CHEST PORT 1 VIEW  Result Date: 09/21/2020 CLINICAL DATA:  Pacemaker.  CHF. EXAM: PORTABLE CHEST 1 VIEW COMPARISON:  09/19/2020 FINDINGS: Multi lead left chest wall implanted cardiac device. Post CABG changes. Hiatal hernia. Cardiomegaly. Atherosclerotic calcification of the aortic knob. No focal airspace consolidation, pleural effusion, or pneumothorax. Known T6 lesion is not well seen radiographically. Remaining osseous structures are within normal  limits. IMPRESSION: Cardiomegaly. No acute cardiopulmonary disease. Electronically Signed   By: Davina Poke D.O.   On: 09/21/2020 10:36   US THYROID  Result Date: 09/20/2020 CLINICAL DATA:  Hypothyroidism EXAM: THYROID ULTRASOUND TECHNIQUE: Ultrasound examination of the thyroid gland and adjacent soft tissues was performed. COMPARISON:  None. FINDINGS: Parenchymal Echotexture: Normal Isthmus: 0.2 cm Right lobe: 2.7 x 1.2 x 1.3 cm Left lobe: 2.6 x 0.9 x 1 cm _________________________________________________________ Estimated total number of nodules >/= 1 cm: 1 Number of  spongiform nodules >/=  2 cm not described below (TR1): 0 Number of mixed cystic and solid nodules >/= 1.5 cm not described below (TR2): 0 _________________________________________________________ There is a mixed cystic and solid TR 2 thyroid nodule in the right mid to superior thyroid gland that does not meet criteria for follow-up or fine-needle aspiration. There is a small calcification in the left thyroid gland. IMPRESSION: Relatively small thyroid gland with a single right-sided thyroid nodule that does not meet criteria for follow-up or fine-needle aspiration. The above is in keeping with the ACR TI-RADS recommendations - J Am Coll Radiol 2017;14:587-595. Electronically Signed   By: Constance Holster M.D.   On: 09/20/2020 16:31        Scheduled Meds: . apixaban  2.5 mg Oral BID  . dexamethasone (DECADRON) injection  6 mg Intravenous Q6H  . digoxin  62.5 mcg Oral Daily  . finasteride  5 mg Oral QHS  . insulin aspart  0-15 Units Subcutaneous TID AC & HS  . insulin aspart  8 Units Subcutaneous TID WC  . insulin glargine  14 Units Subcutaneous Daily  . isosorbide mononitrate  45 mg Oral QPM  . pneumococcal 23 valent vaccine  0.5 mL Intramuscular Tomorrow-1000  . sodium chloride flush  3 mL Intravenous Q12H  . sotalol  120 mg Oral Daily  . sotalol  80 mg Oral Daily  . tamsulosin  0.4 mg Oral Daily   Continuous  Infusions: . sodium chloride       LOS: 3 days    Time spent: 32 minutes    Barb Merino, MD [facility] Pager [phone]

## 2020-09-23 ENCOUNTER — Ambulatory Visit
Admit: 2020-09-23 | Discharge: 2020-09-23 | Disposition: A | Discharge status: Home / Self Care | Payer: Medicare PPO | Attending: Radiation Oncology | Admitting: Radiation Oncology

## 2020-09-23 DIAGNOSIS — Z8739 Personal history of other diseases of the musculoskeletal system and connective tissue: Secondary | ICD-10-CM

## 2020-09-23 DIAGNOSIS — D1800 Hemangioma unspecified site: Secondary | ICD-10-CM | POA: Insufficient documentation

## 2020-09-23 LAB — GLUCOSE, CAPILLARY
Glucose-Capillary: 181 mg/dL — ABNORMAL HIGH (ref 70–99)
Glucose-Capillary: 213 mg/dL — ABNORMAL HIGH (ref 70–99)
Glucose-Capillary: 220 mg/dL — ABNORMAL HIGH (ref 70–99)
Glucose-Capillary: 321 mg/dL — ABNORMAL HIGH (ref 70–99)

## 2020-09-23 LAB — T3, FREE: T3, Free: 1.5 pg/mL — ABNORMAL LOW (ref 2.0–4.4)

## 2020-09-23 MED ORDER — DEXAMETHASONE 4 MG PO TABS
6.0000 mg | ORAL_TABLET | Freq: Four times a day (QID) | ORAL | Status: DC
  Administered 2020-09-23 – 2020-09-25 (×7): 6 mg via ORAL
  Filled 2020-09-23 (×7): qty 2

## 2020-09-23 NOTE — Progress Notes (Addendum)
PROGRESS NOTE    Timothy Neal  SPQ:[id number] DOB: 02/09/1941 DOA: 09/18/2020 PCP: [name], MD    Brief Narrative:  Timothy Neal was admitted to the hospital with acute on chronic spinal cord compression due to hemangioma.  80 year old male past medical history for coronary artery disease status post CABG, systolic heart failure, paroxysmal atrial fibrillation, chronic kidney disease stage III, hypertension, dyslipidemia and type 2 diabetes mellitus who presented with lower extremity weakness.  Reported worsening ambulatory dysfunction, to the point where he was using a rolling walker.  On his initial physical examination blood pressure 135/58, heart rate 68, respiratory rate 24, temperature 98.7, oxygen saturation 95%, his lungs were clear to auscultation bilaterally, heart S1-S2, present, regular regular, soft abdomen, no lower extremity edema.  Spine CT showed a lesion in T6 most likely hemangioma. Further work-up with MRI showed extensive abnormality at the level of T6, epidural soft tissue causing cord compression, mild cord edema consistent with possible hemangioma.  Patient was placed on systemic corticosteroids and neurosurgery was consulted. Recommendations for normal surgical therapy, including radiation, PT and OT. He was transferred from [facility] to [place] on 2/23 for simulation and start radiation on 02/24.  Recommendation for a course of 5 weeks of radiotherapy to the T6 lesion.  Assessment & Plan:   Principal Problem:   Spinal cord compression (HCC) Active Problems:   Cardiomyopathy ([place])   Chronic kidney disease, stage 3 unspecified (HCC)   Chronic systolic heart failure (HCC)   Coronary artery disease   Diabetes mellitus, type II (HCC)   Dyslipidemia   History of gout   Obstructive sleep apnea (adult) (pediatric)   Paroxysmal atrial fibrillation (HCC)   S/P CABG (coronary artery bypass graft)   S/P ICD (internal cardiac defibrillator) procedure   CKD  (chronic kidney disease), stage IV ([place])   Controlled type 2 diabetes mellitus with hyperglycemia (HCC)   HLD (hyperlipidemia)   Hypothyroidism   1. 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 Continue to have lower extremity weakness. Patient had stimulation today and plan to start radiation in am.  Continue with systemic steroids.   Consult PT and OT for evaluation, patient is from assisted living facility.   2. Paroxysmal atrial fibrillation Continue sotalol, and anticoagulation with apixaban.   3. Chronic systolic heart failure. Sp AICD. Clinically stable with no signs of exacerbation.  Continue with digoxin and isosorbide.   4. T2DM steroid induced hyperglycemia with dyslipidemia. Continue glucose cover and monitoring with insulin sliding scale. Basal insulin 14 units and 8 units pre-meal.  Continue with atorvastatin  5. Hypothyroid. Continue with levothyroxine   6. Chronic urinary retention. Continue catheterization qid.   7. CKD stage IV. Renal function with serum cr at 1,43 with K at 4,5 and bicarbonate 23.     Status is: Inpatient  Remains inpatient appropriate because:IV treatments appropriate due to intensity of illness or inability to take PO   Dispo: The patient is from: Home              Anticipated d/c is to: Home              Anticipated d/c date is: 1 day              Patient currently is not medically stable to d/c.   Difficult to place patient No   DVT prophylaxis: apixaban    Code Status:    full Family Communication:  Patient communicating with his sister over the phone.  Consultants:  Radiation oncology  Neurosurgery   Subjective: Patient with no back pain, no nausea or vomiting, continue to have lower extremity weakness,   Objective: Vitals:   09/22/20 1718 09/22/20 2000 09/23/20 0500 09/23/20 1432  BP: (!) 165/85 130/71 133/69 (!) 148/81  Pulse: 70 70 70 72  Resp: 16 16 15 16   Temp: 97.7 F (36.5 C) 97.7 F (36.5  C) 98.2 F (36.8 C) 97.8 F (36.6 C)  TempSrc: Oral Oral Oral Oral  SpO2: 97% 97% 98% 98%  Weight:    71.5 kg  Height:    5\' 7"  (1.702 m)    Intake/Output Summary (Last 24 hours) at 09/23/2020 1535 Last data filed at 09/23/2020 1400 Gross per 24 hour  Intake -  Output 1650 ml  Net -1650 ml   Filed Weights   09/19/20 0003 09/20/20 0500 09/23/20 1432  Weight: 68 kg 68 kg 71.5 kg    Examination:   General: Not in pain or dyspnea, deconditioned  Neurology: Awake and alert, non focal  E ENT: no pallor, no icterus, oral mucosa moist Cardiovascular: No JVD. S1-S2 present, rhythmic, no gallops, rubs, or murmurs. No lower extremity edema. Pulmonary: positive breath sounds bilaterally, adequate air movement, no wheezing, rhonchi or rales. Gastrointestinal. Abdomen soft and non tender Skin. No rashes Musculoskeletal: no joint deformities     Data Reviewed: I have personally reviewed following labs and imaging studies  CBC: Recent Labs  Lab 09/18/20 1225 09/19/20 0957 09/20/20 0257 09/21/20 0203 09/22/20 0354  WBC 8.4 5.1 11.4* 10.3 9.0  NEUTROABS  --  4.6 10.1* 9.2* 7.9*  HGB 12.2* 12.4* 11.3* 10.8* 11.7*  HCT 39.2 37.1* 34.7* 33.4* 36.6*  MCV 90.3 88.1 88.3 88.1 88.0  PLT 172 167 173 169 903   Basic Metabolic Panel: Recent Labs  Lab 09/18/20 1225 09/19/20 0957 09/20/20 0257 09/21/20 0203 09/22/20 0354  NA 137 135 132* 133* 133*  K 4.7 4.5 4.8 4.2 4.5  CL 104 104 101 102 102  CO2 26 21* 18* 21* 23  GLUCOSE 139* 398* 178* 162* 226*  BUN 24* 29* 32* 40* 41*  CREATININE 1.64* 1.64* 1.63* 1.47* 1.43*  CALCIUM 10.4* 9.8 10.0 10.0 10.1  MG  --  2.0 1.9 1.9 2.0  PHOS  --  3.6 3.2 2.9 3.1   GFR: Estimated Creatinine Clearance: 39.8 mL/min (A) (by C-G formula based on SCr of 1.43 mg/dL (H)). Liver Function Tests: Recent Labs  Lab 09/19/20 0957 09/20/20 0257 09/21/20 0203 09/22/20 0354  AST 28 32 20 36  ALT 26 24 25  53*  ALKPHOS 41 32* 29* 34*  BILITOT 1.1  1.2 0.4 0.7  PROT 6.9 6.2* 6.0* 6.2*  ALBUMIN 3.2* 3.0* 2.8* 2.9*   No results for input(s): LIPASE, AMYLASE in the last 168 hours. No results for input(s): AMMONIA in the last 168 hours. Coagulation Profile: No results for input(s): INR, PROTIME in the last 168 hours. Cardiac Enzymes: No results for input(s): CKTOTAL, CKMB, CKMBINDEX, TROPONINI in the last 168 hours. BNP (last 3 results) No results for input(s): PROBNP in the last 8760 hours. HbA1C: No results for input(s): HGBA1C in the last 72 hours. CBG: Recent Labs  Lab 09/22/20 0746 09/22/20 1157 09/22/20 1633 09/22/20 2219 09/23/20 0629  GLUCAP 155* 242* 176* 179* 181*   Lipid Profile: No results for input(s): CHOL, HDL, LDLCALC, TRIG, CHOLHDL, LDLDIRECT in the last 72 hours. Thyroid Function Tests: Recent Labs    09/22/20 0354  T3FREE 1.5*   Anemia  Panel: No results for input(s): VITAMINB12, FOLATE, FERRITIN, TIBC, IRON, RETICCTPCT in the last 72 hours.    Radiology Studies: I have reviewed all of the imaging during this hospital visit personally     Scheduled Meds: . apixaban  2.5 mg Oral BID  . dexamethasone (DECADRON) injection  6 mg Intravenous Q6H  . digoxin  62.5 mcg Oral Daily  . finasteride  5 mg Oral QHS  . insulin aspart  0-15 Units Subcutaneous TID AC & HS  . insulin aspart  8 Units Subcutaneous TID WC  . insulin glargine  14 Units Subcutaneous Daily  . isosorbide mononitrate  45 mg Oral QPM  . pneumococcal 23 valent vaccine  0.5 mL Intramuscular Tomorrow-1000  . sodium chloride flush  3 mL Intravenous Q12H  . sotalol  120 mg Oral Daily  . sotalol  80 mg Oral Daily  . tamsulosin  0.4 mg Oral Daily   Continuous Infusions: . sodium chloride       LOS: 4 days        Mauricio Gerome Apley, MD

## 2020-09-23 NOTE — Progress Notes (Signed)
Gave report to [employer] at [facility], where patient will be transferred to.

## 2020-09-23 NOTE — Addendum Note (Signed)
Encounter addended by: Hayden Pedro, PA-C on: 09/23/2020 7:22 AM  Actions taken: Level of Service modified

## 2020-09-24 ENCOUNTER — Ambulatory Visit
Admit: 2020-09-24 | Discharge: 2020-09-24 | Disposition: A | Discharge status: Home / Self Care | Payer: Medicare PPO | Attending: Radiation Oncology | Admitting: Radiation Oncology

## 2020-09-24 DIAGNOSIS — Z951 Presence of aortocoronary bypass graft: Secondary | ICD-10-CM

## 2020-09-24 LAB — GLUCOSE, CAPILLARY
Glucose-Capillary: 199 mg/dL — ABNORMAL HIGH (ref 70–99)
Glucose-Capillary: 236 mg/dL — ABNORMAL HIGH (ref 70–99)
Glucose-Capillary: 220 mg/dL — ABNORMAL HIGH (ref 70–99)
Glucose-Capillary: 227 mg/dL — ABNORMAL HIGH (ref 70–99)

## 2020-09-24 NOTE — Evaluation (Signed)
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   Clinical Impression   Timothy Neal is a 80 year old man with above medical history who presents today with strong upper body strength and decreased lower body strength. Patient min assist for supine to sit, min guard to pivot to recliner and stand with RW. In regards to ADLs patient demonstrates ability to don and doff socks and pull up pseudo pants. Patient reports he had AE at home but that he prefers not use it - he has been receiving OT and PT at ALF. Patient talks through his entire ADL routine and knows compensatory strategies. He is modified independent for ADLs at wheelchair except for bathign - which he has assistance for transfers and safety. Patient does not need OT services while in acute care. Would recommend continue [facility] OT at discharge to continue to learn compensatory strategies in home environment.     Follow Up Recommendations  Home health OT    Equipment Recommendations  None recommended by OT    Recommendations for Other Services       Precautions / Restrictions Precautions Precautions: Fall Restrictions Weight Bearing Restrictions: No      Mobility Bed Mobility Overal bed mobility: Needs Assistance Bed Mobility: Supine to Sit     Supine to sit: Min assist          Transfers Overall transfer level: Needs assistance Equipment used: Rolling walker (2 wheeled) Transfers: Set designer Transfers;Sit to/from Stand     Squat pivot transfers: Min guard     General transfer comment: Min guard to transfer from bed to recliner placed on his right. Able to stand from recliner with use of RW.    Balance Overall balance assessment: Mild deficits observed, not formally tested                                          ADL either performed or assessed with clinical judgement   ADL Overall ADL's : Modified independent                                             Vision Patient Visual Report: No change from baseline       Perception     Praxis      Pertinent Vitals/Pain Pain Assessment: No/denies pain     Hand Dominance Right   Extremity/Trunk Assessment Upper Extremity Assessment Upper Extremity Assessment: Overall WFL for tasks assessed   Lower Extremity Assessment Lower Extremity Assessment: Defer to PT evaluation   Cervical / Trunk Assessment Cervical / Trunk Assessment: Normal   Communication Communication Communication: No difficulties   Cognition Arousal/Alertness: Awake/alert Behavior During Therapy: WFL for tasks assessed/performed Overall Cognitive Status: Within Functional Limits for tasks assessed                                     General Comments       Exercises     Shoulder Instructions      Home Living Family/patient expects to  be discharged to:: Assisted living Living Arrangements: Alone                           Home Equipment: Walker - 4 wheels;Wheelchair - manual;Cane - single point;Walker - 2 wheels   Additional Comments: Reports independence with ADLs. Has been using wheelchair the last two months. Gradual weakness of LEs since November.      Prior Functioning/Environment Level of Independence: Independent with assistive device(s)        Comments: Has assistance with bathing only. Has been modified independent from wc level except for bathing. Has been getting OT and PT at ALF.        OT Problem List:        OT Treatment/Interventions:      OT Goals(Current goals can be found in the care plan section) Acute Rehab OT Goals OT Goal Formulation: All assessment and education complete, DC therapy  OT Frequency:     Barriers to D/C:            Co-evaluation               AM-PAC OT "6 Clicks" Daily Activity     Outcome Measure Help from another person eating meals?: None Help from another person taking care of personal grooming?: None Help from another person toileting, which includes using toliet, bedpan, or urinal?: None Help from another person bathing (including washing, rinsing, drying)?: None Help from another person to put on and taking off regular upper body clothing?: None Help from another person to put on and taking off regular lower body clothing?: None 6 Click Score: 24   End of Session Equipment Utilized During Treatment: Gait belt;Rolling walker Nurse Communication: Mobility status  Activity Tolerance: Patient tolerated treatment well Patient left: in chair;with call bell/phone within reach;with family/visitor present;with nursing/sitter in room  OT Visit Diagnosis: Muscle weakness (generalized) (M62.81)                Time: 0626-9485 OT Time Calculation (min): 24 min Charges:  OT General Charges $OT Visit: 1 Visit OT Evaluation $OT Eval Moderate Complexity: 1 Mod  Cedra Villalon, OTR/L [place]  Office [phone] Pager: [phone]   Lenward Chancellor 09/24/2020, 1:46 PM

## 2020-09-24 NOTE — Evaluation (Addendum)
Physical Therapy Evaluation Patient Details Name: Timothy Neal MRN: [id number] DOB: July 30, 1941 Today's Date: 09/24/2020   History of Present Illness  Patient is a 80 y.o male presenting from [facility] to [facility] for progressive LE weakness and fall. 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 Pt transferred to [facility] to begin radiation therapy as he is not a surgical candidate. 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    Clinical Impression  Pt is a 80y.o. male with above HPI. Pt reports that he was independent without AD for mobility ~6 months ago but recently has been using wheelchair for long distances and rollator for shorter distance. Pt currently requires MOD assist for power up and to steady with sit<>stand using RW. Pt required MOD-MAX assist for steadying during gait and ambulated ~33ft with 2 seated rest breaks due to muscle fatigue; cues needed for walker management. Pt demonstrated scissoring gait 2/2 LE muscle weakness and significant knee buckling when fatigued requiring assist to prevent LOB. EOS pt completed squat pivot chair>bed moving towards Rt/stronger side, Mod assist needed to complete. PT reviewed therapeutic interventions for promotion of DVT prevention and LE strengthening, pt demonstrated understanding. Pt lives at assisted living facility where he receives PT ~3x/week. Recommend home with continued PT upon discharge. Pt will benefit from skilled PT to increase independence and safety with mobility. Acute therapy to follow up during stay.      Follow Up Recommendations Follow surgeon's recommendation for DC plan and follow-up therapies;Home health PT    Equipment Recommendations  Rolling walker with 5" wheels    Recommendations for Other Services       Precautions / Restrictions Precautions Precautions: Fall Restrictions Weight Bearing Restrictions: No      Mobility  Bed  Mobility Overal bed mobility: Needs Assistance Bed Mobility: Sit to Supine       Sit to supine: Mod assist   General bed mobility comments: pt OOB in recliner; MIN assist for B LEs onto bed with sit to supine.    Transfers Overall transfer level: Needs assistance Equipment used: Rolling walker (2 wheeled) Transfers: Sit to/from Stand    Sit to/from Stand: Mod assist Squat pivot transfers: Mod assist     General transfer comment: x4 from recliner; MOD assist for steadying and for power up to stand with use of B UEs to push up from recliner. Pt able to perform pre gait marching with MOD assist and use of B UEs on RW. Mod assist for squat pivot chair>EOB with pt transferring to strong side on Rt.   Ambulation/Gait Ambulation/Gait assistance: Mod assist;Max assist Gait Distance (Feet): 28 Feet (10,15,3) Assistive device: Rolling walker (2 wheeled) Gait Pattern/deviations: Step-through pattern;Decreased step length - right;Decreased step length - left;Decreased dorsiflexion - right;Decreased dorsiflexion - left;Scissoring;Drifts right/left;Narrow base of support;Shuffle Gait velocity: decr   General Gait Details: pt with intermittent scissoring gait 2/2 LE muscle weakness creating narrow BOS requiring MOD-MAX assist from therapist to maintain stability during gait. Pt demonstrated significant knee buckling Lt>Rt with no LOB whn fatigued. PT provided verbal and tactile cues for RW management and to maintain safe proximity to RW for pt safety.  Stairs            Wheelchair Mobility    Modified Rankin (Stroke Patients Only)       Balance Overall balance assessment: Needs assistance Sitting-balance support: Feet supported Sitting balance-Leahy Scale: Fair     Standing balance  support: Bilateral upper extremity supported;During functional activity Standing balance-Leahy Scale: Poor Standing balance comment: use of RW and assist from therapist to maintain standing balance.                              Pertinent Vitals/Pain Pain Assessment: No/denies pain    Home Living Family/patient expects to be discharged to:: Assisted living [facility]) Living Arrangements: Alone   Type of Home: Apartment Home Access: Level entry     Home Layout: One level Home Equipment: Walker - 4 wheels;Wheelchair - manual;Cane - single point;Walker - 2 wheels      Prior Function Level of Independence: Needs assistance;Independent with assistive device(s)   Gait / Transfers Assistance Needed: Pt has had progressive weakness and now requires wheelchair for long distances and rollator for short distances. Pt states that he was walkign ~30mos ago with no device.  ADL's / Homemaking Assistance Needed: modified independent per pt and OT eval notes  Comments: Pt works ~3x/wk with PT at [employer]. He also goes to the gym there and uses the nustep. He does exercies that work endurance and balance for LE's.     Hand Dominance   Dominant Hand: Right    Extremity/Trunk Assessment   Upper Extremity Assessment Upper Extremity Assessment: Defer to OT evaluation (impressive UE strength pt able to pull up while supine to reposition in bed)    Lower Extremity Assessment Lower Extremity Assessment: Generalized weakness (Lt LE weaker than Rt)    Cervical / Trunk Assessment Cervical / Trunk Assessment: Normal  Communication   Communication: No difficulties  Cognition Arousal/Alertness: Awake/alert Behavior During Therapy: WFL for tasks assessed/performed Overall Cognitive Status: Within Functional Limits for tasks assessed                                        General Comments      Exercises General Exercises - Lower Extremity Ankle Circles/Pumps: AROM;Both;20 reps;Supine Quad Sets: AROM;Both;5 reps;Supine   Assessment/Plan    PT Assessment Patient needs continued PT services  PT Problem List Decreased strength;Decreased range of  motion;Decreased activity tolerance;Decreased balance;Decreased mobility;Decreased coordination;Decreased knowledge of use of DME       PT Treatment Interventions DME instruction;Gait training;Functional mobility training;Therapeutic activities;Therapeutic exercise;Balance training;Patient/family education    PT Goals (Current goals can be found in the Care Plan section)  Acute Rehab PT Goals Patient Stated Goal: be able to walk more/longer PT Goal Formulation: With patient Time For Goal Achievement: 10/08/20 Potential to Achieve Goals: Good    Frequency Min 3X/week   Barriers to discharge        Co-evaluation               AM-PAC PT "6 Clicks" Mobility  Outcome Measure Help needed turning from your back to your side while in a flat bed without using bedrails?: A Little Help needed moving from lying on your back to sitting on the side of a flat bed without using bedrails?: A Little Help needed moving to and from a bed to a chair (including a wheelchair)?: A Lot Help needed standing up from a chair using your arms (e.g., wheelchair or bedside chair)?: A Lot Help needed to walk in hospital room?: A Lot Help needed climbing 3-5 steps with a railing? : Total 6 Click Score: 13    End of Session Equipment  Utilized During Treatment: Gait belt Activity Tolerance: Patient tolerated treatment well Patient left: in bed;with bed alarm set;with call bell/phone within reach Nurse Communication: Mobility status PT Visit Diagnosis: Unsteadiness on feet (R26.81);Muscle weakness (generalized) (M62.81);Other abnormalities of gait and mobility (R26.89);History of falling (Z91.81);Difficulty in walking, not elsewhere classified (R26.2)    Time: 4239-5320 PT Time Calculation (min) (ACUTE ONLY): 32 min        09/24/20 1400  PT Time Calculation  PT Start Time (ACUTE ONLY) 1453  PT Stop Time (ACUTE ONLY) 1525  PT Time Calculation (min) (ACUTE ONLY) 32 min  PT General Charges  $$ ACUTE  PT VISIT 1 Visit  PT Evaluation  $PT Eval Moderate Complexity 1 Mod  PT Treatments  $Gait Training 8-22 mins    Lauren Youngblood, SPT  Acute rehab    Lauren Youngblood 09/24/2020, 5:52 PM    I agree with the following treatment note after reviewing documentation. This session was performed under the supervision of a licensed clinician.   Verner Mould, DPT Acute Rehabilitation Services Office [phone] Pager [phone]

## 2020-09-24 NOTE — Progress Notes (Signed)
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  80 year old male past medical history for coronary artery disease status post CABG, systolic heart failure, paroxysmal atrial fibrillation, chronic kidney disease stage III, hypertension, dyslipidemia and type 2 diabetes mellitus who presented with lower extremity weakness.  Reported worsening ambulatory dysfunction, to the point where he was using a rolling walker.  On his initial physical examination blood pressure 135/58, heart rate 68, respiratory rate 24, temperature 98.7, oxygen saturation 95%, his lungs were clear to auscultation bilaterally, heart S1-S2, present, regular regular, soft abdomen, no lower extremity edema.  Spine CT showed a lesion in T6 most likely hemangioma. Further work-up with MRI showed extensive abnormality at the level of T6, epidural soft tissue causing cord compression, mild cord edema consistent with possible hemangioma.  Patient was placed on systemic corticosteroids and neurosurgery was consulted. Recommendations for normal surgical therapy, including radiation, PT and OT. He was transferred from [facility] to [place] on 2/23 for simulation and start radiation on 02/24.  Recommendation for a course of 5 weeks of radiotherapy to the T6 lesion.  He started radiation therapy 02/24 with good toleration.    Assessment & Plan:   Principal Problem:   Spinal cord compression (HCC) Active Problems:   Cardiomyopathy ([place])   Chronic kidney disease, stage 3 unspecified (HCC)   Chronic systolic heart failure (HCC)   Coronary artery disease   Diabetes mellitus, type II (HCC)   Dyslipidemia   History of gout   Obstructive sleep apnea (adult) (pediatric)   Paroxysmal atrial fibrillation (HCC)   S/P CABG (coronary artery bypass  graft)   S/P ICD (internal cardiac defibrillator) procedure   CKD (chronic kidney disease), stage IV ([place])   Controlled type 2 diabetes mellitus with hyperglycemia (HCC)   HLD (hyperlipidemia)   Hypothyroidism    1. 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 Improvement in lower extremity strength but not yet back to baseline. Sp stimulation 02/23 and 1st radiation 02/24.   On oral systemic steroids.   Follow with Pt and OT recommendations, plan to return to assisted living facility with home health services.  Will need transportation Monday to Friday for radiation therapy.    2. Paroxysmal atrial fibrillation On sotalol, and anticoagulation with apixaban.   3. Chronic systolic heart failure. Sp AICD.  No clinical signs of exacerbation.  On digoxin and isosorbide.   4. T2DM steroid induced hyperglycemia with dyslipidemia. Uncontrolled hyperglycemia, capillary 220, 321, 199 and 236. Continue with insulin coverage with basal insulin 14 units and 8 units pre-meal.  On atorvastatin  5. Hypothyroid. On levothyroxine   6. Chronic urinary retention. He will continue bladder catheterization in and on qid.   7. CKD stage IV. His renal function has been stable, follow as outpatient. Patient has been tolerating po well.    Status is: Inpatient  Remains inpatient appropriate because:Inpatient level of care appropriate due to severity of illness   Dispo: The patient is from: ALF              Anticipated d/c is to: ALF              Patient currently is not medically stable to d/c.   Difficult to place patient No   DVT prophylaxis: apixaban   Code Status:   full  Family Communication:  I spoke with patient's sister at the bedside, we talked in detail about patient's condition, plan of care and prognosis and all questions were addressed.      Consultants:   Neurosurgery   Radiation oncology     Subjective: Patient is feeling better but not yet at his  baseline, continue to be very weak and deconditioned, no nausea or vomiting.   Objective: Vitals:   09/23/20 1853 09/23/20 2124 09/24/20 0241 09/24/20 0508  BP: (!) 158/76 (!) 147/85 139/80 130/79  Pulse: 73 81 81 68  Resp: 16 16 16 14   Temp: 97.6 F (36.4 C) 98.2 F (36.8 C) 97.8 F (36.6 C) 97.6 F (36.4 C)  TempSrc: Oral Oral Oral Oral  SpO2: 96% 98% 97% 100%  Weight:    71.4 kg  Height:        Intake/Output Summary (Last 24 hours) at 09/24/2020 1540 Last data filed at 09/24/2020 0841 Gross per 24 hour  Intake 480 ml  Output 1100 ml  Net -620 ml   Filed Weights   09/20/20 0500 09/23/20 1432 09/24/20 0508  Weight: 68 kg 71.5 kg 71.4 kg    Examination:   General: Not in pain or dyspnea,  Neurology: Awake and alert, non focal  E ENT: mild pallor, no icterus, oral mucosa moist Cardiovascular: No JVD. S1-S2 present, rhythmic, no gallops, rubs, or murmurs. No lower extremity edema. Pulmonary: positive breath sounds bilaterally,  Gastrointestinal. Abdomen soft and non tender Skin. No rashes Musculoskeletal: no joint deformities     Data Reviewed: I have personally reviewed following labs and imaging studies  CBC: Recent Labs  Lab 09/18/20 1225 09/19/20 0957 09/20/20 0257 09/21/20 0203 09/22/20 0354  WBC 8.4 5.1 11.4* 10.3 9.0  NEUTROABS  --  4.6 10.1* 9.2* 7.9*  HGB 12.2* 12.4* 11.3* 10.8* 11.7*  HCT 39.2 37.1* 34.7* 33.4* 36.6*  MCV 90.3 88.1 88.3 88.1 88.0  PLT 172 167 173 169 209   Basic Metabolic Panel: Recent Labs  Lab 09/18/20 1225 09/19/20 0957 09/20/20 0257 09/21/20 0203 09/22/20 0354  NA 137 135 132* 133* 133*  K 4.7 4.5 4.8 4.2 4.5  CL 104 104 101 102 102  CO2 26 21* 18* 21* 23  GLUCOSE 139* 398* 178* 162* 226*  BUN 24* 29* 32* 40* 41*  CREATININE 1.64* 1.64* 1.63* 1.47* 1.43*  CALCIUM 10.4* 9.8 10.0 10.0 10.1  MG  --  2.0 1.9 1.9 2.0  PHOS  --  3.6 3.2 2.9 3.1   GFR: Estimated Creatinine Clearance: 39.8 mL/min (A) (by C-G formula  based on SCr of 1.43 mg/dL (H)). Liver Function Tests: Recent Labs  Lab 09/19/20 0957 09/20/20 0257 09/21/20 0203 09/22/20 0354  AST 28 32 20 36  ALT 26 24 25  53*  ALKPHOS 41 32* 29* 34*  BILITOT 1.1 1.2 0.4 0.7  PROT 6.9 6.2* 6.0* 6.2*  ALBUMIN 3.2* 3.0* 2.8* 2.9*   No results for input(s): LIPASE, AMYLASE in the last 168 hours. No results for input(s): AMMONIA in the last 168 hours. Coagulation Profile: No results for input(s): INR, PROTIME in the last 168 hours. Cardiac Enzymes: No results for input(s): CKTOTAL, CKMB, CKMBINDEX, TROPONINI in the last 168 hours. BNP (last 3 results) No results for input(s): PROBNP in the last 8760 hours. HbA1C: No results for input(s): HGBA1C in the last 72 hours. CBG: Recent Labs  Lab 09/23/20 1328 09/23/20 1700 09/23/20 2126 09/24/20 0734 09/24/20 1154  GLUCAP 213* 220* 321* 199* 236*  Lipid Profile: No results for input(s): CHOL, HDL, LDLCALC, TRIG, CHOLHDL, LDLDIRECT in the last 72 hours. Thyroid Function Tests: Recent Labs    09/22/20 0354  T3FREE 1.5*   Anemia Panel: No results for input(s): VITAMINB12, FOLATE, FERRITIN, TIBC, IRON, RETICCTPCT in the last 72 hours.    Radiology Studies: I have reviewed all of the imaging during this hospital visit personally     Scheduled Meds: . apixaban  2.5 mg Oral BID  . dexamethasone  6 mg Oral Q6H  . digoxin  62.5 mcg Oral Daily  . finasteride  5 mg Oral QHS  . insulin aspart  0-15 Units Subcutaneous TID AC & HS  . insulin aspart  8 Units Subcutaneous TID WC  . insulin glargine  14 Units Subcutaneous Daily  . isosorbide mononitrate  45 mg Oral QPM  . pneumococcal 23 valent vaccine  0.5 mL Intramuscular Tomorrow-1000  . sodium chloride flush  3 mL Intravenous Q12H  . sotalol  120 mg Oral Daily  . sotalol  80 mg Oral Daily  . tamsulosin  0.4 mg Oral Daily   Continuous Infusions: . sodium chloride       LOS: 5 days        Aneri Slagel Gerome Apley, MD

## 2020-09-24 NOTE — TOC Initial Note (Signed)
Transition of Care [facility]) - Initial/Assessment Note    Patient Details  Name: Timothy Neal MRN: [id number] Date of Birth: [birth year]  Transition of Care [facility]) CM/SW Contact:    Lynnell Catalan, RN Phone Number: 09/24/2020, 2:27 PM  Clinical Narrative:                 Pt from [facility] and has been receiving PT/OT services with the facility company [facility]. Pt wants to go back to his apartment and continue PT/OT with [facility]. He will need HHPT/OT orders at [facility]. Pt states that he will arrange his own transportation home with [employer].  Expected Discharge Plan: [place] Barriers to Discharge: Continued Medical Work up   Patient Goals and CMS Choice Patient states their goals for this hospitalization and ongoing recovery are:: To get back to his apartment      Expected Discharge Plan and Services Expected Discharge Plan: [place]   Discharge Planning Services: CM Consult   Living arrangements for the past 2 months: [place]                                      Prior Living Arrangements/Services Living arrangements for the past 2 months: [place] Lives with:: Facility Resident Patient language and need for interpreter reviewed:: Yes        Need for Family Participation in Patient Care: Yes (Comment) Care giver support system in place?: Yes (comment)   Criminal Activity/Legal Involvement Pertinent to Current Situation/Hospitalization: No - Comment as needed  Activities of Daily Living Home Assistive Devices/Equipment: TEFL teacher (specify quad or straight),Walker (specify type),Wheelchair ADL Screening (condition at time of admission) Patient's cognitive ability adequate to safely complete daily activities?: Yes Is the patient deaf or have difficulty hearing?: No Does the patient have difficulty seeing, even when wearing glasses/contacts?: No Does the  patient have difficulty concentrating, remembering, or making decisions?: No Patient able to express need for assistance with ADLs?: Yes Does the patient have difficulty dressing or bathing?: No Independently performs ADLs?: Yes (appropriate for developmental age) Does the patient have difficulty walking or climbing stairs?: Yes Weakness of Legs: Both Weakness of Arms/Hands: None  Permission Sought/Granted                  Emotional Assessment Appearance:: Appears stated age Attitude/Demeanor/Rapport: Engaged Affect (typically observed): Calm Orientation: : Oriented to Self,Oriented to Place,Oriented to  Time,Oriented to Situation Alcohol / Substance Use: Not Applicable    Admission diagnosis:  Spinal cord compression (HCC) [G95.20] Type 2 DM with CKD stage 3 and hypertension ([place]) [E11.22, I12.9, N18.30] Patient Active Problem List   Diagnosis Date Noted  . Hemangioma 09/23/2020  . Spinal cord compression ([place]) 09/19/2020  . CKD (chronic kidney disease), stage IV ([place]) 09/19/2020  . Controlled type 2 diabetes mellitus with hyperglycemia ([place]) 09/19/2020  . HLD (hyperlipidemia) 09/19/2020  . Hypothyroidism 09/19/2020  . Chronic kidney disease, stage 3 unspecified ([place]) 04/20/2016  . Coronary artery disease 04/20/2016  . Dyslipidemia 04/20/2016  . History of gout 04/20/2016  . Obstructive sleep apnea (adult) (pediatric) 04/20/2016  . Paroxysmal atrial fibrillation ([place]) 04/20/2016  . Chronic systolic heart failure ([place]) 09/23/2014  . 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 Cardiomyopathy ([place]) 08/26/2014  . Diabetes mellitus, type II ([place]) 08/26/2014  . 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

## 2020-09-25 ENCOUNTER — Ambulatory Visit
Admit: 2020-09-25 | Discharge: 2020-09-25 | Disposition: A | Discharge status: Home / Self Care | Payer: Medicare PPO | Attending: Radiation Oncology | Admitting: Radiation Oncology

## 2020-09-25 DIAGNOSIS — N1831 Chronic kidney disease, stage 3a: Secondary | ICD-10-CM

## 2020-09-25 DIAGNOSIS — Z794 Long term (current) use of insulin: Secondary | ICD-10-CM

## 2020-09-25 DIAGNOSIS — I5022 Chronic systolic (congestive) heart failure: Secondary | ICD-10-CM

## 2020-09-25 LAB — GLUCOSE, CAPILLARY
Glucose-Capillary: 198 mg/dL — ABNORMAL HIGH (ref 70–99)
Glucose-Capillary: 237 mg/dL — ABNORMAL HIGH (ref 70–99)

## 2020-09-25 MED ORDER — DIGOXIN 0.0625 MG HALF TABLET
65.2000 ug | ORAL_TABLET | Freq: Every day | ORAL | Status: DC
  Administered 2020-09-25: 62.5 ug via ORAL
  Filled 2020-09-25: qty 1

## 2020-09-25 MED ORDER — DEXAMETHASONE 4 MG PO TABS
4.0000 mg | ORAL_TABLET | Freq: Four times a day (QID) | ORAL | Status: DC
  Administered 2020-09-25: 4 mg via ORAL
  Filled 2020-09-25: qty 1

## 2020-09-25 MED ORDER — DEXAMETHASONE 4 MG PO TABS: 4.0000 mg | ORAL_TABLET | Freq: Four times a day (QID) | ORAL | 0 refills | Status: DC

## 2020-09-25 NOTE — Care Management Important Message (Signed)
Important Message  Patient Details IM Letter given to the Patient. Name: Timothy Neal MRN: [id number] Date of Birth: July 25, 1941   Medicare Important Message Given:  Yes     Kerin Salen 09/25/2020, 10:17 AM

## 2020-09-25 NOTE — Plan of Care (Signed)
  Problem: Health Behavior/Discharge Planning: Goal: Ability to manage health-related needs will improve Outcome: Not Progressing   

## 2020-09-25 NOTE — TOC Transition Note (Signed)
Transition of Care [facility]) - CM/SW Discharge Note   Patient Details  Name: Timothy Neal MRN: [id number] Date of Birth: June 17, 1941  [facility] CM/SW Contact:  Lynnell Catalan, RN Phone Number: 09/25/2020, 10:09 AM   Clinical Narrative:    HHPT/OT orders faxed to [facility] Pt to dc back to [facility] living today.    Final next level of care: [place] Barriers to Discharge: Continued Medical Work up   Patient Goals and CMS Choice Patient states their goals for this hospitalization and ongoing recovery are:: To get back to his apartment     Discharge Plan and Services   Discharge Planning Services: CM Consult               Readmission Risk Interventions No flowsheet data found.

## 2020-09-25 NOTE — Discharge Summary (Signed)
Physician Discharge Summary  Timothy Neal JQB:[id number] DOB: 1941/05/17 DOA: 09/18/2020  PCP: [name], MD  Admit date: 09/18/2020 Discharge date: 09/25/2020  Admitted From: ALF Disposition:  ALF  Recommendations for Outpatient Follow-up and new medication changes:  1. Follow up with Dr. Daphene Jaeger in 7 days.  2. Patient will continue dexamethasone 4 mg qid, further taper per radiation oncology.  3. Follow up renal function and electrolytes in 7 days.    Home Health: yes   Equipment/Devices: na    Discharge Condition: stable  CODE STATUS: full  Diet recommendation: heart healthy and diabetic prudent.   Brief/Interim Summary: Mr. Kubisiak was admitted to the hospital with acute on chronic spinal cord compression due to hemangioma.  80 year old male past medical history for coronary artery disease status post CABG, systolic heart failure, paroxysmal atrial fibrillation,chronic kidney disease stage III, hypertension, dyslipidemia and type 2 diabetes mellitus who presented with lower extremity weakness. Reported worsening ambulatory dysfunction, to the point where he was using a rolling walker. On his initial physical examination blood pressure 135/58, heart rate 68, respiratory rate24, temperature 98.7, oxygen saturation 95%,his lungs were clear to auscultation bilaterally, heart S1-S2, present, regular regular, soft abdomen, no lower extremity edema.  Sodium 137, potassium 4.7, chloride 104, bicarb 26, glucose 139, BUN 24, creatinine 1.64, white count 8.4, hemoglobin 12.2, hematocrit 39.2, platelets 172. SARS COVID-19 negative. Urine analysis specific gravity 1.014, 0-5 white cells.  0-5 red cells.  100 protein.  CT of the chest abdomen pelvis no acute changes.  Spine CT showed a lesion in T6 most likely hemangioma. Further work-up with MRI showed extensive abnormality at the level of T6, epidural soft tissue causing cord compression, mild cord edema consistent with possible  hemangioma.  Patient was placed on systemic corticosteroids and neurosurgery was consulted. Recommendations for non surgical therapy, including radiation, PT and OT. He was transferred from [facility] to [place] on 2/23 for simulation and start radiation on 02/24. Recommendation for a course of 5 weeks of radiotherapy to the T6 lesion.  He started radiation therapy 02/24 with good toleration.   1.  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  Patient has responded well to radiation and systemic corticosteroids.  He will continue radiation therapy for the next 5 weeks. She will be discharged with dexamethasone 4 mg 4 times daily, further taper as an outpatient.  Radiation oncology. Resume home home health services with home therapy.  He will return to assisted living facility.  2.  Paroxysmal atrial fibrillation.  Continue rate controlled with sotalol, anticoagulation with apixaban.  3.  Chronic systolic heart failure, status post AICD.  No clinical signs of exacerbation, continue digoxin, enalapril and isosorbide. Cautious use of digoxin in the setting of renal failure. Diuretic therapy with furosemide 20 mg daily.   4.  Type 2 diabetes mellitus, steroid-induced hyperglycemia.  Dyslipidemia.  His glucose remained uncontrolled due to systemic steroids.  He received insulin therapy, sliding scale, basal and Premeal. Dose of dexamethasone has been reduced to 4 mg every 6 hours, further taper as an outpatient. At discharge resume glipizide and tresiba injections.   Continue with atorvastatin.  5.  Hypothyroidism.  Continue levothyroxine.  6.  Chronic urinary retention.  Continue bladder catheterizations 4 times daily. Continue with finesteride and silodosin.  7.  Chronic kidney disease stage 3a. (GFR 51)  His kidney function remained stable.  At discharge sodium 133, potassium 4.5, chloride 102, bicarb 23, glucose 126, BUN 41, creatinine 1.43. Avoid nephrotoxic  agents  or hypotension.  8. Iron deficiency anemia. Continue with oral iron supplementation.    Discharge Diagnoses:  Principal Problem:   Spinal cord compression (HCC) Active Problems:   Chronic kidney disease, stage 3 unspecified (HCC)   Chronic systolic heart failure (HCC)   Coronary artery disease   Diabetes mellitus, type II (HCC)   Dyslipidemia   Paroxysmal atrial fibrillation (HCC)   S/P CABG (coronary artery bypass graft)   S/P ICD (internal cardiac defibrillator) procedure   Hypothyroidism    Discharge Instructions   Allergies as of 09/25/2020   No Known Allergies     Medication List    TAKE these medications   atorvastatin 40 MG tablet Commonly known as: LIPITOR Take 40 mg by mouth every Monday, Wednesday, and Friday. At bedtime   carvedilol 12.5 MG tablet Commonly known as: COREG Take 12.5 mg by mouth 2 (two) times daily.   Cholecalciferol 25 MCG (1000 UT) capsule Take 1,000 Units by mouth daily.   Coenzyme Q10 200 MG capsule Take 200 mg by mouth daily.   dexamethasone 4 MG tablet Commonly known as: DECADRON Take 1 tablet (4 mg total) by mouth every 6 (six) hours for 14 days.   digoxin 0.125 MG tablet Commonly known as: LANOXIN Take 0.125 mg by mouth daily.   Eliquis 2.5 MG Tabs tablet Generic drug: apixaban Take 2.5 mg by mouth 2 (two) times daily.   enalapril 2.5 MG tablet Commonly known as: VASOTEC Take 2.5 mg by mouth 2 (two) times daily.   ferrous sulfate 325 (65 FE) MG tablet Take 325 mg by mouth every Monday, Wednesday, and Friday.   finasteride 5 MG tablet Commonly known as: PROSCAR Take 5 mg by mouth at bedtime.   Fish Oil 1200 MG Caps Take 1,200 mg by mouth daily.   furosemide 40 MG tablet Commonly known as: LASIX Take 20 mg by mouth daily.   glipiZIDE 5 MG tablet Commonly known as: GLUCOTROL Take 5 mg by mouth 2 (two) times daily.   isosorbide mononitrate 30 MG 24 hr tablet Commonly known as: IMDUR Take 30 mg by mouth at  bedtime.   multivitamin with minerals Tabs tablet Take 1 tablet by mouth daily.   Ozempic (0.25 or 0.5 MG/DOSE) 2 MG/1.5ML Sopn Generic drug: Semaglutide(0.25 or 0.5MG /DOS) Inject 0.5 mg into the skin every Sunday.   potassium chloride SA 20 MEQ tablet Commonly known as: KLOR-CON Take 20 mEq by mouth every Monday, Wednesday, and Friday.   silodosin 8 MG Caps capsule Commonly known as: RAPAFLO Take 8 mg by mouth daily with breakfast.   sotalol 80 MG tablet Commonly known as: BETAPACE Take 80-120 mg by mouth See admin instructions. Take 1 tablet (80mg ) by mouth in the morning and take 1 and 1/2 tablet (120mg ) by mouth in the evening.   Tyler Aas FlexTouch 100 UNIT/ML FlexTouch Pen Generic drug: insulin degludec Inject 18-20 Units into the skin daily after breakfast.   Turmeric Curcumin 500 MG Caps Take 500 mg by mouth daily.       No Known Allergies  Consultations:  Neurosurgery   Radiation Oncology      Subjective: Patient is feeling better, stable lower extremity edema, he tolerated well physical and occupational therapy.   Discharge Exam: Vitals:   09/24/20 2200 09/25/20 0646  BP: 137/86 (!) 141/82  Pulse: 72 75  Resp: 17 18  Temp: 97.7 F (36.5 C) (!) 97.5 F (36.4 C)  SpO2: 98% 97%   Vitals:   09/24/20 6945  09/24/20 1704 09/24/20 2200 09/25/20 0646  BP: 130/79 (!) 157/95 137/86 (!) 141/82  Pulse: 68 100 72 75  Resp: 14 16 17 18   Temp: 97.6 F (36.4 C) (!) 97.4 F (36.3 C) 97.7 F (36.5 C) (!) 97.5 F (36.4 C)  TempSrc: Oral Oral Oral Oral  SpO2: 100% 99% 98% 97%  Weight: 71.4 kg   70.4 kg  Height:        General: Not in pain or dyspnea.  Neurology: Awake and alert, non focal  E ENT: no pallor, no icterus, oral mucosa moist Cardiovascular: No JVD. S1-S2 present, rhythmic, no gallops, rubs, or murmurs. No lower extremity edema. Pulmonary: positive breath sounds bilaterally, adequate air movement, no wheezing, rhonchi or  rales. Gastrointestinal. Abdomen soft and non tender Skin. No rashes Musculoskeletal: no joint deformities   The results of significant diagnostics from this hospitalization (including imaging, microbiology, ancillary and laboratory) are listed below for reference.     Microbiology: Recent Results (from the past 240 hour(s))  Resp Panel by RT-PCR (Flu A&B, Covid) Nasopharyngeal Swab     Status: None   Collection Time: 09/18/20 11:16 PM   Specimen: Nasopharyngeal Swab; Nasopharyngeal(NP) swabs in vial transport medium  Result Value Ref Range Status   SARS Coronavirus 2 by RT PCR NEGATIVE NEGATIVE Final    Comment: (NOTE) SARS-CoV-2 target nucleic acids are NOT DETECTED.  The SARS-CoV-2 RNA is generally detectable in upper respiratory specimens during the acute phase of infection. The lowest concentration of SARS-CoV-2 viral copies this assay can detect is 138 copies/mL. A negative result does not preclude SARS-Cov-2 infection and should not be used as the sole basis for treatment or other patient management decisions. A negative result may occur with  improper specimen collection/handling, submission of specimen other than nasopharyngeal swab, presence of viral mutation(s) within the areas targeted by this assay, and inadequate number of viral copies(<138 copies/mL). A negative result must be combined with clinical observations, patient history, and epidemiological information. The expected result is Negative.  Fact Sheet for Patients:  EntrepreneurPulse.com.au  Fact Sheet for Healthcare Providers:  IncredibleEmployment.be  This test is no t yet approved or cleared by the [place] FDA and  has been authorized for detection and/or diagnosis of SARS-CoV-2 by FDA under an Emergency Use Authorization (EUA). This EUA will remain  in effect (meaning this test can be used) for the duration of the COVID-19 declaration under Section 564(b)(1) of  the Act, 21 U.S.C.section 360bbb-3(b)(1), unless the authorization is terminated  or revoked sooner.       Influenza A by PCR NEGATIVE NEGATIVE Final   Influenza B by PCR NEGATIVE NEGATIVE Final    Comment: (NOTE) The Xpert Xpress SARS-CoV-2/FLU/RSV plus assay is intended as an aid in the diagnosis of influenza from Nasopharyngeal swab specimens and should not be used as a sole basis for treatment. Nasal washings and aspirates are unacceptable for Xpert Xpress SARS-CoV-2/FLU/RSV testing.  Fact Sheet for Patients: EntrepreneurPulse.com.au  Fact Sheet for Healthcare Providers: IncredibleEmployment.be  This test is not yet approved or cleared by the [place] FDA and has been authorized for detection and/or diagnosis of SARS-CoV-2 by FDA under an Emergency Use Authorization (EUA). This EUA will remain in effect (meaning this test can be used) for the duration of the COVID-19 declaration under Section 564(b)(1) of the Act, 21 U.S.C. section 360bbb-3(b)(1), unless the authorization is terminated or revoked.  Performed at [facility], [place] [address], [place], [place] [address]  Labs: BNP (last 3 results) No results for input(s): BNP in the last 8760 hours. Basic Metabolic Panel: Recent Labs  Lab 09/18/20 1225 09/19/20 0957 09/20/20 0257 09/21/20 0203 09/22/20 0354  NA 137 135 132* 133* 133*  K 4.7 4.5 4.8 4.2 4.5  CL 104 104 101 102 102  CO2 26 21* 18* 21* 23  GLUCOSE 139* 398* 178* 162* 226*  BUN 24* 29* 32* 40* 41*  CREATININE 1.64* 1.64* 1.63* 1.47* 1.43*  CALCIUM 10.4* 9.8 10.0 10.0 10.1  MG  --  2.0 1.9 1.9 2.0  PHOS  --  3.6 3.2 2.9 3.1   Liver Function Tests: Recent Labs  Lab 09/19/20 0957 09/20/20 0257 09/21/20 0203 09/22/20 0354  AST 28 32 20 36  ALT 26 24 25  53*  ALKPHOS 41 32* 29* 34*  BILITOT 1.1 1.2 0.4 0.7  PROT 6.9 6.2* 6.0* 6.2*  ALBUMIN 3.2* 3.0* 2.8* 2.9*   No results for input(s):  LIPASE, AMYLASE in the last 168 hours. No results for input(s): AMMONIA in the last 168 hours. CBC: Recent Labs  Lab 09/18/20 1225 09/19/20 0957 09/20/20 0257 09/21/20 0203 09/22/20 0354  WBC 8.4 5.1 11.4* 10.3 9.0  NEUTROABS  --  4.6 10.1* 9.2* 7.9*  HGB 12.2* 12.4* 11.3* 10.8* 11.7*  HCT 39.2 37.1* 34.7* 33.4* 36.6*  MCV 90.3 88.1 88.3 88.1 88.0  PLT 172 167 173 169 180   Cardiac Enzymes: No results for input(s): CKTOTAL, CKMB, CKMBINDEX, TROPONINI in the last 168 hours. BNP: Invalid input(s): POCBNP CBG: Recent Labs  Lab 09/24/20 0734 09/24/20 1154 09/24/20 1700 09/24/20 2157 09/25/20 0746  GLUCAP 199* 236* 220* 227* 198*   D-Dimer No results for input(s): DDIMER in the last 72 hours. Hgb A1c No results for input(s): HGBA1C in the last 72 hours. Lipid Profile No results for input(s): CHOL, HDL, LDLCALC, TRIG, CHOLHDL, LDLDIRECT in the last 72 hours. Thyroid function studies No results for input(s): TSH, T4TOTAL, T3FREE, THYROIDAB in the last 72 hours.  Invalid input(s): FREET3 Anemia work up No results for input(s): VITAMINB12, FOLATE, FERRITIN, TIBC, IRON, RETICCTPCT in the last 72 hours. Urinalysis    Component Value Date/Time   COLORURINE YELLOW 09/18/2020 1959   APPEARANCEUR CLEAR 09/18/2020 1959   LABSPEC 1.014 09/18/2020 1959   PHURINE 5.0 09/18/2020 1959   GLUCOSEU NEGATIVE 09/18/2020 1959   HGBUR NEGATIVE 09/18/2020 1959   BILIRUBINUR NEGATIVE 09/18/2020 1959   KETONESUR NEGATIVE 09/18/2020 1959   PROTEINUR 100 (A) 09/18/2020 1959   NITRITE NEGATIVE 09/18/2020 1959   LEUKOCYTESUR NEGATIVE 09/18/2020 1959   Sepsis Labs Invalid input(s): PROCALCITONIN,  WBC,  LACTICIDVEN Microbiology Recent Results (from the past 240 hour(s))  Resp Panel by RT-PCR (Flu A&B, Covid) Nasopharyngeal Swab     Status: None   Collection Time: 09/18/20 11:16 PM   Specimen: Nasopharyngeal Swab; Nasopharyngeal(NP) swabs in vial transport medium  Result Value Ref Range  Status   SARS Coronavirus 2 by RT PCR NEGATIVE NEGATIVE Final    Comment: (NOTE) SARS-CoV-2 target nucleic acids are NOT DETECTED.  The SARS-CoV-2 RNA is generally detectable in upper respiratory specimens during the acute phase of infection. The lowest concentration of SARS-CoV-2 viral copies this assay can detect is 138 copies/mL. A negative result does not preclude SARS-Cov-2 infection and should not be used as the sole basis for treatment or other patient management decisions. A negative result may occur with  improper specimen collection/handling, submission of specimen other than nasopharyngeal swab, presence of viral mutation(s) within the  areas targeted by this assay, and inadequate number of viral copies(<138 copies/mL). A negative result must be combined with clinical observations, patient history, and epidemiological information. The expected result is Negative.  Fact Sheet for Patients:  EntrepreneurPulse.com.au  Fact Sheet for Healthcare Providers:  IncredibleEmployment.be  This test is no t yet approved or cleared by the [place] FDA and  has been authorized for detection and/or diagnosis of SARS-CoV-2 by FDA under an Emergency Use Authorization (EUA). This EUA will remain  in effect (meaning this test can be used) for the duration of the COVID-19 declaration under Section 564(b)(1) of the Act, 21 U.S.C.section 360bbb-3(b)(1), unless the authorization is terminated  or revoked sooner.       Influenza A by PCR NEGATIVE NEGATIVE Final   Influenza B by PCR NEGATIVE NEGATIVE Final    Comment: (NOTE) The Xpert Xpress SARS-CoV-2/FLU/RSV plus assay is intended as an aid in the diagnosis of influenza from Nasopharyngeal swab specimens and should not be used as a sole basis for treatment. Nasal washings and aspirates are unacceptable for Xpert Xpress SARS-CoV-2/FLU/RSV testing.  Fact Sheet for  Patients: EntrepreneurPulse.com.au  Fact Sheet for Healthcare Providers: IncredibleEmployment.be  This test is not yet approved or cleared by the [place] FDA and has been authorized for detection and/or diagnosis of SARS-CoV-2 by FDA under an Emergency Use Authorization (EUA). This EUA will remain in effect (meaning this test can be used) for the duration of the COVID-19 declaration under Section 564(b)(1) of the Act, 21 U.S.C. section 360bbb-3(b)(1), unless the authorization is terminated or revoked.  Performed at [facility], [place] [address], [place], [place] [address]      Time coordinating discharge: 45 minutes  SIGNED:   Tawni Millers, MD  [facility] 09/25/2020, 9:13 AM

## 2020-09-25 NOTE — Discharge Instructions (Signed)
Chronic Kidney Disease, Adult Chronic kidney disease is when lasting damage happens to the kidneys slowly over a long time. The kidneys help to:  Make pee (urine).  Make hormones.  Keep the right amount of fluids and chemicals in the body. Most often, this disease does not go away. You must take steps to help keep the kidney damage from getting worse. If steps are not taken, the kidneys might stop working forever. What are the causes?  Diabetes.  High blood pressure.  Diseases that affect the heart and blood vessels.  Other kidney diseases.  Diseases of the body's disease-fighting system.  A problem with the flow of pee.  Infections of the organs that make pee, store it, and take it out of the body.  Swelling or irritation of your blood vessels. What increases the risk?  Getting older.  Having someone in your family who has kidney disease or kidney failure.  Having a disease caused by genes.  Taking medicines often that harm the kidneys.  Being near or having contact with harmful substances.  Being very overweight.  Using tobacco now or in the past. What are the signs or symptoms?  Feeling very tired.  Having a swollen face, legs, ankles, or feet.  Feeling like you may vomit or vomiting.  Not feeling hungry.  Being confused or not able to focus.  Twitches and cramps in the leg muscles or other muscles.  Dry, itchy skin.  A taste of metal in your mouth.  Making less pee, or making more pee.  Shortness of breath.  Trouble sleeping. You may also become anemic or get weak bones. Anemic means there is not enough red blood cells or hemoglobin in your blood. You may get symptoms slowly. You may not notice them until the kidney damage gets very bad. How is this treated? Often, there is no cure for this disease. Treatment can help with symptoms and help keep the disease from getting worse. You may need to:  Avoid alcohol.  Avoid foods that are high in  salt, potassium, phosphorous, and protein.  Take medicines for symptoms and to help control other conditions.  Have dialysis. This treatment gets harmful waste out of your body.  Treat other problems that cause your kidney disease or make it worse. Follow these instructions at home: Medicines  Take over-the-counter and prescription medicines only as told by your doctor.  Do not take any new medicines, vitamins, or supplements unless your doctor says it is okay. Lifestyle  Do not smoke or use any products that contain nicotine or tobacco. If you need help quitting, ask your doctor.  If you drink alcohol: ? Limit how much you use to:  0-1 drink a day for women who are not pregnant.  0-2 drinks a day for men. ? Know how much alcohol is in your drink. In the [place] one drink equals one 12 oz bottle of beer (355 mL), one 5 oz glass of wine (148 mL), or one 1 oz glass of hard liquor (44 mL).  Stay at a healthy weight. If you need help losing weight, ask your doctor.   General instructions  Follow instructions from your doctor about what you cannot eat or drink.  Track your blood pressure at home. Tell your doctor about any changes.  If you have diabetes, track your blood sugar.  Exercise at least 30 minutes a day, 5 days a week.  Keep your shots (vaccinations) up to date.  Keep all follow-up visits.     Where to find more information  American Association of Kidney Patients: BombTimer.gl  National Kidney Foundation: www.kidney.[place]: https://mathis.com/  Life Options: www.lifeoptions.org  Kidney School: www.kidneyschool.org Contact a doctor if:  Your symptoms get worse.  You get new symptoms. Get help right away if:  You get symptoms of end-stage kidney disease. These include: ? Headaches. ? Losing feeling in your hands or feet. ? Easy bruising. ? Having hiccups often. ? Chest pain. ? Shortness of breath. ? Lack of menstrual periods, in  women.  You have a fever.  You make less pee than normal.  You have pain or you bleed when you pee or poop. These symptoms may be an emergency. Get help right away. Call your local emergency services (911 in the [place]  Do not wait to see if the symptoms will go away.  Do not drive yourself to the hospital. Summary  Chronic kidney disease is when lasting damage happens to the kidneys slowly over a long time.  Causes of this disease include diabetes and high blood pressure.  Often, there is no cure for this disease. Treatment can help symptoms and help keep the disease from getting worse.  Treatment may involve lifestyle changes, medicines, and dialysis. This information is not intended to replace advice given to you by your health care provider. Make sure you discuss any questions you have with your health care provider. Document Revised: 10/23/2019 Document Reviewed: 10/23/2019 Elsevier Patient Education  2021 [place].   Diabetic Nephropathy  Diabetic nephropathy is kidney disease that is caused by diabetes (diabetes mellitus). Kidneys are organs that filter and clean blood and get rid of body waste products and extra fluid. Diabetes can cause gradual kidney damage over many years. Diabetic nephropathy that continues to get worse can lead to kidney failure. What are the causes? This condition is caused by kidney damage from diabetes that is not well controlled with treatment. Having high blood sugar (glucose) for a long time because of diabetes can damage blood vessels in the kidneys and cause them to thicken and become scarred. Those changes prevent the kidneys from functioning normally. What increases the risk? This condition is more likely to develop in people with diabetes who:  Have had diabetes for many years.  Have high blood pressure.  Have high blood glucose levels over a long period of time.  Have a family history of kidney disease.  Have a history of  tobacco use.  Have certain genes that are passed from parent to child (inherited). What are the signs or symptoms? This condition may not cause symptoms at first. If you do have symptoms, they may include:  Swelling of your hands, feet, or ankles.  Weakness.  Poor appetite.  Nausea.  Confusion.  Tiredness (fatigue).  Trouble sleeping.  Dry, itchy skin. If nephropathy leads to kidney failure, symptoms may include:  Vomiting.  Shortness of breath.  Jerky movements that you cannot control (seizure).  Coma. How is this diagnosed? It is important to diagnose this condition before symptoms develop. You may be screened for diabetic nephropathy at a routine health care visit. Screening tests may include:  Urine tests. These may be done every year.  Urine collection over a 24-hour period to measure kidney function.  Blood tests to measure blood glucose levels and kidney function.  Regular blood pressure monitoring. If your health care provider suspects diabetic nephropathy, he or she may:  Review your medical history and symptoms.  Do a physical exam.  Do an ultrasound of your kidneys.  Perform a procedure to take a sample of kidney tissue for testing (biopsy). How is this treated? The goal of treatment is to prevent or slow down any damage to your kidneys by managing your diabetes. To do this, it is important to control:  Your blood pressure. ? Your target blood pressure may vary depending on your medical conditions, your age, and other factors. ? To help control blood pressure, you may be prescribed medicines to lower your blood pressure (ACE inhibitors) or to help your body get rid of excess fluid (diuretics).  Your A1c (hemoglobin A1c) level. Generally, the goal of treatment is to maintain an A1c level of less than 7%.  Your blood glucose level.  Your blood lipids. If you have high cholesterol, you may need to take lipid-lowering drugs, such as statins. Other  treatments may include:  Medicines, including insulin injections.  Lifestyle changes, such as losing weight, quitting smoking, or making changes to your diet. If your disease progresses to end-stage kidney failure, treatment may include:  Dialysis. This is a procedure to filter your blood with a machine.  Kidney transplant. Follow these instructions at home: Eating and drinking  Eat healthy foods, and eat healthy snacks between meals. Follow instructions from your health care provider about eating and drinking restrictions.  Limit your sodium (salt), protein, or fluid intake as directed.  If you drink alcohol: ? Limit how much you use to:  0-1 drink a day for nonpregnant women.  0-2 drinks a day for men. ? Be aware of how much alcohol is in your drink. In the [place] one drink equals one 12 oz bottle of beer (355 mL), one 5 oz glass of wine (148 mL), or one 1 oz glass of hard liquor (44 mL). Lifestyle  Maintain a healthy weight. Work with your health care provider to lose weight, if needed.  Do not use any products that contain nicotine or tobacco, such as cigarettes, e-cigarettes, and chewing tobacco. If you need help quitting, ask your health care provider.  Be physically active every day. Ask your health care provider what type of exercise is best for you.  Work with your health care provider to manage your blood pressure. General instructions  Follow your diabetes management plan as directed. ? Check your blood glucose levels as directed by your health care provider. ? Keep your blood glucose in your target range as directed by your health care provider. ? Have your A1c level checked two or more times a year, or as often as told by your health care provider.  Measure your blood pressure regularly at home, as told by your health care provider.  Take over-the-counter and prescription medicines only as told by your health care provider. These include insulin and  supplements.  Keep all follow-up visits and routine visits as told by your health care provider. This is important. Make sure you get screening tests as directed.      Where to find more information American Diabetes Association: www.diabetes.org Contact a health care provider if:  You have trouble keeping your blood glucose in your goal range.  Your blood glucose level is higher than 240 mg/dL (13.3 mmol/L) for 2 days in a row.  You have swelling in your hands, ankles, or feet.  You feel weak, tired, or dizzy.  You have sudden muscle tightening (spasms).  You have nausea or vomiting.  You feel tired all the time. Get help right away if:  You are very sleepy.  You faint.  You have: ? A seizure. ? Severe, painful muscle spasms. ? Shortness of breath. ? Chest pain. Summary  Diabetic nephropathy is kidney disease that is caused by diabetes (diabetes mellitus).  Keep your blood sugar (glucose) in your target range as directed by your health care provider.  Work with your health care provider to manage your blood pressure.  Keep all follow-up visits and routine visits as told by your health care provider. This is important. Make sure you get screening tests as directed. This information is not intended to replace advice given to you by your health care provider. Make sure you discuss any questions you have with your health care provider. Document Revised: 12/31/2018 Document Reviewed: 12/31/2018 Elsevier Patient Education  [place].

## 2020-09-28 ENCOUNTER — Other Ambulatory Visit: Payer: Self-pay

## 2020-09-28 ENCOUNTER — Inpatient Hospital Stay: Payer: Medicare PPO | Attending: Radiation Oncology

## 2020-09-28 ENCOUNTER — Ambulatory Visit
Admission: RE | Admit: 2020-09-28 | Discharge: 2020-09-28 | Disposition: A | Discharge status: Home / Self Care | Payer: Medicare PPO | Source: Ambulatory Visit | Attending: Radiation Oncology | Admitting: Radiation Oncology

## 2020-09-28 DIAGNOSIS — Z923 Personal history of irradiation: Secondary | ICD-10-CM | POA: Diagnosis not present

## 2020-09-28 DIAGNOSIS — D1809 Hemangioma of other sites: Secondary | ICD-10-CM | POA: Diagnosis not present

## 2020-09-29 ENCOUNTER — Ambulatory Visit: Payer: Medicare PPO

## 2020-09-30 ENCOUNTER — Ambulatory Visit
Admission: RE | Admit: 2020-09-30 | Discharge: 2020-09-30 | Disposition: A | Discharge status: Home / Self Care | Payer: Medicare PPO | Source: Ambulatory Visit | Attending: Radiation Oncology | Admitting: Radiation Oncology

## 2020-09-30 ENCOUNTER — Encounter: Payer: Self-pay | Admitting: Radiation Oncology

## 2020-09-30 ENCOUNTER — Telehealth: Payer: Self-pay | Admitting: *Deleted

## 2020-09-30 ENCOUNTER — Other Ambulatory Visit: Payer: Self-pay

## 2020-09-30 DIAGNOSIS — Z923 Personal history of irradiation: Secondary | ICD-10-CM | POA: Insufficient documentation

## 2020-09-30 DIAGNOSIS — D1809 Hemangioma of other sites: Secondary | ICD-10-CM | POA: Insufficient documentation

## 2020-09-30 NOTE — Telephone Encounter (Signed)
Spoke with the patient to let him know that the physician wants him to start taking his steroid three times a day starting tomorrow 10/01/2020.  I let him know that I will have a copy of his taper instructions waiting for him at his treatment machine.  He verbalized understanding.  Gloriajean Dell. Leonie Green, BSN

## 2020-09-30 NOTE — Progress Notes (Signed)
  Radiation Oncology         [phone]) [phone] ________________________________  Name: Timothy Neal  [id number]:[id number]  Date of Service: 09/30/20  DOB: 1941-05-07   Steroid Taper Instructions   You currently have a prescription for Dexamethasone 4 mg Tablets.   Beginning 10/01/20 Take a 4 mg tablet three times a day  Beginning 10/08/20 Take a 4 mg tablet twice a day  Beginning 10/15/20 Take 1/2 of a tablet (which is 2 mg) twice a day  Beginning 10/22/20 Take 1/2 of a tablet (which is 2 mg) once a day  Beginning 10/29/20: Take 1/2 of a tablet (which is 2 mg) every other day and stop on 11/05/20   Please call our office if you have any weakness, difficulty walking, numbness or tingling, or uncontrolled movements.

## 2020-10-01 ENCOUNTER — Ambulatory Visit
Admission: RE | Admit: 2020-10-01 | Discharge: 2020-10-01 | Disposition: A | Discharge status: Home / Self Care | Payer: Medicare PPO | Source: Ambulatory Visit | Attending: Radiation Oncology | Admitting: Radiation Oncology

## 2020-10-01 ENCOUNTER — Other Ambulatory Visit: Payer: Self-pay

## 2020-10-01 DIAGNOSIS — D1809 Hemangioma of other sites: Secondary | ICD-10-CM | POA: Diagnosis not present

## 2020-10-02 ENCOUNTER — Ambulatory Visit: Payer: Medicare PPO

## 2020-10-05 ENCOUNTER — Ambulatory Visit
Admission: RE | Admit: 2020-10-05 | Discharge: 2020-10-05 | Disposition: A | Discharge status: Home / Self Care | Payer: Medicare PPO | Source: Ambulatory Visit | Attending: Radiation Oncology | Admitting: Radiation Oncology

## 2020-10-05 ENCOUNTER — Other Ambulatory Visit: Payer: Self-pay

## 2020-10-05 DIAGNOSIS — D1809 Hemangioma of other sites: Secondary | ICD-10-CM | POA: Diagnosis not present

## 2020-10-06 ENCOUNTER — Ambulatory Visit
Admission: RE | Admit: 2020-10-06 | Discharge: 2020-10-06 | Disposition: A | Discharge status: Home / Self Care | Payer: Medicare PPO | Source: Ambulatory Visit | Attending: Radiation Oncology | Admitting: Radiation Oncology

## 2020-10-06 DIAGNOSIS — D1809 Hemangioma of other sites: Secondary | ICD-10-CM | POA: Diagnosis not present

## 2020-10-07 ENCOUNTER — Ambulatory Visit
Admission: RE | Admit: 2020-10-07 | Discharge: 2020-10-07 | Disposition: A | Discharge status: Home / Self Care | Payer: Medicare PPO | Source: Ambulatory Visit | Attending: Radiation Oncology | Admitting: Radiation Oncology

## 2020-10-07 DIAGNOSIS — D1809 Hemangioma of other sites: Secondary | ICD-10-CM | POA: Diagnosis not present

## 2020-10-08 ENCOUNTER — Ambulatory Visit
Admission: RE | Admit: 2020-10-08 | Discharge: 2020-10-08 | Disposition: A | Discharge status: Home / Self Care | Payer: Medicare PPO | Source: Ambulatory Visit | Attending: Radiation Oncology | Admitting: Radiation Oncology

## 2020-10-08 DIAGNOSIS — D1809 Hemangioma of other sites: Secondary | ICD-10-CM | POA: Diagnosis not present

## 2020-10-09 ENCOUNTER — Ambulatory Visit
Admission: RE | Admit: 2020-10-09 | Discharge: 2020-10-09 | Disposition: A | Discharge status: Home / Self Care | Payer: Medicare PPO | Source: Ambulatory Visit | Attending: Radiation Oncology | Admitting: Radiation Oncology

## 2020-10-09 ENCOUNTER — Other Ambulatory Visit: Payer: Self-pay | Admitting: Radiation Oncology

## 2020-10-09 ENCOUNTER — Other Ambulatory Visit: Payer: Self-pay

## 2020-10-09 DIAGNOSIS — D1809 Hemangioma of other sites: Secondary | ICD-10-CM | POA: Diagnosis not present

## 2020-10-09 MED ORDER — DEXAMETHASONE 4 MG PO TABS: 4.0000 mg | ORAL_TABLET | Freq: Every day | ORAL | 0 refills | Status: DC

## 2020-10-09 MED ORDER — SUCRALFATE 1 G PO TABS: 1.0000 g | ORAL_TABLET | Freq: Four times a day (QID) | ORAL | 2 refills | Status: AC

## 2020-10-12 ENCOUNTER — Ambulatory Visit
Admission: RE | Admit: 2020-10-12 | Discharge: 2020-10-12 | Disposition: A | Discharge status: Home / Self Care | Payer: Medicare PPO | Source: Ambulatory Visit | Attending: Radiation Oncology | Admitting: Radiation Oncology

## 2020-10-12 DIAGNOSIS — D1809 Hemangioma of other sites: Secondary | ICD-10-CM | POA: Diagnosis not present

## 2020-10-13 ENCOUNTER — Ambulatory Visit
Admission: RE | Admit: 2020-10-13 | Discharge: 2020-10-13 | Disposition: A | Discharge status: Home / Self Care | Payer: Medicare PPO | Source: Ambulatory Visit | Attending: Radiation Oncology | Admitting: Radiation Oncology

## 2020-10-13 DIAGNOSIS — D1809 Hemangioma of other sites: Secondary | ICD-10-CM | POA: Diagnosis not present

## 2020-10-14 ENCOUNTER — Ambulatory Visit
Admission: RE | Admit: 2020-10-14 | Discharge: 2020-10-14 | Disposition: A | Discharge status: Home / Self Care | Payer: Medicare PPO | Source: Ambulatory Visit | Attending: Radiation Oncology | Admitting: Radiation Oncology

## 2020-10-14 ENCOUNTER — Other Ambulatory Visit: Payer: Self-pay

## 2020-10-14 ENCOUNTER — Other Ambulatory Visit: Payer: Self-pay | Admitting: Radiation Oncology

## 2020-10-14 DIAGNOSIS — D1809 Hemangioma of other sites: Secondary | ICD-10-CM | POA: Diagnosis not present

## 2020-10-14 MED ORDER — DEXAMETHASONE 4 MG PO TABS: 4.0000 mg | ORAL_TABLET | Freq: Every day | ORAL | 0 refills | Status: AC

## 2020-10-14 NOTE — Progress Notes (Signed)
Pt complains of progressive lower extremity edema and progressive weakness. Numbness of the trunk continues. He just adjusted his steroids from 4 mg BID of Dexamethasone to 2 mg BID starting today. Overnight is the acuity of his symptoms progressed. We discussed taking 3 tablets po when he gets home (12 mg total) today. Then start 4 mg TID until 10/30/20 as per taper.

## 2020-10-14 NOTE — Progress Notes (Signed)
  Radiation Oncology         [phone]) [phone] ________________________________  Name: Timothy Neal  GTX:[id number]  Date of Service: 10/14/20  DOB: [birth year]   Steroid Taper Instructions   You currently have a prescription for Dexamethasone 4 mg Tablets.   Today 10/14/20 take 3 full tablets when you get home.  Beginning 10/15/20 Take a 4 mg tablet three times a day  Beginning 10/30/20 Take a 4 mg tablet two times a day  Beginning 11/06/20: Take 1/2 of a tablet (which is 2 mg) twice a day  Beginning 11/13/20: Take 1/2 of a tablet (which is 2 mg) once a day  Beginning 11/20/20: Take 1/2 of a tablet (which is 2 mg) every other day and stop on 11/25/20.   Please call our office if you have any headaches, visual changes, uncontrolled movements, nausea or vomiting.

## 2020-10-15 ENCOUNTER — Ambulatory Visit
Admission: RE | Admit: 2020-10-15 | Discharge: 2020-10-15 | Disposition: A | Discharge status: Home / Self Care | Payer: Medicare PPO | Source: Ambulatory Visit | Attending: Radiation Oncology | Admitting: Radiation Oncology

## 2020-10-15 DIAGNOSIS — D1809 Hemangioma of other sites: Secondary | ICD-10-CM | POA: Diagnosis not present

## 2020-10-16 ENCOUNTER — Ambulatory Visit
Admission: RE | Admit: 2020-10-16 | Discharge: 2020-10-16 | Disposition: A | Discharge status: Home / Self Care | Payer: Medicare PPO | Source: Ambulatory Visit | Attending: Radiation Oncology | Admitting: Radiation Oncology

## 2020-10-16 ENCOUNTER — Other Ambulatory Visit: Payer: Self-pay

## 2020-10-16 DIAGNOSIS — D1809 Hemangioma of other sites: Secondary | ICD-10-CM | POA: Diagnosis present

## 2020-10-16 DIAGNOSIS — Z923 Personal history of irradiation: Secondary | ICD-10-CM | POA: Diagnosis not present

## 2020-10-19 ENCOUNTER — Encounter (HOSPITAL_COMMUNITY): Payer: Self-pay

## 2020-10-19 ENCOUNTER — Other Ambulatory Visit: Payer: Self-pay

## 2020-10-19 ENCOUNTER — Inpatient Hospital Stay (HOSPITAL_COMMUNITY)
Admission: EM | Admit: 2020-10-19 | Discharge: 2020-10-30 | DRG: 378 | Disposition: E | Payer: Medicare PPO | Attending: Internal Medicine | Admitting: Internal Medicine

## 2020-10-19 ENCOUNTER — Emergency Department (HOSPITAL_COMMUNITY): Payer: Medicare PPO

## 2020-10-19 ENCOUNTER — Ambulatory Visit: Payer: Medicare PPO

## 2020-10-19 DIAGNOSIS — B9562 Methicillin resistant Staphylococcus aureus infection as the cause of diseases classified elsewhere: Secondary | ICD-10-CM | POA: Diagnosis present

## 2020-10-19 DIAGNOSIS — E785 Hyperlipidemia, unspecified: Secondary | ICD-10-CM | POA: Diagnosis present

## 2020-10-19 DIAGNOSIS — Z951 Presence of aortocoronary bypass graft: Secondary | ICD-10-CM

## 2020-10-19 DIAGNOSIS — L03114 Cellulitis of left upper limb: Secondary | ICD-10-CM | POA: Diagnosis present

## 2020-10-19 DIAGNOSIS — Z833 Family history of diabetes mellitus: Secondary | ICD-10-CM

## 2020-10-19 DIAGNOSIS — D509 Iron deficiency anemia, unspecified: Secondary | ICD-10-CM | POA: Diagnosis present

## 2020-10-19 DIAGNOSIS — R296 Repeated falls: Secondary | ICD-10-CM | POA: Diagnosis present

## 2020-10-19 DIAGNOSIS — I2510 Atherosclerotic heart disease of native coronary artery without angina pectoris: Secondary | ICD-10-CM | POA: Diagnosis present

## 2020-10-19 DIAGNOSIS — Z823 Family history of stroke: Secondary | ICD-10-CM

## 2020-10-19 DIAGNOSIS — E1122 Type 2 diabetes mellitus with diabetic chronic kidney disease: Secondary | ICD-10-CM | POA: Diagnosis present

## 2020-10-19 DIAGNOSIS — L89220 Pressure ulcer of left hip, unstageable: Secondary | ICD-10-CM | POA: Diagnosis present

## 2020-10-19 DIAGNOSIS — X509XXA Other and unspecified overexertion or strenuous movements or postures, initial encounter: Secondary | ICD-10-CM

## 2020-10-19 DIAGNOSIS — R195 Other fecal abnormalities: Secondary | ICD-10-CM | POA: Diagnosis not present

## 2020-10-19 DIAGNOSIS — I429 Cardiomyopathy, unspecified: Secondary | ICD-10-CM | POA: Diagnosis present

## 2020-10-19 DIAGNOSIS — Z7901 Long term (current) use of anticoagulants: Secondary | ICD-10-CM

## 2020-10-19 DIAGNOSIS — Z66 Do not resuscitate: Secondary | ICD-10-CM | POA: Diagnosis not present

## 2020-10-19 DIAGNOSIS — Z515 Encounter for palliative care: Secondary | ICD-10-CM

## 2020-10-19 DIAGNOSIS — Z87891 Personal history of nicotine dependence: Secondary | ICD-10-CM

## 2020-10-19 DIAGNOSIS — S46011A Strain of muscle(s) and tendon(s) of the rotator cuff of right shoulder, initial encounter: Secondary | ICD-10-CM | POA: Diagnosis present

## 2020-10-19 DIAGNOSIS — L89159 Pressure ulcer of sacral region, unspecified stage: Secondary | ICD-10-CM | POA: Diagnosis present

## 2020-10-19 DIAGNOSIS — M25511 Pain in right shoulder: Secondary | ICD-10-CM

## 2020-10-19 DIAGNOSIS — D500 Iron deficiency anemia secondary to blood loss (chronic): Secondary | ICD-10-CM | POA: Diagnosis present

## 2020-10-19 DIAGNOSIS — R197 Diarrhea, unspecified: Secondary | ICD-10-CM | POA: Diagnosis not present

## 2020-10-19 DIAGNOSIS — I5022 Chronic systolic (congestive) heart failure: Secondary | ICD-10-CM | POA: Diagnosis present

## 2020-10-19 DIAGNOSIS — B3781 Candidal esophagitis: Secondary | ICD-10-CM | POA: Diagnosis present

## 2020-10-19 DIAGNOSIS — K922 Gastrointestinal hemorrhage, unspecified: Secondary | ICD-10-CM | POA: Diagnosis not present

## 2020-10-19 DIAGNOSIS — Q398 Other congenital malformations of esophagus: Secondary | ICD-10-CM

## 2020-10-19 DIAGNOSIS — N400 Enlarged prostate without lower urinary tract symptoms: Secondary | ICD-10-CM | POA: Diagnosis present

## 2020-10-19 DIAGNOSIS — F419 Anxiety disorder, unspecified: Secondary | ICD-10-CM | POA: Diagnosis present

## 2020-10-19 DIAGNOSIS — G4733 Obstructive sleep apnea (adult) (pediatric): Secondary | ICD-10-CM | POA: Diagnosis present

## 2020-10-19 DIAGNOSIS — L989 Disorder of the skin and subcutaneous tissue, unspecified: Secondary | ICD-10-CM | POA: Diagnosis present

## 2020-10-19 DIAGNOSIS — M109 Gout, unspecified: Secondary | ICD-10-CM | POA: Diagnosis present

## 2020-10-19 DIAGNOSIS — R059 Cough, unspecified: Secondary | ICD-10-CM

## 2020-10-19 DIAGNOSIS — D649 Anemia, unspecified: Secondary | ICD-10-CM | POA: Diagnosis not present

## 2020-10-19 DIAGNOSIS — K254 Chronic or unspecified gastric ulcer with hemorrhage: Secondary | ICD-10-CM | POA: Diagnosis not present

## 2020-10-19 DIAGNOSIS — D1809 Hemangioma of other sites: Secondary | ICD-10-CM | POA: Diagnosis present

## 2020-10-19 DIAGNOSIS — Z794 Long term (current) use of insulin: Secondary | ICD-10-CM

## 2020-10-19 DIAGNOSIS — L03115 Cellulitis of right lower limb: Secondary | ICD-10-CM | POA: Diagnosis present

## 2020-10-19 DIAGNOSIS — K229 Disease of esophagus, unspecified: Secondary | ICD-10-CM | POA: Diagnosis present

## 2020-10-19 DIAGNOSIS — R338 Other retention of urine: Secondary | ICD-10-CM | POA: Diagnosis present

## 2020-10-19 DIAGNOSIS — Z20822 Contact with and (suspected) exposure to covid-19: Secondary | ICD-10-CM | POA: Diagnosis present

## 2020-10-19 DIAGNOSIS — I480 Paroxysmal atrial fibrillation: Secondary | ICD-10-CM | POA: Diagnosis present

## 2020-10-19 DIAGNOSIS — K449 Diaphragmatic hernia without obstruction or gangrene: Secondary | ICD-10-CM | POA: Diagnosis present

## 2020-10-19 DIAGNOSIS — R54 Age-related physical debility: Secondary | ICD-10-CM | POA: Diagnosis present

## 2020-10-19 DIAGNOSIS — Z6824 Body mass index (BMI) 24.0-24.9, adult: Secondary | ICD-10-CM

## 2020-10-19 DIAGNOSIS — B961 Klebsiella pneumoniae [K. pneumoniae] as the cause of diseases classified elsewhere: Secondary | ICD-10-CM | POA: Diagnosis present

## 2020-10-19 DIAGNOSIS — Z95810 Presence of automatic (implantable) cardiac defibrillator: Secondary | ICD-10-CM

## 2020-10-19 DIAGNOSIS — X500XXA Overexertion from strenuous movement or load, initial encounter: Secondary | ICD-10-CM

## 2020-10-19 DIAGNOSIS — E440 Moderate protein-calorie malnutrition: Secondary | ICD-10-CM | POA: Diagnosis present

## 2020-10-19 DIAGNOSIS — R7881 Bacteremia: Secondary | ICD-10-CM | POA: Diagnosis present

## 2020-10-19 DIAGNOSIS — R531 Weakness: Secondary | ICD-10-CM

## 2020-10-19 DIAGNOSIS — Z7984 Long term (current) use of oral hypoglycemic drugs: Secondary | ICD-10-CM

## 2020-10-19 DIAGNOSIS — N390 Urinary tract infection, site not specified: Secondary | ICD-10-CM | POA: Diagnosis present

## 2020-10-19 DIAGNOSIS — N401 Enlarged prostate with lower urinary tract symptoms: Secondary | ICD-10-CM | POA: Diagnosis present

## 2020-10-19 DIAGNOSIS — E8809 Other disorders of plasma-protein metabolism, not elsewhere classified: Secondary | ICD-10-CM | POA: Diagnosis present

## 2020-10-19 DIAGNOSIS — R682 Dry mouth, unspecified: Secondary | ICD-10-CM | POA: Diagnosis not present

## 2020-10-19 DIAGNOSIS — G9529 Other cord compression: Secondary | ICD-10-CM | POA: Diagnosis present

## 2020-10-19 DIAGNOSIS — Z8249 Family history of ischemic heart disease and other diseases of the circulatory system: Secondary | ICD-10-CM

## 2020-10-19 DIAGNOSIS — E876 Hypokalemia: Secondary | ICD-10-CM | POA: Diagnosis present

## 2020-10-19 DIAGNOSIS — Z79899 Other long term (current) drug therapy: Secondary | ICD-10-CM

## 2020-10-19 DIAGNOSIS — G8220 Paraplegia, unspecified: Secondary | ICD-10-CM | POA: Diagnosis present

## 2020-10-19 DIAGNOSIS — R7401 Elevation of levels of liver transaminase levels: Secondary | ICD-10-CM | POA: Diagnosis present

## 2020-10-19 DIAGNOSIS — L03116 Cellulitis of left lower limb: Secondary | ICD-10-CM | POA: Diagnosis present

## 2020-10-19 DIAGNOSIS — N1831 Chronic kidney disease, stage 3a: Secondary | ICD-10-CM | POA: Diagnosis present

## 2020-10-19 DIAGNOSIS — K219 Gastro-esophageal reflux disease without esophagitis: Secondary | ICD-10-CM | POA: Diagnosis present

## 2020-10-19 DIAGNOSIS — K259 Gastric ulcer, unspecified as acute or chronic, without hemorrhage or perforation: Secondary | ICD-10-CM

## 2020-10-19 DIAGNOSIS — M19011 Primary osteoarthritis, right shoulder: Secondary | ICD-10-CM | POA: Diagnosis present

## 2020-10-19 DIAGNOSIS — I130 Hypertensive heart and chronic kidney disease with heart failure and stage 1 through stage 4 chronic kidney disease, or unspecified chronic kidney disease: Secondary | ICD-10-CM | POA: Diagnosis present

## 2020-10-19 LAB — URINALYSIS, ROUTINE W REFLEX MICROSCOPIC
Bilirubin Urine: NEGATIVE
Glucose, UA: NEGATIVE mg/dL
Ketones, ur: NEGATIVE mg/dL
Nitrite: NEGATIVE
Protein, ur: 30 mg/dL — AB
Specific Gravity, Urine: 1.012 (ref 1.005–1.030)
pH: 5 (ref 5.0–8.0)

## 2020-10-19 LAB — CBC WITH DIFFERENTIAL/PLATELET
Abs Immature Granulocytes: 0.06 10*3/uL (ref 0.00–0.07)
Basophils Absolute: 0 10*3/uL (ref 0.0–0.1)
Basophils Relative: 0 %
Eosinophils Absolute: 0 10*3/uL (ref 0.0–0.5)
Eosinophils Relative: 0 %
HCT: 24.8 % — ABNORMAL LOW (ref 39.0–52.0)
Hemoglobin: 8.2 g/dL — ABNORMAL LOW (ref 13.0–17.0)
Immature Granulocytes: 1 %
Lymphocytes Relative: 4 %
Lymphs Abs: 0.2 10*3/uL — ABNORMAL LOW (ref 0.7–4.0)
MCH: 29.2 pg (ref 26.0–34.0)
MCHC: 33.1 g/dL (ref 30.0–36.0)
MCV: 88.3 fL (ref 80.0–100.0)
Monocytes Absolute: 0.2 10*3/uL (ref 0.1–1.0)
Monocytes Relative: 3 %
Neutro Abs: 5.3 10*3/uL (ref 1.7–7.7)
Neutrophils Relative %: 92 %
Platelets: 106 10*3/uL — ABNORMAL LOW (ref 150–400)
RBC: 2.81 MIL/uL — ABNORMAL LOW (ref 4.22–5.81)
RDW: 18.1 % — ABNORMAL HIGH (ref 11.5–15.5)
WBC: 5.8 10*3/uL (ref 4.0–10.5)
nRBC: 0 % (ref 0.0–0.2)

## 2020-10-19 LAB — COMPREHENSIVE METABOLIC PANEL
ALT: 69 U/L — ABNORMAL HIGH (ref 0–44)
AST: 32 U/L (ref 15–41)
Albumin: 1.7 g/dL — ABNORMAL LOW (ref 3.5–5.0)
Alkaline Phosphatase: 43 U/L (ref 38–126)
Anion gap: 7 (ref 5–15)
BUN: 40 mg/dL — ABNORMAL HIGH (ref 8–23)
CO2: 27 mmol/L (ref 22–32)
Calcium: 9.4 mg/dL (ref 8.9–10.3)
Chloride: 100 mmol/L (ref 98–111)
Creatinine, Ser: 1.38 mg/dL — ABNORMAL HIGH (ref 0.61–1.24)
GFR, Estimated: 52 mL/min — ABNORMAL LOW (ref >60)
Glucose, Bld: 121 mg/dL — ABNORMAL HIGH (ref 70–99)
Potassium: 3.6 mmol/L (ref 3.5–5.1)
Sodium: 134 mmol/L — ABNORMAL LOW (ref 135–145)
Total Bilirubin: 0.9 mg/dL (ref 0.3–1.2)
Total Protein: 4.9 g/dL — ABNORMAL LOW (ref 6.5–8.1)

## 2020-10-19 LAB — RETICULOCYTES
Immature Retic Fract: 25.3 % — ABNORMAL HIGH (ref 2.3–15.9)
RBC.: 2.78 MIL/uL — ABNORMAL LOW (ref 4.22–5.81)
Retic Count, Absolute: 38.1 10*3/uL (ref 19.0–186.0)
Retic Ct Pct: 1.4 % (ref 0.4–3.1)

## 2020-10-19 LAB — IRON AND TIBC
Iron: 18 ug/dL — ABNORMAL LOW (ref 45–182)
Saturation Ratios: 15 % — ABNORMAL LOW (ref 17.9–39.5)
TIBC: 124 ug/dL — ABNORMAL LOW (ref 250–450)
UIBC: 106 ug/dL

## 2020-10-19 LAB — GLUCOSE, CAPILLARY
Glucose-Capillary: 91 mg/dL (ref 70–99)
Glucose-Capillary: 72 mg/dL (ref 70–99)

## 2020-10-19 LAB — RESP PANEL BY RT-PCR (FLU A&B, COVID) ARPGX2
Influenza A by PCR: NEGATIVE
Influenza B by PCR: NEGATIVE
SARS Coronavirus 2 by RT PCR: NEGATIVE

## 2020-10-19 LAB — URIC ACID: Uric Acid, Serum: 6.6 mg/dL (ref 3.7–8.6)

## 2020-10-19 LAB — FERRITIN: Ferritin: 809 ng/mL — ABNORMAL HIGH (ref 24–336)

## 2020-10-19 LAB — PROTIME-INR
INR: 2.2 — ABNORMAL HIGH (ref 0.8–1.2)
Prothrombin Time: 24.1 seconds — ABNORMAL HIGH (ref 11.4–15.2)

## 2020-10-19 LAB — PREALBUMIN: Prealbumin: 8.7 mg/dL — ABNORMAL LOW (ref 18–38)

## 2020-10-19 LAB — APTT: aPTT: 42 seconds — ABNORMAL HIGH (ref 24–36)

## 2020-10-19 LAB — HEMOGLOBIN AND HEMATOCRIT, BLOOD
HCT: 23.9 % — ABNORMAL LOW (ref 39.0–52.0)
Hemoglobin: 7.8 g/dL — ABNORMAL LOW (ref 13.0–17.0)

## 2020-10-19 LAB — LACTIC ACID, PLASMA
Lactic Acid, Venous: 1 mmol/L (ref 0.5–1.9)
Lactic Acid, Venous: 1.1 mmol/L (ref 0.5–1.9)

## 2020-10-19 LAB — VITAMIN B12: Vitamin B-12: 1314 pg/mL — ABNORMAL HIGH (ref 180–914)

## 2020-10-19 LAB — FOLATE: Folate: 18.6 ng/mL (ref >5.9)

## 2020-10-19 LAB — POC OCCULT BLOOD, ED: Fecal Occult Bld: POSITIVE — AB

## 2020-10-19 MED ORDER — DIGOXIN 125 MCG PO TABS
0.1250 mg | ORAL_TABLET | Freq: Every day | ORAL | Status: DC
  Administered 2020-10-20 – 2020-10-22 (×3): 0.125 mg via ORAL
  Filled 2020-10-19 (×4): qty 1

## 2020-10-19 MED ORDER — SOTALOL HCL 80 MG PO TABS
80.0000 mg | ORAL_TABLET | Freq: Every day | ORAL | Status: DC
  Administered 2020-10-20 – 2020-10-25 (×5): 80 mg via ORAL
  Filled 2020-10-19 (×7): qty 1

## 2020-10-19 MED ORDER — CARVEDILOL 12.5 MG PO TABS
12.5000 mg | ORAL_TABLET | Freq: Two times a day (BID) | ORAL | Status: DC
  Administered 2020-10-19 – 2020-10-22 (×7): 12.5 mg via ORAL
  Filled 2020-10-19 (×8): qty 1

## 2020-10-19 MED ORDER — PANTOPRAZOLE SODIUM 40 MG IV SOLR
40.0000 mg | Freq: Two times a day (BID) | INTRAVENOUS | Status: DC
  Administered 2020-10-19 – 2020-10-22 (×7): 40 mg via INTRAVENOUS
  Filled 2020-10-19 (×7): qty 40

## 2020-10-19 MED ORDER — ONDANSETRON HCL 4 MG PO TABS: 4.0000 mg | ORAL_TABLET | Freq: Four times a day (QID) | ORAL | Status: DC | PRN

## 2020-10-19 MED ORDER — VANCOMYCIN HCL 1000 MG/200ML IV SOLN
1000.0000 mg | INTRAVENOUS | Status: DC
  Administered 2020-10-20: 1000 mg via INTRAVENOUS
  Filled 2020-10-19: qty 200

## 2020-10-19 MED ORDER — SOTALOL HCL 120 MG PO TABS
120.0000 mg | ORAL_TABLET | Freq: Every day | ORAL | Status: DC
  Administered 2020-10-19 – 2020-10-25 (×7): 120 mg via ORAL
  Filled 2020-10-19 (×7): qty 1

## 2020-10-19 MED ORDER — LACTATED RINGERS IV BOLUS (SEPSIS)
500.0000 mL | Freq: Once | INTRAVENOUS | Status: AC
  Administered 2020-10-19: 500 mL via INTRAVENOUS

## 2020-10-19 MED ORDER — SODIUM CHLORIDE 0.9 % IV SOLN
80.0000 mg | Freq: Once | INTRAVENOUS | Status: AC
  Administered 2020-10-19: 80 mg via INTRAVENOUS
  Filled 2020-10-19: qty 80

## 2020-10-19 MED ORDER — ACETAMINOPHEN 650 MG RE SUPP: 650.0000 mg | Freq: Four times a day (QID) | RECTAL | Status: DC | PRN

## 2020-10-19 MED ORDER — INSULIN ASPART 100 UNIT/ML ~~LOC~~ SOLN
0.0000 [IU] | Freq: Every day | SUBCUTANEOUS | Status: DC
  Administered 2020-10-20 – 2020-10-21 (×2): 3 [IU] via SUBCUTANEOUS
  Administered 2020-10-22: 4 [IU] via SUBCUTANEOUS

## 2020-10-19 MED ORDER — SOTALOL HCL 80 MG PO TABS: 80.0000 mg | ORAL_TABLET | ORAL | Status: DC

## 2020-10-19 MED ORDER — SODIUM CHLORIDE 0.9 % IV SOLN
2.0000 g | Freq: Two times a day (BID) | INTRAVENOUS | Status: DC
  Administered 2020-10-19 – 2020-10-20 (×3): 2 g via INTRAVENOUS
  Filled 2020-10-19 (×3): qty 2

## 2020-10-19 MED ORDER — VANCOMYCIN HCL IN DEXTROSE 1-5 GM/200ML-% IV SOLN
1000.0000 mg | Freq: Once | INTRAVENOUS | Status: AC
  Administered 2020-10-19: 1000 mg via INTRAVENOUS
  Filled 2020-10-19: qty 200

## 2020-10-19 MED ORDER — PEG-KCL-NACL-NASULF-NA ASC-C 100 G PO SOLR
1.0000 | Freq: Once | ORAL | Status: AC
  Administered 2020-10-20: 200 g via ORAL
  Filled 2020-10-19 (×2): qty 1

## 2020-10-19 MED ORDER — ACETAMINOPHEN 325 MG PO TABS: 650.0000 mg | ORAL_TABLET | Freq: Four times a day (QID) | ORAL | Status: DC | PRN

## 2020-10-19 MED ORDER — ATORVASTATIN CALCIUM 40 MG PO TABS
40.0000 mg | ORAL_TABLET | ORAL | Status: DC
  Administered 2020-10-19: 40 mg via ORAL
  Filled 2020-10-19: qty 1

## 2020-10-19 MED ORDER — DEXAMETHASONE 4 MG PO TABS
12.0000 mg | ORAL_TABLET | Freq: Three times a day (TID) | ORAL | Status: DC
  Administered 2020-10-19 – 2020-10-23 (×13): 12 mg via ORAL
  Filled 2020-10-19 (×14): qty 3

## 2020-10-19 MED ORDER — INSULIN ASPART 100 UNIT/ML ~~LOC~~ SOLN
0.0000 [IU] | Freq: Three times a day (TID) | SUBCUTANEOUS | Status: DC
  Administered 2020-10-21: 5 [IU] via SUBCUTANEOUS
  Administered 2020-10-21: 8 [IU] via SUBCUTANEOUS
  Administered 2020-10-21: 5 [IU] via SUBCUTANEOUS
  Administered 2020-10-22: 3 [IU] via SUBCUTANEOUS
  Administered 2020-10-22: 8 [IU] via SUBCUTANEOUS
  Administered 2020-10-22: 3 [IU] via SUBCUTANEOUS
  Administered 2020-10-23: 5 [IU] via SUBCUTANEOUS
  Administered 2020-10-23: 3 [IU] via SUBCUTANEOUS

## 2020-10-19 MED ORDER — ONDANSETRON HCL 4 MG/2ML IJ SOLN: 4.0000 mg | Freq: Four times a day (QID) | INTRAMUSCULAR | Status: DC | PRN

## 2020-10-19 MED ORDER — TAMSULOSIN HCL 0.4 MG PO CAPS
0.4000 mg | ORAL_CAPSULE | Freq: Every day | ORAL | Status: DC
  Administered 2020-10-20 – 2020-10-25 (×6): 0.4 mg via ORAL
  Filled 2020-10-19 (×6): qty 1

## 2020-10-19 MED ORDER — ACETAMINOPHEN 500 MG PO TABS
1000.0000 mg | ORAL_TABLET | Freq: Once | ORAL | Status: AC
  Administered 2020-10-19: 1000 mg via ORAL
  Filled 2020-10-19: qty 2

## 2020-10-19 MED ORDER — LACTATED RINGERS IV SOLN: INTRAVENOUS | Status: DC

## 2020-10-19 MED ORDER — FINASTERIDE 5 MG PO TABS
5.0000 mg | ORAL_TABLET | Freq: Every day | ORAL | Status: DC
  Administered 2020-10-19 – 2020-10-23 (×5): 5 mg via ORAL
  Filled 2020-10-19 (×6): qty 1

## 2020-10-19 MED ORDER — VANCOMYCIN HCL 750 MG/150ML IV SOLN
750.0000 mg | Freq: Once | INTRAVENOUS | Status: AC
  Administered 2020-10-19: 750 mg via INTRAVENOUS
  Filled 2020-10-19: qty 150

## 2020-10-19 NOTE — Progress Notes (Signed)
Pharmacy Antibiotic Note  Timothy Neal is a 80 y.o. male with a h/o spinal hemangioma  admitted on 10/05/2020 with weakness.  Pharmacy has been consulted for vancomycin and cefepime dosing for left hand and BLE cellulitis and sacral wound.  Plan: Vancomycin 1000 mg IV Q 24 hrs (following 1750 mg load). Goal AUC 400-550. Expected AUC: 511 SCr used: 1.38      Temp (24hrs), Avg:98.8 F (37.1 C), Min:98.1 F (36.7 C), Max:100.3 F (37.9 C)  Recent Labs  Lab 10/12/2020 1013 10/25/2020 1213  WBC 5.8  --   CREATININE 1.38*  --   LATICACIDVEN 1.0 1.1    CrCl cannot be calculated (Unknown ideal weight.).    No Known Allergies  Antimicrobials this admission: 3/21 vanc >>  3/21 cefepime >>   Dose adjustments this admission:   Microbiology results: 3/21 BCx:  3/21 UCx:   3/27 Resp PCR: covid neg, flu neg  Thank you for allowing pharmacy to be a part of this patient's care.  Ulice Dash D 10/18/2020 2:18 PM

## 2020-10-19 NOTE — ED Triage Notes (Signed)
Pt resides at [facility] in [place] per sister

## 2020-10-19 NOTE — ED Triage Notes (Signed)
Pt c/o right shoulder pain. Denies trauma or injury. 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 Sister in route she is primary caregiver.

## 2020-10-19 NOTE — ED Provider Notes (Signed)
Emergency Department Provider Note   I have reviewed the triage vital signs and the nursing notes.   HISTORY  Chief Complaint Arm Injury   HPI Timothy Neal is a 80 y.o. 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  Patient has been undergoing radiation for his spine hemangioma.  He is not on chemotherapy.  He denies any pain in the neck or head.  His right arm does not feel weak but shoulder is very painful.  Denies falls.  No abdominal pain, chest pain, shortness of breath.  No URI symptoms.  He does have sacral wounds developing as well as redness to the buttocks area.  He notes symptoms have been worsening over the weekend he is being cared for by family with nursing experience at [facility] but symptoms worsening in the past 24 hours prompted his ED visit.  The patient's sister arrives at bedside has been caring for him.  She corrects me and states that he is in independent living at [facility].  She states that as of last week he was able to walk using a walker approximately 15 yards with minimal assistance.  Since Friday he has been unable to get up and walk.  He was sleepy yesterday with some hypoglycemia despite blood sugars otherwise being elevated after he started steroid medications.  She states that he felt warm this morning and she was concerned for fever but had not felt warm earlier.  She states in terms of the right shoulder he has been trying to lift and pull himself up over the past several days and thinks he may have injured it during that process.  Past Medical History:  Diagnosis Date  . Cardiomyopathy ([place]) 08/26/2014  . Chronic kidney disease, stage 3 unspecified ([place]) 04/20/2016  . Chronic systolic heart failure ([place]) 09/23/2014  . Coronary artery disease  04/20/2016  . Diabetes mellitus, type II ([place]) 08/26/2014   Last Assessment & Plan:  Formatting of this note might be different from the original. Home regimen.  . Dyslipidemia 04/20/2016  . History of gout 04/20/2016  . Obstructive sleep apnea (adult) (pediatric) 04/20/2016   Formatting of this note might be different from the original. does not use CPAP  . Paroxysmal atrial fibrillation ([place]) 04/20/2016   Last Assessment & Plan:  Formatting of this note might be different from the original. Continue sotalol, carvedilol. Hold Apixaban until PCP follow up, if h/H remains stable apixaban to be resumed.  . 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 statin, beta-blocker, ACE inhibitor  . 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 MDT BiV ICD implanted 09/23/14  . Spinal cord compression ([place]) 09/19/2020    Patient Active Problem List   Diagnosis Date Noted  . Multiple gastric ulcers   . Hiatal hernia   . GIB (gastrointestinal bleeding) 10/24/2020  . Symptomatic anemia   . Heme positive stool   . Hemangioma 09/23/2020  . Spinal cord compression ([place]) 09/19/2020  . CKD (chronic kidney disease), stage IV ([place]) 09/19/2020  . Controlled type 2 diabetes mellitus with hyperglycemia ([place]) 09/19/2020  . HLD (hyperlipidemia) 09/19/2020  . Hypothyroidism 09/19/2020  . Chronic kidney disease, stage  3 unspecified ([place]) 04/20/2016  . Coronary artery disease 04/20/2016  . Dyslipidemia 04/20/2016  . History of gout 04/20/2016  . Obstructive sleep apnea (adult) (pediatric) 04/20/2016  . Paroxysmal atrial fibrillation ([place]) 04/20/2016  . Chronic systolic heart failure ([place]) 09/23/2014  . 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 Cardiomyopathy ([place]) 08/26/2014  . Diabetes mellitus, type II ([place]) 08/26/2014  .  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 CORONARY ARTERY BYPASS GRAFT    . HERNIA REPAIR      Allergies Patient has no known allergies.  Family History  Problem Relation Age of Onset  . Heart attack Mother   . Diabetes Mother   . Heart attack Father   . Stroke Father   . Diabetes Brother     Social History Social History   Tobacco Use  . Smoking status: Former Research scientist (life sciences)  . Smokeless tobacco: Never Used    Review of Systems  Constitutional: Positive fever/chills along with generalized weakness.  Eyes: No visual changes. ENT: No sore throat. Cardiovascular: Denies chest pain. Respiratory: Denies shortness of breath. Gastrointestinal: No abdominal pain.  No nausea, no vomiting.  No diarrhea.  No constipation. Genitourinary: Patient self-cath at ALF.  Musculoskeletal: Negative for back pain. Positive right shoulder pain.  Skin: Rash to the left hand and  Sacrum/buttock area.  Neurological: Negative for headaches. Baseline paraplegia in the lower extremities.   10-point ROS otherwise negative.  ____________________________________________   PHYSICAL EXAM:  VITAL SIGNS: ED Triage Vitals  Enc Vitals Group     BP 10/28/2020 0956 (!) 116/56     Pulse Rate 10/05/2020 0956 70     Resp 10/27/2020 0956 14     Temp 10/29/2020 0956 100.3 F (37.9 C)     Temp Source 10/08/2020 0956 Oral     SpO2 10/22/2020 0956 96 %   Constitutional: Alert and oriented. Well appearing and in no acute distress. Eyes: Conjunctivae are normal.  Head: Atraumatic. Nose: No congestion/rhinnorhea. Mouth/Throat: Mucous membranes are moist.  Oropharynx non-erythematous. Neck: No stridor.   Cardiovascular: Normal rate, regular rhythm. Good peripheral circulation. Grossly normal heart sounds.   Respiratory: Normal respiratory effort.  No retractions. Lungs CTAB. Gastrointestinal: Soft and nontender. No distention.  Musculoskeletal: Severe  pain with even passive range of motion of the right shoulder.  There is no warmth or redness over the right shoulder.  No tenderness of the right elbow or wrist. No midline cervical spine tenderness.  Neurologic:  Normal speech and language.  Patient has weak (4+/5) but equal grip strength in the bilateral upper extremity. 3/5 strength in the bilateral LEs slightly worse on the left with normal sensation.   Skin: Erythema with mild swelling to the dorsum of the left hand.  Patient also with sacral and gluteal wounds with dressings in place.  In removing the dressings the patient has some developing ulcerations in these areas.  There is some surrounding erythema and warmth to the area.    ____________________________________________   LABS (all labs ordered are listed, but only abnormal results are displayed)  Labs Reviewed  URINE CULTURE - Abnormal; Notable for the following components:      Result Value   Culture   (*)    Value: >=100,000 COLONIES/mL GRAM NEGATIVE RODS SUSCEPTIBILITIES TO FOLLOW Performed at [facility], [place] [address]  [place], [place] [address]    All other components within normal limits  BLOOD CULTURE  ID PANEL (REFLEXED) - BCID2 - Abnormal; Notable for the following components:   Staphylococcus species DETECTED (*)    Staphylococcus aureus (BCID) DETECTED (*)    Meth resistant mecA/C and MREJ DETECTED (*)    All other components within normal limits  COMPREHENSIVE METABOLIC PANEL - Abnormal; Notable for the following components:   Sodium 134 (*)    Glucose, Bld 121 (*)    BUN 40 (*)    Creatinine, Ser 1.38 (*)    Total Protein 4.9 (*)    Albumin 1.7 (*)    ALT 69 (*)    GFR, Estimated 52 (*)    All other components within normal limits  CBC WITH DIFFERENTIAL/PLATELET - Abnormal; Notable for the following components:   RBC 2.81 (*)    Hemoglobin 8.2 (*)    HCT 24.8 (*)    RDW 18.1 (*)    Platelets 106 (*)    Lymphs Abs 0.2 (*)    All other  components within normal limits  PROTIME-INR - Abnormal; Notable for the following components:   Prothrombin Time 24.1 (*)    INR 2.2 (*)    All other components within normal limits  APTT - Abnormal; Notable for the following components:   aPTT 42 (*)    All other components within normal limits  URINALYSIS, ROUTINE W REFLEX MICROSCOPIC - Abnormal; Notable for the following components:   Hgb urine dipstick SMALL (*)    Protein, ur 30 (*)    Leukocytes,Ua SMALL (*)    Bacteria, UA RARE (*)    All other components within normal limits  VITAMIN B12 - Abnormal; Notable for the following components:   Vitamin B-12 1,314 (*)    All other components within normal limits  IRON AND TIBC - Abnormal; Notable for the following components:   Iron 18 (*)    TIBC 124 (*)    Saturation Ratios 15 (*)    All other components within normal limits  FERRITIN - Abnormal; Notable for the following components:   Ferritin 809 (*)    All other components within normal limits  RETICULOCYTES - Abnormal; Notable for the following components:   RBC. 2.78 (*)    Immature Retic Fract 25.3 (*)    All other components within normal limits  HEMOGLOBIN AND HEMATOCRIT, BLOOD - Abnormal; Notable for the following components:   Hemoglobin 7.8 (*)    HCT 23.9 (*)    All other components within normal limits  PREALBUMIN - Abnormal; Notable for the following components:   Prealbumin 8.7 (*)    All other components within normal limits  COMPREHENSIVE METABOLIC PANEL - Abnormal; Notable for the following components:   Sodium 132 (*)    Glucose, Bld 104 (*)    BUN 39 (*)    Creatinine, Ser 1.29 (*)    Total Protein 4.9 (*)    Albumin 1.7 (*)    ALT 69 (*)    GFR, Estimated 57 (*)    All other components within normal limits  CBC - Abnormal; Notable for the following components:   RBC 2.78 (*)    Hemoglobin 8.2 (*)    HCT 24.5 (*)    RDW 18.2 (*)    Platelets 109 (*)    All other components within normal  limits  POC OCCULT BLOOD, ED - Abnormal; Notable for the following components:   Fecal Occult Bld POSITIVE (*)    All other components within normal limits  RESP PANEL BY RT-PCR (FLU  A&B, COVID) ARPGX2  CULTURE, BLOOD (ROUTINE X 2)  CULTURE, BLOOD (ROUTINE X 2)  LACTIC ACID, PLASMA  LACTIC ACID, PLASMA  FOLATE  URIC ACID  GLUCOSE, CAPILLARY  GLUCOSE, CAPILLARY  GLUCOSE, CAPILLARY  GLUCOSE, CAPILLARY  CBC  CBC  SURGICAL PATHOLOGY   ____________________________________________  EKG   EKG Interpretation  Date/Time:  Monday October 19 2020 09:51:33 EDT Ventricular Rate:  70 PR Interval:    QRS Duration: 164 QT Interval:  457 QTC Calculation: 494 R Axis:   -69 Text Interpretation: V-paced rhythm. No old tracing for comparison. Confirmed by Nanda Quinton [phone]) on 10/14/2020 10:24:52 AM       ____________________________________________  RADIOLOGY  CXR and right shoulder plain films reviewed. No acute findings.  ____________________________________________   PROCEDURES  Procedure(s) performed:   Procedures  CRITICAL CARE Performed by: Margette Fast Total critical care time: 35 minutes Critical care time was exclusive of separately billable procedures and treating other patients. Critical care was necessary to treat or prevent imminent or life-threatening deterioration. Critical care was time spent personally by me on the following activities: development of treatment plan with patient and/or surrogate as well as nursing, discussions with consultants, evaluation of patient's response to treatment, examination of patient, obtaining history from patient or surrogate, ordering and performing treatments and interventions, ordering and review of laboratory studies, ordering and review of radiographic studies, pulse oximetry and re-evaluation of patient's condition.  Nanda Quinton, MD Emergency Medicine  ____________________________________________   INITIAL IMPRESSION /  ASSESSMENT AND PLAN / ED COURSE  Pertinent labs & imaging results that were available during my care of the patient were reviewed by me and considered in my medical decision making (see chart for details).   Patient presents to the ED with fever, weakness, right shoulder pain. No outward findings to suspect large shoulder effusion or septic joint. Plan for plain films of the shoulder and chest. Febrile here. Will send blood and urine cultures. EKG showing V-paced rhythm.  Sepsis work-up initiated.  Patient's lower extremity weakness does appear worse than initial progress he was showing as recently as this past week according to his sister.  Labs reviewed showing worsening anemia now in the range of 8 down from 11-12 range at discharge.  His Hemoccult here is positive but stool is [place].  No melena or bright red blood.  No evidence of urinary tract infection.  Suspect fever is coming from skin sources of infection including bedsores as well as some erythema of the left hand.  Plain films of the shoulder and chest are unremarkable.  COVID and flu are negative.  Spoke with Dr. Arnoldo Morale with neurosurgery.  Overall, patient is a very poor operative candidate.  There is significant risk of intraoperative bleeding if the hemangioma is operated upon.  He had an extensive discussion with the patient during his last admission.  He agrees with repeat MRI but admits that his surgical options are very limited.   GI to consult on patient.   Discussed patient's case with [facility] to request admission. Patient and family (if present) updated with plan. Care transferred to [facility] service.  I reviewed all nursing notes, vitals, pertinent old records, EKGs, labs, imaging (as available).  ____________________________________________  FINAL CLINICAL IMPRESSION(S) / ED DIAGNOSES  Final diagnoses:  Symptomatic anemia  Acute pain of right shoulder  Generalized weakness     MEDICATIONS GIVEN DURING THIS  VISIT:  Medications  atorvastatin (LIPITOR) tablet 40 mg ( Oral Automatically Held 11/04/20 2200)  carvedilol (COREG)  tablet 12.5 mg ( Oral Automatically Held 11/04/20 2200)  digoxin (LANOXIN) tablet 0.125 mg ( Oral Automatically Held 10/28/20 1000)  finasteride (PROSCAR) tablet 5 mg ( Oral Automatically Held 11/04/20 2200)  tamsulosin (FLOMAX) capsule 0.4 mg ( Oral Automatically Held 10/28/20 1000)  acetaminophen (TYLENOL) tablet 650 mg ( Oral MAR Hold 10/18/2020 1155)    Or  acetaminophen (TYLENOL) suppository 650 mg ( Rectal MAR Hold 10/25/2020 1155)  ondansetron (ZOFRAN) tablet 4 mg ( Oral MAR Hold 10/01/2020 1155)    Or  ondansetron (ZOFRAN) injection 4 mg ( Intravenous MAR Hold 10/16/2020 1155)  dexamethasone (DECADRON) tablet 12 mg ( Oral Automatically Held 11/04/20 2200)  insulin aspart (novoLOG) injection 0-15 Units ( Subcutaneous Automatically Held 11/04/20 1700)  insulin aspart (novoLOG) injection 0-5 Units ( Subcutaneous Automatically Held 11/04/20 2200)  vancomycin (VANCOREADY) IVPB 1000 mg/200 mL ( Intravenous Automatically Held 10/28/20 1500)  ceFEPIme (MAXIPIME) 2 g in sodium chloride 0.9 % 100 mL IVPB ( Intravenous Automatically Held 11/04/20 1500)  sotalol (BETAPACE) tablet 80 mg ( Oral Automatically Held 10/28/20 1000)    And  sotalol (BETAPACE) tablet 120 mg ( Oral Automatically Held 11/04/20 2200)  pantoprazole (PROTONIX) injection 40 mg ( Intravenous Automatically Held 11/04/20 2200)  lip balm (CARMEX) ointment (has no administration in time range)  lactated ringers infusion ( Intravenous Anesthesia Volume Adjustment 09/29/2020 1325)  acetaminophen (TYLENOL) tablet 1,000 mg (1,000 mg Oral Given 09/29/2020 1033)  lactated ringers bolus 500 mL (0 mLs Intravenous Stopped 10/28/2020 1143)  vancomycin (VANCOCIN) IVPB 1000 mg/200 mL premix (1,000 mg Intravenous New Bag/Given 10/29/2020 1215)  pantoprazole (PROTONIX) 80 mg in sodium chloride 0.9 % 100 mL IVPB (80 mg Intravenous New Bag/Given 10/01/2020 1514)  vancomycin  (VANCOREADY) IVPB 750 mg/150 mL (750 mg Intravenous New Bag/Given 10/24/2020 1512)  peg 3350 powder (MOVIPREP) kit 200 g (200 g Oral Given 10/07/2020 0543)    Note:  This document was prepared using Dragon voice recognition software and may include unintentional dictation errors.  Nanda Quinton, MD, [facility] Emergency Medicine    Shady Bradish, Wonda Olds, MD 10/09/2020 [phone]

## 2020-10-19 NOTE — Progress Notes (Signed)
A consult was received from an ED physician for vancomycin per pharmacy dosing (for an indication other than meningitis). The patient's profile has been reviewed for ht/wt/allergies/indication/available labs. A one time order has been placed for the above antibiotics.  Further antibiotics/pharmacy consults should be ordered by admitting physician if indicated.                       Reuel Boom, PharmD, BCPS [phone] 10/04/2020, 12:07 PM

## 2020-10-19 NOTE — H&P (Signed)
History and Physical    Timothy Neal QMG:[id number] DOB: Oct 19, 1940 DOA: 10/09/2020  PCP: [name], MD  Patient coming from: Timothy Neal  Chief Complaint: weakness  HPI: Timothy Neal is a 80 y.o. 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 Presenting with increased weakness over the last week. 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 He normally is able to use a walker to mover at least 15 yards per his sister at bedside. Over the last week, he's had increasing overall weakness and lethargy; as well as an acute inability to use his walker or move on his own. His sister reports that his acute decline started on Friday. He was no longer able to use his walker and had minimal strength to move at all. She notes that his sacral wound seems worse during that time and that he may have had some fevers. He c/o on shoulder pain, but she notes that he had been putting extra strain on that right shoulder. She believes that he may have injured it during those exercises. When his symptoms did not improve this morning, they decided they needed to come to the ED.    ED Course: He was found to have a 3.5 pt drop in his Hgb and he was FOBT positive. GI was consulted. There was concern for worsening BLE weakness. Neurosurgery was consulted. Cellulitis was noted. Patient was started on vanc. TRH was called for admission.   Review of Systems:  Denies CP, dyspnea, palpitations, N/V/D, hematochezia. Reports darker stools, but no frank blood. Review of systems is otherwise negative for all not mentioned in HPI.   PMHx Past Medical History:  Diagnosis Date  . Cardiomyopathy ([place]) 08/26/2014  . Chronic kidney disease, stage 3 unspecified ([place]) 04/20/2016  . Chronic systolic heart failure ([place]) 09/23/2014  . Coronary artery disease 04/20/2016  . Diabetes mellitus,  type II ([place]) 08/26/2014   Last Assessment & Plan:  Formatting of this note might be different from the original. Home regimen.  . Dyslipidemia 04/20/2016  . History of gout 04/20/2016  . Obstructive sleep apnea (adult) (pediatric) 04/20/2016   Formatting of this note might be different from the original. does not use CPAP  . Paroxysmal atrial fibrillation ([place]) 04/20/2016   Last Assessment & Plan:  Formatting of this note might be different from the original. Continue sotalol, carvedilol. Hold Apixaban until PCP follow up, if h/H remains stable apixaban to be resumed.  . 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 statin, beta-blocker, ACE inhibitor  . 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 MDT BiV ICD implanted 09/23/14  . Spinal cord compression ([place]) 09/19/2020    PSHx Past Surgical History:  Procedure Laterality Date  . CORONARY ARTERY BYPASS GRAFT    . HERNIA REPAIR      Soc[facility]x  reports that he has quit smoking. He has never used smokeless tobacco. No history on file for alcohol use and drug use.  No Known Allergies  FamHx Family History  Problem Relation Age of Onset  . Heart attack Mother   . Diabetes Mother   . Heart attack Father   . Stroke Father   . Diabetes Brother     Prior  to Admission medications   Medication Sig Start Date End Date Taking? Authorizing Provider  atorvastatin (LIPITOR) 40 MG tablet Take 40 mg by mouth every Monday, Wednesday, and Friday. At bedtime    [provider]  carvedilol (COREG) 12.5 MG tablet Take 12.5 mg by mouth 2 (two) times daily. 02/14/17   [provider]  Cholecalciferol 25 MCG (1000 UT) capsule Take 1,000 Units by mouth daily.    [provider]  Coenzyme Q10 200 MG capsule Take 200 mg by mouth daily.    [provider]   dexamethasone (DECADRON) 4 MG tablet Take 1 tablet (4 mg total) by mouth daily for 25 days. Take as prescribed according to instructions given for reducing the dose. 10/14/20 11/08/20  Hayden Pedro, PA-C  digoxin (LANOXIN) 0.125 MG tablet Take 0.125 mg by mouth daily. 09/15/20   [provider]  ELIQUIS 2.5 MG TABS tablet Take 2.5 mg by mouth 2 (two) times daily. 08/25/20   [provider]  enalapril (VASOTEC) 2.5 MG tablet Take 2.5 mg by mouth 2 (two) times daily.    [provider]  ferrous sulfate 325 (65 FE) MG tablet Take 325 mg by mouth every Monday, Wednesday, and Friday.    [provider]  finasteride (PROSCAR) 5 MG tablet Take 5 mg by mouth at bedtime. 09/15/20   [provider]  furosemide (LASIX) 40 MG tablet Take 20 mg by mouth daily.    [provider]  glipiZIDE (GLUCOTROL) 5 MG tablet Take 5 mg by mouth 2 (two) times daily. 09/04/20   [provider]  isosorbide mononitrate (IMDUR) 30 MG 24 hr tablet Take 30 mg by mouth at bedtime. 09/15/20   [provider]  Multiple Vitamin (MULTIVITAMIN WITH MINERALS) TABS tablet Take 1 tablet by mouth daily.    [provider]  Omega-3 Fatty Acids (FISH OIL) 1200 MG CAPS Take 1,200 mg by mouth daily.    [provider]  OZEMPIC, 0.25 OR 0.5 MG/DOSE, 2 MG/1.5ML SOPN Inject 0.5 mg into the skin every Sunday. 08/28/20   [provider]  potassium chloride SA (KLOR-CON) 20 MEQ tablet Take 20 mEq by mouth every Monday, Wednesday, and Friday. 09/16/15   [provider]  silodosin (RAPAFLO) 8 MG CAPS capsule Take 8 mg by mouth daily with breakfast. 09/02/20   [provider]  sotalol (BETAPACE) 80 MG tablet Take 80-120 mg by mouth See admin instructions. Take 1 tablet (80mg ) by mouth in the morning and take 1 and 1/2 tablet (120mg ) by mouth in the evening. 09/04/20   [provider]  sucralfate (CARAFATE) 1 g tablet Take 1 tablet (1  g total) by mouth 4 (four) times daily. Dissolve each tablet in 15 cc water before use. 10/09/20   Kyung Rudd, MD  TRESIBA FLEXTOUCH 100 UNIT/ML FlexTouch Pen Inject 18-20 Units into the skin daily after breakfast. 08/19/20   [provider]  Turmeric Curcumin 500 MG CAPS Take 500 mg by mouth daily.    [provider]    Physical Exam: Vitals:   10/06/2020 1100 10/21/2020 1115 10/02/2020 1130 10/21/2020 1141  BP:    (!) 119/55  Pulse: 70 70 70 70  Resp:    16  Temp:      TempSrc:      SpO2: 100% 96% 99% 99%    General: 80 y.o. male resting in bed in NAD Eyes: PERRL, normal sclera ENMT: Nares patent w/o discharge, orophaynx clear, dentition normal, ears  w/o discharge/lesions/ulcers Neck: Supple, trachea midline Cardiovascular: RRR, +S1, S2, no g/r, 2/6 SEM, equal pulses throughout Respiratory: CTABL, no w/r/r, normal WOB GI: BS+, NDNT, no masses noted, no organomegaly noted MSK: No e/c/c, pain w/ flexion of right shoulder (no edema, erythema noted) Skin: sacral wound noted, left dorsal lateral hand cellulitis, BLE cellulitis noted Neuro: A&O x 3, decreased left handgrip, MSK strg is 3/5 BLE, sensation intact Psyc: Appropriate interaction and affect, calm/cooperative  Labs on Admission: I have personally reviewed following labs and imaging studies  CBC: Recent Labs  Lab 10/20/2020 1013  WBC 5.8  NEUTROABS 5.3  HGB 8.2*  HCT 24.8*  MCV 88.3  PLT 740*   Basic Metabolic Panel: Recent Labs  Lab 10/02/2020 1013  NA 134*  K 3.6  CL 100  CO2 27  GLUCOSE 121*  BUN 40*  CREATININE 1.38*  CALCIUM 9.4   GFR: CrCl cannot be calculated (Unknown ideal weight.). Liver Function Tests: Recent Labs  Lab 10/02/2020 1013  AST 32  ALT 69*  ALKPHOS 43  BILITOT 0.9  PROT 4.9*  ALBUMIN 1.7*   No results for input(s): LIPASE, AMYLASE in the last 168 hours. No results for input(s): AMMONIA in the last 168 hours. Coagulation Profile: Recent Labs  Lab 10/20/2020 1013  INR  2.2*   Cardiac Enzymes: No results for input(s): CKTOTAL, CKMB, CKMBINDEX, TROPONINI in the last 168 hours. BNP (last 3 results) No results for input(s): PROBNP in the last 8760 hours. HbA1C: No results for input(s): HGBA1C in the last 72 hours. CBG: No results for input(s): GLUCAP in the last 168 hours. Lipid Profile: No results for input(s): CHOL, HDL, LDLCALC, TRIG, CHOLHDL, LDLDIRECT in the last 72 hours. Thyroid Function Tests: No results for input(s): TSH, T4TOTAL, FREET4, T3FREE, THYROIDAB in the last 72 hours. Anemia Panel: Recent Labs    10/25/2020 1013  RETICCTPCT 1.4   Urine analysis:    Component Value Date/Time   COLORURINE YELLOW 10/28/2020 1102   APPEARANCEUR CLEAR 09/30/2020 1102   LABSPEC 1.012 10/24/2020 1102   PHURINE 5.0 10/18/2020 1102   GLUCOSEU NEGATIVE 10/18/2020 1102   HGBUR SMALL (A) 10/17/2020 1102   BILIRUBINUR NEGATIVE 10/29/2020 1102   KETONESUR NEGATIVE 10/06/2020 1102   PROTEINUR 30 (A) 10/23/2020 1102   NITRITE NEGATIVE 10/24/2020 1102   LEUKOCYTESUR SMALL (A) 10/25/2020 1102    Radiological Exams on Admission: DG Shoulder Right  Result Date: 10/13/2020 CLINICAL DATA:  Right shoulder pain. EXAM: RIGHT SHOULDER - 2+ VIEW COMPARISON:  None. FINDINGS: There is no evidence of fracture or dislocation. There is no evidence of arthropathy or other focal bone abnormality. Soft tissues are unremarkable. IMPRESSION: Negative. Electronically Signed   By: Marijo Conception M.D.   On: 10/27/2020 10:52   DG Chest Port 1 View  Result Date: 10/09/2020 CLINICAL DATA:  Sepsis. EXAM: PORTABLE CHEST 1 VIEW COMPARISON:  September 21, 2020. FINDINGS: Stable cardiomegaly. Status post coronary bypass graft. Left-sided pacemaker is unchanged in position. No pneumothorax or pleural effusion is noted. Lungs are clear. Bony thorax is unremarkable. IMPRESSION: No active disease. Electronically Signed   By: Marijo Conception M.D.   On: 10/21/2020 10:51    EKG: Independently  reviewed. V-paced  Assessment/Plan BLE weakness Hx of spinal hemangioma     - place in obs, tele     - will need MRI thoracic spine done [facility]; Neurosurg (Dr. Arnoldo Morale is aware and will follow the imaging)     - continue steroids (decadron 12mg   TID per his current taper)  Right shoulder pain     - imaging negative     - pain control     - check uric acid  Normocytic anemia Iron deficiency     - 3.5 pt drop since last check in 09/22/20     - FOBT positive, although he denies blood in stool     - [facility] consulted; hold eliquis until they see him     - protonix, q8h h&h  Left hand cellulitis BLE cellulitis Sacral wound     - continue vanc, cefepime     - wound care consult     - [place] on left hand does not reveal drainable abscess  CKD3a     - he is at baseline, watch nephrotoxins, follow  HFrEF s/p pacer placement CAD s/p CABG p Afib HTN     - hold eliquis until seen by GI     - continue sotalol, carvedilol, and digoxin for now     - hold lasix and vasotec today and reassess for the morning; he's getting gentle fluids today  DM2     - SSI, DM2 diet, A1c, glucose checks  HLD     - lipitor  BPH     - self I&O caths; continue     - continue proscar  GERD     - protonix, carafate  Hypoalbuminemia     - check prealbumin  DVT prophylaxis: SCDs  Code Status: DNI, confirmed at beside with sister  Family Communication: w/ sister at bedside  Consults called: EDP spoke with neurosurgery (Dr. Arnoldo Morale);    Status is: Observation  The patient remains OBS appropriate and will d/c before 2 midnights.  Dispo: [facility]: [facility]              Anticipated d/c is to: [facility]              Patient currently is not medically stable to d/c.   Difficult to place patient No  Time spent coordinating admission: 70 minutes  [facility]  If 7PM-7AM, please contact night-coverage [email or web address]  10/12/2020, 12:36 PM

## 2020-10-19 NOTE — Consult Note (Signed)
Consultation  Referring Provider: Dr. Jiles Prows    Primary Care Physician:  [name], MD Primary Gastroenterologist:    [facility]     Reason for Consultation:     Weakness, anemia, Hemoccult-positive stool         HPI:   Timothy Neal is a 80 y.o. 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 fib on Eliquis, heart failure and diabetes type 2 who presented to the hospital today from [facility] [place] with weakness.    Today, the patient explains that he has had increased weakness over the past week and felt no strength or ability to lift his legs and use his walker, which was minimal anyways according to his sister who is at his bedside.  He tells me that he does have some heartburn off and on and a feeling of dysphagia ever since he started radiation, he has had 2 weeks of full radiation and was due to restart again next week for his hemangioma.  His sister also explains that he has a history of GI bleeds, apparently about 15 years ago it was something in his upper stomach, she thinks related to his large hiatal hernia and "ulcers on it", another work-up was done in 2019 as below with no real source identified.    Patient's last dose of Eliquis was this morning.    Denies fever, chills, weight loss, change in bowel habits, abdominal pain, nausea or vomiting.     ED course: Hemoglobin 8.2 (11.7 on 2/22), BUN increased at 40 with a creatinine of 1.38, though this appears patient's baseline Hemoccult positive but stool is [place]  GI history: 03/29/2018 EGD for work-up of anemia, large hiatal hernia, no identifiable bleeding source identified 03/30/2018 colonoscopy with diverticulosis but no active bleeding 03/30/2018 capsule endoscopy incomplete due to prolonged esophageal and gastric transit time  Past Medical History:  Diagnosis Date  . Cardiomyopathy ([place]) 08/26/2014  . Chronic kidney disease, stage 3 unspecified  ([place]) 04/20/2016  . Chronic systolic heart failure ([place]) 09/23/2014  . Coronary artery disease 04/20/2016  . Diabetes mellitus, type II ([place]) 08/26/2014   Last Assessment & Plan:  Formatting of this note might be different from the original. Home regimen.  . Dyslipidemia 04/20/2016  . History of gout 04/20/2016  . Obstructive sleep apnea (adult) (pediatric) 04/20/2016   Formatting of this note might be different from the original. does not use CPAP  . Paroxysmal atrial fibrillation ([place]) 04/20/2016   Last Assessment & Plan:  Formatting of this note might be different from the original. Continue sotalol, carvedilol. Hold Apixaban until PCP follow up, if h/H remains stable apixaban to be resumed.  . 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 statin, beta-blocker, ACE inhibitor  . 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 MDT BiV ICD implanted 09/23/14  . Spinal cord compression ([place]) 09/19/2020    Past Surgical History:  Procedure Laterality Date  . CORONARY ARTERY BYPASS GRAFT    . HERNIA REPAIR      Family History  Problem Relation Age of Onset  . Heart attack Mother   . Diabetes Mother   . Heart attack Father   . Stroke Father   . Diabetes Brother      Social History  Tobacco Use  . Smoking status: Former Research scientist (life sciences)  . Smokeless tobacco: Never Used    Prior to Admission medications   Medication Sig Start Date End Date Taking? Authorizing Provider  atorvastatin (LIPITOR) 40 MG tablet Take 40 mg by mouth every Monday, Wednesday, and Friday. At bedtime   Yes [provider]  carvedilol (COREG) 12.5 MG tablet Take 12.5 mg by mouth 2 (two) times daily. 02/14/17  Yes [provider]  Cholecalciferol 25 MCG (1000 UT) capsule Take 1,000 Units by mouth daily.   Yes [provider]  Coenzyme  Q10 200 MG capsule Take 200 mg by mouth daily.   Yes [provider]  dexamethasone (DECADRON) 4 MG tablet Take 1 tablet (4 mg total) by mouth daily for 25 days. Take as prescribed according to instructions given for reducing the dose. Patient taking differently: Take 4 mg by mouth See admin instructions. Start on 10/14/2020 12mg  when you get home Start on 10/15/2020 12mg  three times daily until 10/30/2020 8mg  twice daily until 11/06/2020 2mg  twice daily until 11/13/2020 2mg  daily until 11/20/20 2mg  every other day and STOP on 11/25/20 10/14/20 11/08/20 Yes Hayden Pedro, PA-C  digoxin (LANOXIN) 0.125 MG tablet Take 0.125 mg by mouth daily. 09/15/20  Yes [provider]  ELIQUIS 2.5 MG TABS tablet Take 2.5 mg by mouth 2 (two) times daily. 08/25/20  Yes [provider]  enalapril (VASOTEC) 2.5 MG tablet Take 2.5 mg by mouth 2 (two) times daily.   Yes [provider]  ferrous sulfate 325 (65 FE) MG tablet Take 325 mg by mouth every Monday, Wednesday, and Friday.   Yes [provider]  finasteride (PROSCAR) 5 MG tablet Take 5 mg by mouth at bedtime. 09/15/20  Yes [provider]  furosemide (LASIX) 40 MG tablet Take 80 mg by mouth daily.   Yes [provider]  glipiZIDE (GLUCOTROL) 5 MG tablet Take 5 mg by mouth 2 (two) times daily. 09/04/20  Yes [provider]  isosorbide mononitrate (IMDUR) 30 MG 24 hr tablet Take 30 mg by mouth at bedtime. 09/15/20  Yes [provider]  Multiple Vitamin (MULTIVITAMIN WITH MINERALS) TABS tablet Take 1 tablet by mouth daily.   Yes [provider]  Omega-3 Fatty Acids (FISH OIL) 1200 MG CAPS Take 1,200 mg by mouth daily.   Yes [provider]  OZEMPIC, 0.25 OR 0.5 MG/DOSE, 2 MG/1.5ML SOPN Inject 0.5 mg into the skin every Sunday. 08/28/20  Yes [provider]  potassium chloride SA (KLOR-CON) 20 MEQ tablet Take 20 mEq by mouth every Monday, Wednesday, and  Friday. 09/16/15  Yes [provider]  silodosin (RAPAFLO) 8 MG CAPS capsule Take 8 mg by mouth daily with breakfast. 09/02/20  Yes [provider]  sotalol (BETAPACE) 80 MG tablet Take 80-120 mg by mouth See admin instructions. Take 1 tablet (80mg ) by mouth in the morning and take 1 and 1/2 tablet (120mg ) by mouth in the evening. 09/04/20  Yes [provider]  sucralfate (CARAFATE) 1 g tablet Take 1 tablet (1 g total) by mouth 4 (four) times daily. Dissolve each tablet in 15 cc water before use. Patient taking differently: Take 1 g by mouth daily. Dissolve each tablet in 15 cc water before use. 10/09/20  Yes Kyung Rudd, MD  TRESIBA FLEXTOUCH 100 UNIT/ML FlexTouch Pen Inject 18-23 Units into the skin daily after breakfast. 08/19/20  Yes [provider]  Turmeric Curcumin 500 MG CAPS Take 500 mg by mouth  daily.   Yes [provider]    Current Facility-Administered Medications  Medication Dose Route Frequency Provider Last Rate Last Admin  . lactated ringers infusion   Intravenous Continuous Long, Wonda Olds, MD 150 mL/hr at 10/15/2020 1034 New Bag at 10/08/2020 1034  . pantoprazole (PROTONIX) 80 mg in sodium chloride 0.9 % 100 mL IVPB  80 mg Intravenous Once Long, Wonda Olds, MD        Allergies as of 10/11/2020  . (No Known Allergies)     Review of Systems:    Constitutional: No weight loss, fever or chills Skin: No rash  Cardiovascular: No chest pain Respiratory: No SOB Gastrointestinal: See HPI and otherwise negative Genitourinary: No dysuria  Neurological: No headache, dizziness or syncope Musculoskeletal: No new muscle or joint pain Hematologic: +some bleeding and bruising Psychiatric: No history of depression or anxiety    Physical Exam:  Vital signs in last 24 hours: Temp:  [98.1 F (36.7 C)-100.3 F (37.9 C)] 98.1 F (36.7 C) (03/21 1329) Pulse Rate:  [68-70] 68 (03/21 1329) Resp:  [14-22] 16 (03/21 1329) BP: (115-122)/(53-62) 121/57  (03/21 1329) SpO2:  [96 %-100 %] 97 % (03/21 1329)   General:   Pleasant male appears to be in NAD, Well developed, Well nourished, alert and cooperative Head:  Normocephalic and atraumatic. Eyes:   PEERL, EOMI. No icterus. Conjunctiva pink. Ears:  Normal auditory acuity. Neck:  Supple Throat: Oral cavity and pharynx without inflammation, swelling or lesion.  Lungs: Respirations even and unlabored. Lungs clear to auscultation bilaterally.   No wheezes, crackles, or rhonchi.  Heart: Normal S1, S2. No MRG. Regular rate and rhythm.1+ b/l peripheral edema with weeping of the skin to level of the knees Abdomen:  Soft, nondistended, nontender. No rebound or guarding. Normal bowel sounds. No appreciable masses or hepatomegaly. Rectal:  Not performed.  Msk:  Symmetrical without gross deformities. Peripheral pulses intact.  Extremities:  Normal ROM, normal sensation. Neurologic:  Alert and  oriented x4;  grossly normal neurologically.  Skin:   Dry and intact without significant lesions or rashes. Psychiatric: Demonstrates good judgement and reason without abnormal affect or behaviors.   LAB RESULTS: Recent Labs    10/14/2020 1013  WBC 5.8  HGB 8.2*  HCT 24.8*  PLT 106*   BMET Recent Labs    10/07/2020 1013  NA 134*  K 3.6  CL 100  CO2 27  GLUCOSE 121*  BUN 40*  CREATININE 1.38*  CALCIUM 9.4   LFT Recent Labs    10/18/2020 1013  PROT 4.9*  ALBUMIN 1.7*  AST 32  ALT 69*  ALKPHOS 43  BILITOT 0.9   PT/INR Recent Labs    10/29/2020 1013  LABPROT 24.1*  INR 2.2*    STUDIES: DG Shoulder Right  Result Date: 10/28/2020 CLINICAL DATA:  Right shoulder pain. EXAM: RIGHT SHOULDER - 2+ VIEW COMPARISON:  None. FINDINGS: There is no evidence of fracture or dislocation. There is no evidence of arthropathy or other focal bone abnormality. Soft tissues are unremarkable. IMPRESSION: Negative. Electronically Signed   By: Marijo Conception M.D.   On: 10/29/2020 10:52   DG Chest Port 1  View  Result Date: 10/08/2020 CLINICAL DATA:  Sepsis. EXAM: PORTABLE CHEST 1 VIEW COMPARISON:  September 21, 2020. FINDINGS: Stable cardiomegaly. Status post coronary bypass graft. Left-sided pacemaker is unchanged in position. No pneumothorax or pleural effusion is noted. Lungs are clear. Bony thorax is unremarkable. IMPRESSION: No active disease. Electronically Signed   By: Jeneen Rinks  Murlean Caller M.D.   On: 10/10/2020 10:51    Impression / Plan:   Impression: 1.  Normocytic anemia: 3 g drop since 2/22, Hemoccult positive stools, history of anemia in the past with overt GI bleeding, last worked up in 2019 and thought to be diverticular, apparently history of being told he had what sounds like [place] erosions 15 years ago, last EGD did note large hiatal hernia; consider relation to GI bleed versus hemangioma versus other 2.  History of spinal hemangioma: Currently undergoing radiation 3.  CKD 3: Patient is at his baseline 4.  Bilateral lower extremity cellulitis 5.  A. fib: Last dose of Eliquis this morning 10/14/2020 6.  Sacral wound 7.  GERD: Some increase in symptoms with radiation recently  Plan: 1.  Agree with Protonix and Carafate 2.  Continue to monitor hemoglobin with transfusion as needed less than 7 3.  Patient will need to wait until at least 3/23 for any endoscopic procedures in order to allow time for his Eliquis to washout.  We will discuss necessity of EGD and colonoscopy with Dr. Havery Moros. 4.  Please await any further recommendations from Dr. Havery Moros later today.  Thank you for your kind consultation, we will continue to follow.  Lavone Nian Lemmon  10/16/2020, 1:50 PM

## 2020-10-20 ENCOUNTER — Other Ambulatory Visit: Payer: Self-pay

## 2020-10-20 ENCOUNTER — Observation Stay (HOSPITAL_COMMUNITY): Payer: Medicare PPO | Admitting: Anesthesiology

## 2020-10-20 ENCOUNTER — Encounter (HOSPITAL_COMMUNITY): Payer: Self-pay | Admitting: Internal Medicine

## 2020-10-20 ENCOUNTER — Ambulatory Visit
Admission: RE | Admit: 2020-10-20 | Discharge: 2020-10-20 | Disposition: A | Discharge status: Home / Self Care | Payer: Medicare PPO | Source: Ambulatory Visit | Attending: Radiation Oncology | Admitting: Radiation Oncology

## 2020-10-20 ENCOUNTER — Encounter (HOSPITAL_COMMUNITY): Admission: EM | Disposition: E | Payer: Self-pay | Source: Home / Self Care | Attending: Internal Medicine

## 2020-10-20 DIAGNOSIS — D649 Anemia, unspecified: Secondary | ICD-10-CM | POA: Diagnosis not present

## 2020-10-20 DIAGNOSIS — L89150 Pressure ulcer of sacral region, unstageable: Secondary | ICD-10-CM | POA: Diagnosis not present

## 2020-10-20 DIAGNOSIS — K259 Gastric ulcer, unspecified as acute or chronic, without hemorrhage or perforation: Secondary | ICD-10-CM

## 2020-10-20 DIAGNOSIS — K449 Diaphragmatic hernia without obstruction or gangrene: Secondary | ICD-10-CM

## 2020-10-20 DIAGNOSIS — K922 Gastrointestinal hemorrhage, unspecified: Secondary | ICD-10-CM | POA: Diagnosis not present

## 2020-10-20 DIAGNOSIS — K921 Melena: Secondary | ICD-10-CM

## 2020-10-20 DIAGNOSIS — B3781 Candidal esophagitis: Secondary | ICD-10-CM | POA: Diagnosis not present

## 2020-10-20 DIAGNOSIS — D1809 Hemangioma of other sites: Secondary | ICD-10-CM

## 2020-10-20 DIAGNOSIS — R195 Other fecal abnormalities: Secondary | ICD-10-CM | POA: Diagnosis not present

## 2020-10-20 DIAGNOSIS — R7881 Bacteremia: Secondary | ICD-10-CM

## 2020-10-20 DIAGNOSIS — B9562 Methicillin resistant Staphylococcus aureus infection as the cause of diseases classified elsewhere: Secondary | ICD-10-CM

## 2020-10-20 DIAGNOSIS — E440 Moderate protein-calorie malnutrition: Secondary | ICD-10-CM

## 2020-10-20 HISTORY — PX: ENTEROSCOPY: SHX5533

## 2020-10-20 HISTORY — PX: BIOPSY: SHX5522

## 2020-10-20 LAB — BLOOD CULTURE ID PANEL (REFLEXED) - BCID2

## 2020-10-20 LAB — CBC
HCT: 24.5 % — ABNORMAL LOW (ref 39.0–52.0)
HCT: 27.2 % — ABNORMAL LOW (ref 39.0–52.0)
HCT: 29 % — ABNORMAL LOW (ref 39.0–52.0)
Hemoglobin: 8.2 g/dL — ABNORMAL LOW (ref 13.0–17.0)
Hemoglobin: 8.8 g/dL — ABNORMAL LOW (ref 13.0–17.0)
Hemoglobin: 9.4 g/dL — ABNORMAL LOW (ref 13.0–17.0)
MCH: 29 pg (ref 26.0–34.0)
MCH: 29.5 pg (ref 26.0–34.0)
MCH: 29.5 pg (ref 26.0–34.0)
MCHC: 32.4 g/dL (ref 30.0–36.0)
MCHC: 32.4 g/dL (ref 30.0–36.0)
MCHC: 33.5 g/dL (ref 30.0–36.0)
MCV: 88.1 fL (ref 80.0–100.0)
MCV: 89.8 fL (ref 80.0–100.0)
MCV: 90.9 fL (ref 80.0–100.0)
Platelets: 109 10*3/uL — ABNORMAL LOW (ref 150–400)
Platelets: 124 10*3/uL — ABNORMAL LOW (ref 150–400)
Platelets: 125 10*3/uL — ABNORMAL LOW (ref 150–400)
RBC: 2.78 MIL/uL — ABNORMAL LOW (ref 4.22–5.81)
RBC: 3.03 MIL/uL — ABNORMAL LOW (ref 4.22–5.81)
RBC: 3.19 MIL/uL — ABNORMAL LOW (ref 4.22–5.81)
RDW: 18.2 % — ABNORMAL HIGH (ref 11.5–15.5)
RDW: 18.5 % — ABNORMAL HIGH (ref 11.5–15.5)
RDW: 18.6 % — ABNORMAL HIGH (ref 11.5–15.5)
WBC: 5.7 10*3/uL (ref 4.0–10.5)
WBC: 7 10*3/uL (ref 4.0–10.5)
WBC: 7.1 10*3/uL (ref 4.0–10.5)
nRBC: 0 % (ref 0.0–0.2)
nRBC: 0 % (ref 0.0–0.2)
nRBC: 0 % (ref 0.0–0.2)

## 2020-10-20 LAB — COMPREHENSIVE METABOLIC PANEL
ALT: 69 U/L — ABNORMAL HIGH (ref 0–44)
AST: 32 U/L (ref 15–41)
Albumin: 1.7 g/dL — ABNORMAL LOW (ref 3.5–5.0)
Alkaline Phosphatase: 44 U/L (ref 38–126)
Anion gap: 10 (ref 5–15)
BUN: 39 mg/dL — ABNORMAL HIGH (ref 8–23)
CO2: 23 mmol/L (ref 22–32)
Calcium: 9.2 mg/dL (ref 8.9–10.3)
Chloride: 99 mmol/L (ref 98–111)
Creatinine, Ser: 1.29 mg/dL — ABNORMAL HIGH (ref 0.61–1.24)
GFR, Estimated: 57 mL/min — ABNORMAL LOW (ref >60)
Glucose, Bld: 104 mg/dL — ABNORMAL HIGH (ref 70–99)
Potassium: 3.5 mmol/L (ref 3.5–5.1)
Sodium: 132 mmol/L — ABNORMAL LOW (ref 135–145)
Total Bilirubin: 1 mg/dL (ref 0.3–1.2)
Total Protein: 4.9 g/dL — ABNORMAL LOW (ref 6.5–8.1)

## 2020-10-20 LAB — GLUCOSE, CAPILLARY
Glucose-Capillary: 94 mg/dL (ref 70–99)
Glucose-Capillary: 91 mg/dL (ref 70–99)
Glucose-Capillary: 118 mg/dL — ABNORMAL HIGH (ref 70–99)
Glucose-Capillary: 272 mg/dL — ABNORMAL HIGH (ref 70–99)

## 2020-10-20 SURGERY — ENTEROSCOPY
Anesthesia: Monitor Anesthesia Care

## 2020-10-20 MED ORDER — FLUCONAZOLE 100 MG PO TABS
100.0000 mg | ORAL_TABLET | Freq: Every day | ORAL | Status: DC
  Administered 2020-10-21 – 2020-10-24 (×4): 100 mg via ORAL
  Filled 2020-10-20 (×4): qty 1

## 2020-10-20 MED ORDER — LIDOCAINE HCL (CARDIAC) PF 100 MG/5ML IV SOSY
PREFILLED_SYRINGE | INTRAVENOUS | Status: DC | PRN
  Administered 2020-10-20: 60 mg via INTRAVENOUS

## 2020-10-20 MED ORDER — FLUCONAZOLE 100 MG PO TABS
200.0000 mg | ORAL_TABLET | Freq: Once | ORAL | Status: AC
  Administered 2020-10-20: 200 mg via ORAL
  Filled 2020-10-20: qty 2

## 2020-10-20 MED ORDER — LACTATED RINGERS IV SOLN
INTRAVENOUS | Status: DC
  Administered 2020-10-20: 1000 mL via INTRAVENOUS

## 2020-10-20 MED ORDER — LIP MEDEX EX OINT
TOPICAL_OINTMENT | CUTANEOUS | Status: AC
  Filled 2020-10-20: qty 7

## 2020-10-20 MED ORDER — FERROUS SULFATE 325 (65 FE) MG PO TABS
325.0000 mg | ORAL_TABLET | Freq: Every day | ORAL | Status: DC
  Administered 2020-10-21 – 2020-10-22 (×2): 325 mg via ORAL
  Filled 2020-10-20 (×3): qty 1

## 2020-10-20 MED ORDER — PROPOFOL 10 MG/ML IV BOLUS
INTRAVENOUS | Status: DC | PRN
  Administered 2020-10-20: 20 mg via INTRAVENOUS
  Administered 2020-10-20: 10 mg via INTRAVENOUS
  Administered 2020-10-20: 30 mg via INTRAVENOUS

## 2020-10-20 MED ORDER — PROPOFOL 500 MG/50ML IV EMUL
INTRAVENOUS | Status: DC | PRN
  Administered 2020-10-20: 75 ug/kg/min via INTRAVENOUS

## 2020-10-20 MED ORDER — COLLAGENASE 250 UNIT/GM EX OINT
TOPICAL_OINTMENT | Freq: Every day | CUTANEOUS | Status: DC
  Filled 2020-10-20: qty 30

## 2020-10-20 MED ORDER — GERHARDT'S BUTT CREAM
TOPICAL_CREAM | Freq: Three times a day (TID) | CUTANEOUS | Status: DC
  Administered 2020-10-22: 1 via TOPICAL
  Filled 2020-10-20: qty 1

## 2020-10-20 MED ORDER — SODIUM CHLORIDE 0.9 % IV SOLN
650.0000 mg | Freq: Every day | INTRAVENOUS | Status: DC
  Administered 2020-10-20 – 2020-10-22 (×3): 650 mg via INTRAVENOUS
  Filled 2020-10-20 (×4): qty 13

## 2020-10-20 SURGICAL SUPPLY — 1 items: TOWEL COTTON PACK 4EA (MISCELLANEOUS) ×6 IMPLANT

## 2020-10-20 NOTE — H&P (View-Only) (Signed)
Patient ID: Timothy Neal, male   DOB: 20-Aug-1940, 80 y.o.   MRN: [id number]    Progress Note   Subjective   day # 2  CC ;weakness, anemia  off Eliquis x 24 hrs   HGB 8.2  Stable   Thoracic spine MRI - pending  No specific complaints except for voluminous diarrhea this morning with prep   Objective   Vital signs in last 24 hours: Temp:  [97.7 F (36.5 C)-100.3 F (37.9 C)] 98.2 F (36.8 C) (03/22 0656) Pulse Rate:  [68-81] 78 (03/22 0656) Resp:  [14-22] 17 (03/22 0656) BP: (110-136)/(53-63) 136/59 (03/22 0656) SpO2:  [90 %-100 %] 90 % (03/22 0656) Weight:  [72 kg] 72 kg (03/21 2100) Last BM Date: 10/16/20 General:    Elderly white male in NAD Heart:  Regular rate and rhythm; no murmurs Lungs: Respirations even and unlabored, lungs CTA bilaterally Abdomen:  Soft, nontender and nondistended. Normal bowel sounds. Extremities:  Without edema. Neurologic:  Alert right lower extremity weakness. Psych:  Cooperative. Normal mood and affect.  Intake/Output from previous day: 03/21 0701 - 03/22 0700 In: 686.7 [IV Piggyback:686.7] Out: 9233 [Urine:1350] Intake/Output this shift: No intake/output data recorded.  Lab Results: Recent Labs    10/01/2020 1013 10/03/2020 1537 10/25/2020 2352  WBC 5.8  --  5.7  HGB 8.2* 7.8* 8.2*  HCT 24.8* 23.9* 24.5*  PLT 106*  --  109*   BMET Recent Labs    10/06/2020 1013 09/29/2020 2352  NA 134* 132*  K 3.6 3.5  CL 100 99  CO2 27 23  GLUCOSE 121* 104*  BUN 40* 39*  CREATININE 1.38* 1.29*  CALCIUM 9.4 9.2   LFT Recent Labs    10/21/2020 2352  PROT 4.9*  ALBUMIN 1.7*  AST 32  ALT 69*  ALKPHOS 44  BILITOT 1.0   PT/INR Recent Labs    10/04/2020 1013  LABPROT 24.1*  INR 2.2*    Studies/Results: DG Shoulder Right  Result Date: 10/25/2020 CLINICAL DATA:  Right shoulder pain. EXAM: RIGHT SHOULDER - 2+ VIEW COMPARISON:  None. FINDINGS: There is no evidence of fracture or dislocation. There is no evidence of arthropathy or other  focal bone abnormality. Soft tissues are unremarkable. IMPRESSION: Negative. Electronically Signed   By: Marijo Conception M.D.   On: 10/07/2020 10:52   DG Chest Port 1 View  Result Date: 10/25/2020 CLINICAL DATA:  Sepsis. EXAM: PORTABLE CHEST 1 VIEW COMPARISON:  September 21, 2020. FINDINGS: Stable cardiomegaly. Status post coronary bypass graft. Left-sided pacemaker is unchanged in position. No pneumothorax or pleural effusion is noted. Lungs are clear. Bony thorax is unremarkable. IMPRESSION: No active disease. Electronically Signed   By: Marijo Conception M.D.   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 Prior EGD and colonoscopy 2019 pertinent for large hiatal hernia.  Capsule endoscopy done at that time incomplete with retention in the stomach.  #3 coronary artery disease status post CABG 4.  Atrial fibrillation-on Eliquis-currently on hold  Hemoglobin is stable. Plan is for EGD and capsule endoscopy this afternoon Thoracic MRI has been ordered to reassess the hemangioma for stability. Have reached out to Dr. Arnoldo Morale office to discuss if felt appropriate to proceed with EGD and capsule endoscopy with sedation prior to  the MRI, versus holding on endoscopic evaluation until post MRI review.         Active Problems:   GIB (gastrointestinal bleeding)   Symptomatic anemia   Heme positive stool     LOS: 0 days   Niclas Markell PA-C 10/25/2020, 8:24 AM

## 2020-10-20 NOTE — Progress Notes (Signed)
Pharmacy Brief Note  80 y/o M ordered fluconazole for esophageal candidiasis. Patient has h/o CHF s/p AICD as well as atrial fibrillation and is concurrently on sotalol.   Lab Results  Component Value Date   CREATININE 1.29 (H) 10/12/2020   CREATININE 1.38 (H) 10/22/2020   CREATININE 1.43 (H) 09/22/2020   Estimated Creatinine Clearance: 44.1 mL/min (A) (by C-G formula based on SCr of 1.29 mg/dL (H)).   A/P: 1. Will initiate fluconazole 200 mg daily followed by 100 mg daily x 14 days.  2. Will monitor renal function and adjust dose accordingly.  3. Will monitor electrolytes due to potential for additive QT prolongation. Mag added to AM labs.  4. Would consider holding atorvastatin due to increased risk of rhabdomyolysis with concurrent fluconazole though monitoring for muscle pain/weakness may be sufficient.   Thank you for allowing pharmacy to participate in this patient's care.    Ulice Dash D  10/17/2020 3:13 PM

## 2020-10-20 NOTE — Transfer of Care (Signed)
Immediate Anesthesia Transfer of Care Note  Patient: Timothy Neal  Procedure(s) Performed: ENTEROSCOPY (N/A ) BIOPSY  Patient Location: PACU  Anesthesia Type:MAC  Level of Consciousness: drowsy  Airway & Oxygen Therapy: Patient Spontanous Breathing and Patient connected to face mask oxygen  Post-op Assessment: Report given to RN and Post -op Vital signs reviewed and stable  Post vital signs: Reviewed and stable  Last Vitals:  Vitals Value Taken Time  BP    Temp    Pulse 83 10/18/2020 1324  Resp 20 10/02/2020 1324  SpO2 100 % 10/21/2020 1324  Vitals shown include unvalidated device data.  Last Pain:  Vitals:   09/29/2020 1205  TempSrc: Oral  PainSc: 0-No pain         Complications: No complications documented.

## 2020-10-20 NOTE — Op Note (Signed)
[facility] Patient Name: Timothy Neal Procedure Date: 10/06/2020 MRN: [id number] Attending MD: Carlota Raspberry. Havery Moros , MD Date of Birth: March 13, 1941 CSN: [id number] Age: 80 Admit Type: Outpatient Procedure:                Small bowel enteroscopy Indications:              Anemia, heme positive dark stools, on Eliquis, s/p                            negative endoscopic evaluation in 2019 (EGD /                            colon, capsule retained in stomach) Providers:                Carlota Raspberry. Havery Moros, MD, Particia Nearing, RN, Ladona Ridgel, Technician Referring MD:              Medicines:                Monitored Anesthesia Care Complications:            No immediate complications. Estimated blood loss:                            Minimal. Estimated Blood Loss:     Estimated blood loss was minimal. Procedure:                Pre-Anesthesia Assessment:                           - Prior to the procedure, a History and Physical                            was performed, and patient medications and                            allergies were reviewed. The patient's tolerance of                            previous anesthesia was also reviewed. The risks                            and benefits of the procedure and the sedation                            options and risks were discussed with the patient.                            All questions were answered, and informed consent                            was obtained. Prior Anticoagulants: The patient has  taken Eliquis (apixaban), last dose was 1 day prior                            to procedure. ASA Grade Assessment: III - A patient                            with severe systemic disease. After reviewing the                            risks and benefits, the patient was deemed in                            satisfactory condition to undergo the procedure.                            After obtaining informed consent, the endoscope was                            passed under direct vision. Throughout the                            procedure, the patient's blood pressure, pulse, and                            oxygen saturations were monitored continuously. The                            [id number]-[id number] ([id number]) Olympus pediatric colonscope                            was introduced through the mouth and advanced to                            the proximal jejunum. The small bowel enteroscopy                            was accomplished without difficulty. The patient                            tolerated the procedure well. Scope In: Scope Out: Findings:      Esophagogastric landmarks were identified: the Z-line was found at 34       cm, the gastroesophageal junction was found at 34 cm and the upper       extent of the gastric folds was found at 44 cm from the incisors.      A 10 cm hiatal hernia was present. There were some cameron erosions and       2 superficial cameron ulcerations in the hernia sac, friable to water       jet lavage but no high risk stigmata for bleeding      Patchy, white plaques were found in the upper third of the esophagus and       in the middle third of the esophagus grossly consistent with esophageal       candidiasis.  The examined esophagus was tortuous with an angulated turn entering the       hernia sac.      The exam of the esophagus was otherwise normal.      Approximately 8 additional non-bleeding cratered, linear and superficial       gastric ulcers with no stigmata of bleeding were found in the gastric       fundus and in the gastric body. The largest lesion was 6 mm in largest       dimension. All clean based without high risk stigmata for bleeding. Most       3-10mm in size, other than one long linear ulceration.      The exam of the stomach was otherwise normal.      Biopsies were taken with a cold forceps in the gastric body, at  the       incisura and in the gastric antrum for Helicobacter pylori testing.      There was no evidence of significant pathology in the entire examined       duodenum.      There was no evidence of significant pathology in the proximal jejunum. Impression:               - Esophagogastric landmarks identified.                           - 10 cm hiatal hernia with cameron lesions                           - Esophageal plaques were found, grossly consistent                            with esophageal candidiasis.                           - Tortuous esophagus.                           - 10 non-bleeding gastric ulcers with no stigmata                            of bleeding - 2 cameron lesions within hernia sac,                            8 in gastric body / fundus.                           - Normal examined duodenum.                           - The examined portion of the jejunum was normal.                           - Biopsies were taken with a cold forceps for                            Helicobacter pylori testing.  Gastric ulcers with cameron lesions likely cause of                            anemia in the setting of Eliquis. No high risk                            stigmata for bleeding noted otherwise. Capsule                            endoscopy not performed given obvious pathology on                            this exam to account for symptoms. Recommendation:           - Return patient to hospital ward for ongoing care.                           - Full liquid diet, advance as tolerated.                           - Await pathology results, rule out H pylori.                           - Continue protonix 40mg  twice daily                           - no NSAIDs                           - continue to hold Eliquis for now (last dose was                            yesterday), consider resuming in next few days                            pending course                            - trend Hgb                           - start fluconazole for esophageal candidiasis                           - GI service will continue to follow up Procedure Code(s):        --- Professional ---                           [id number], Small intestinal endoscopy, enteroscopy                            beyond second portion of duodenum, not including                            ileum; with biopsy, single or multiple Diagnosis Code(s):        ---  Professional ---                           K44.9, Diaphragmatic hernia without obstruction or                            gangrene                           K22.9, Disease of esophagus, unspecified                           Q39.9, Congenital malformation of esophagus,                            unspecified                           K25.9, Gastric ulcer, unspecified as acute or                            chronic, without hemorrhage or perforation                           K92.1, Melena (includes Hematochezia) CPT copyright 2019 [employer]. All rights reserved. The codes documented in this report are preliminary and upon coder review may  be revised to meet current compliance requirements. Remo Lipps P. Reita Shindler, MD 10/06/2020 1:31:05 PM This report has been signed electronically. Number of Addenda: 0

## 2020-10-20 NOTE — Interval H&P Note (Signed)
History and Physical Interval Note:  10/07/2020 12:31 PM  Timothy Neal  has presented today for surgery, with the diagnosis of anemia, heme positive stool, Eliquis use.  The various methods of treatment have been discussed with the patient and family. After consideration of risks, benefits and other options for treatment, the patient has consented to  Procedure(s) with comments: ENTEROSCOPY (N/A) GIVENS CAPSULE STUDY (N/A) - to be placed endoscopically as a surgical intervention.  The patient's history has been reviewed, patient examined, no change in status, stable for surgery.  I have reviewed the patient's chart and labs.  Questions were answered to the patient's satisfaction.     [place]

## 2020-10-20 NOTE — Progress Notes (Signed)
Patient ID: Timothy Neal, male   DOB: [birth year], 80 y.o.   MRN: [id number]    Progress Note   Subjective   day # 2  CC ;weakness, anemia  off Eliquis x 24 hrs   HGB 8.2  Stable   Thoracic spine MRI - pending  No specific complaints except for voluminous diarrhea this morning with prep   Objective   Vital signs in last 24 hours: Temp:  [97.7 F (36.5 C)-100.3 F (37.9 C)] 98.2 F (36.8 C) (03/22 0656) Pulse Rate:  [68-81] 78 (03/22 0656) Resp:  [14-22] 17 (03/22 0656) BP: (110-136)/(53-63) 136/59 (03/22 0656) SpO2:  [90 %-100 %] 90 % (03/22 0656) Weight:  [72 kg] 72 kg (03/21 2100) Last BM Date: 10/16/20 General:    Elderly white male in NAD Heart:  Regular rate and rhythm; no murmurs Lungs: Respirations even and unlabored, lungs CTA bilaterally Abdomen:  Soft, nontender and nondistended. Normal bowel sounds. Extremities:  Without edema. Neurologic:  Alert right lower extremity weakness. Psych:  Cooperative. Normal mood and affect.  Intake/Output from previous day: 03/21 0701 - 03/22 0700 In: 686.7 [IV Piggyback:686.7] Out: 9357 [Urine:1350] Intake/Output this shift: No intake/output data recorded.  Lab Results: Recent Labs    10/27/2020 1013 10/09/2020 1537 10/11/2020 2352  WBC 5.8  --  5.7  HGB 8.2* 7.8* 8.2*  HCT 24.8* 23.9* 24.5*  PLT 106*  --  109*   BMET Recent Labs    10/12/2020 1013 10/13/2020 2352  NA 134* 132*  K 3.6 3.5  CL 100 99  CO2 27 23  GLUCOSE 121* 104*  BUN 40* 39*  CREATININE 1.38* 1.29*  CALCIUM 9.4 9.2   LFT Recent Labs    10/06/2020 2352  PROT 4.9*  ALBUMIN 1.7*  AST 32  ALT 69*  ALKPHOS 44  BILITOT 1.0   PT/INR Recent Labs    10/27/2020 1013  LABPROT 24.1*  INR 2.2*    Studies/Results: DG Shoulder Right  Result Date: 10/23/2020 CLINICAL DATA:  Right shoulder pain. EXAM: RIGHT SHOULDER - 2+ VIEW COMPARISON:  None. FINDINGS: There is no evidence of fracture or dislocation. There is no evidence of arthropathy or other  focal bone abnormality. Soft tissues are unremarkable. IMPRESSION: Negative. Electronically Signed   By: Marijo Conception M.D.   On: 10/21/2020 10:52   DG Chest Port 1 View  Result Date: 10/09/2020 CLINICAL DATA:  Sepsis. EXAM: PORTABLE CHEST 1 VIEW COMPARISON:  September 21, 2020. FINDINGS: Stable cardiomegaly. Status post coronary bypass graft. Left-sided pacemaker is unchanged in position. No pneumothorax or pleural effusion is noted. Lungs are clear. Bony thorax is unremarkable. IMPRESSION: No active disease. Electronically Signed   By: Marijo Conception M.D.   On: 10/23/2020 10:51       Assessment / Plan:    #29 80 year old white male with thoracic hemangioma undergoing radiation therapy.  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 Prior EGD and colonoscopy 2019 pertinent for large hiatal hernia.  Capsule endoscopy done at that time incomplete with retention in the stomach.  #3 coronary artery disease status post CABG 4.  Atrial fibrillation-on Eliquis-currently on hold  Hemoglobin is stable. Plan is for EGD and capsule endoscopy this afternoon Thoracic MRI has been ordered to reassess the hemangioma for stability. Have reached out to Dr. Arnoldo Morale office to discuss if felt appropriate to proceed with EGD and capsule endoscopy with sedation prior to  the MRI, versus holding on endoscopic evaluation until post MRI review.         Active Problems:   GIB (gastrointestinal bleeding)   Symptomatic anemia   Heme positive stool     LOS: 0 days   Zahlia Deshazer PA-C 10/17/2020, 8:24 AM

## 2020-10-20 NOTE — Consult Note (Signed)
[place] [facility] for Infectious Disease  Total days of antibiotics 2               Reason for Consult: MRSA bacteremia    Referring Physician: [email or web address]  Active Problems:   GIB (gastrointestinal bleeding)   Symptomatic anemia   Heme positive stool   Multiple gastric ulcers   Hiatal hernia    HPI: Timothy Neal is a 80 y.o. 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 He has been having increasing weakness and sitting more in wheelchair per his sister's report. He states that he thought he had pain to his buttock that he attributed to hemorrhoids. On day of admit, he had worsening weakness where he sought attention in the ED. He was found to have acute decrease in hgb (11 to 8.2), in addition his sacral wound appeared cellulitic, in addition to erythema/fluctuance to right hand. His infectious work up revealed that he had MRSA bacteremia. egd found small ulcerative lesions, and eso candidiasis. He was initially started on vanco/cefepime.  Past Medical History:  Diagnosis Date  . Cardiomyopathy ([place]) 08/26/2014  . Chronic kidney disease, stage 3 unspecified ([place]) 04/20/2016  . Chronic systolic heart failure ([place]) 09/23/2014  . Coronary artery disease 04/20/2016  . Diabetes mellitus, type II ([place]) 08/26/2014   Last Assessment & Plan:  Formatting of this note might be different from the original. Home regimen.  . Dyslipidemia 04/20/2016  . History of gout 04/20/2016  . Obstructive sleep apnea (adult) (pediatric) 04/20/2016   Formatting of this note might be different from the original. does not use CPAP  . Paroxysmal atrial fibrillation ([place]) 04/20/2016   Last Assessment & Plan:  Formatting of this note might be different from the original. Continue sotalol, carvedilol. Hold Apixaban until PCP follow up, if h/H remains stable apixaban to be resumed.  . 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 statin, beta-blocker, ACE inhibitor  . 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 MDT BiV ICD implanted 09/23/14  . Spinal cord compression ([place]) 09/19/2020    Allergies: No Known Allergies  Current antibiotics:   MEDICATIONS: . atorvastatin  40 mg Oral Once per day on Mon Wed Fri  . carvedilol  12.5 mg Oral BID  . collagenase   Topical Daily  . dexamethasone  12 mg Oral Q8H  . digoxin  0.125 mg Oral Daily  . [START ON 10/21/2020] ferrous sulfate  325 mg Oral Q breakfast  . finasteride  5 mg Oral QHS  . [START ON 10/21/2020] fluconazole  100 mg Oral Daily  . Gerhardt's butt cream   Topical TID  . insulin aspart  0-15 Units Subcutaneous TID WC  . insulin aspart  0-5 Units Subcutaneous QHS  . pantoprazole (PROTONIX) IV  40 mg Intravenous Q12H  . sotalol  80 mg Oral Daily   And  . sotalol  120 mg Oral QHS  . tamsulosin  0.4 mg Oral Daily    Social History   Tobacco Use  . Smoking status: Former Research scientist (life sciences)  . Smokeless tobacco: Never Used    Family History  Problem Relation Age of Onset  . Heart attack Mother   . Diabetes Mother   .  Heart attack Father   . Stroke Father   . Diabetes Brother     Review of Systems - 12 point ros is negative except what is mentioned in hpi OBJECTIVE: Temp:  [97.7 F (36.5 C)-98.2 F (36.8 C)] 97.7 F (36.5 C) (03/22 1427) Pulse Rate:  [69-89] 89 (03/22 1427) Resp:  [12-23] 16 (03/22 1427) BP: (118-155)/(52-90) 151/86 (03/22 1427) SpO2:  [90 %-100 %] 100 % (03/22 1427) Weight:  [72 kg] 72 kg (03/21 2100) Physical Exam  Constitutional: He is oriented to person, place, and time. He appears well-developed and well-nourished. No distress.  HENT:  Mouth/Throat: Oropharynx is clear and moist. No oropharyngeal exudate.  Cardiovascular:  Normal rate, regular rhythm and normal heart sounds. Exam reveals no gallop and no friction rub.  No murmur heard.  Pulmonary/Chest: Effort normal and breath sounds normal. No respiratory distress. He has no wheezes.  Abdominal: Soft. Bowel sounds are normal. He exhibits no distension. There is no tenderness.  Lymphadenopathy:  He has no cervical adenopathy.  Ext: ?fluid filled blister to left hand, but tender to palpation/ scattered excoriation to legs, serous drainage on mat Buttock = scattered pressure ulcer. Eschar to right ischial tuberosity. Surround erythema. Scrotal edeam Neurological: He is alert and oriented to person, place, and time. Weakness to legs bilaterally Skin: Skin is warm and dry. No rash noted. No erythema.  Psychiatric: He has a normal mood and affect. His behavior is normal.     LABS: Results for orders placed or performed during the hospital encounter of 10/24/2020 (from the past 48 hour(s))  Blood Culture (routine x 2)     Status: None (Preliminary result)   Collection Time: 10/18/2020 10:00 AM   Specimen: BLOOD LEFT HAND  Result Value Ref Range   Specimen Description      BLOOD LEFT HAND Performed at [facility] [address], [place], [place] [address]    Special Requests      BOTTLES DRAWN AEROBIC AND ANAEROBIC Blood Culture results may not be optimal due to an inadequate volume of blood received in culture bottles Performed at [facility], [place] [address] [place], [place] [address]    Culture  Setup Time      IN BOTH AEROBIC AND ANAEROBIC BOTTLES GRAM POSITIVE COCCI CRITICAL VALUE NOTED.  VALUE IS CONSISTENT WITH PREVIOUSLY REPORTED AND CALLED VALUE.    Culture      NO GROWTH 1 DAY Performed at [facility], [place] [address] [place], [place] [address]    Report Status PENDING   Resp Panel by RT-PCR (Flu A&B, Covid) Nasopharyngeal Swab     Status: None   Collection Time: 10/12/2020 10:13 AM   Specimen:  Nasopharyngeal Swab; Nasopharyngeal(NP) swabs in vial transport medium  Result Value Ref Range   SARS Coronavirus 2 by RT PCR NEGATIVE NEGATIVE    Comment: (NOTE) SARS-CoV-2 target nucleic acids are NOT DETECTED.  The SARS-CoV-2 RNA is generally detectable in upper respiratory specimens during the acute phase of infection. The lowest concentration of SARS-CoV-2 viral copies this assay can detect is 138 copies/mL. A negative result does not preclude SARS-Cov-2 infection and should not be used as the sole basis for treatment or other patient management decisions. A negative result may occur with  improper specimen collection/handling, submission of specimen other than nasopharyngeal swab, presence of viral mutation(s) within the areas targeted by this assay, and inadequate number of viral copies(<138 copies/mL). A negative result must be combined with clinical  observations, patient history, and epidemiological information. The expected result is Negative.  Fact Sheet for Patients:  EntrepreneurPulse.com.au  Fact Sheet for Healthcare Providers:  IncredibleEmployment.be  This test is no t yet approved or cleared by the [place] FDA and  has been authorized for detection and/or diagnosis of SARS-CoV-2 by FDA under an Emergency Use Authorization (EUA). This EUA will remain  in effect (meaning this test can be used) for the duration of the COVID-19 declaration under Section 564(b)(1) of the Act, 21 U.S.C.section 360bbb-3(b)(1), unless the authorization is terminated  or revoked sooner.       Influenza A by PCR NEGATIVE NEGATIVE   Influenza B by PCR NEGATIVE NEGATIVE    Comment: (NOTE) The Xpert Xpress SARS-CoV-2/FLU/RSV plus assay is intended as an aid in the diagnosis of influenza from Nasopharyngeal swab specimens and should not be used as a sole basis for treatment. Nasal washings and aspirates are unacceptable for Xpert Xpress  SARS-CoV-2/FLU/RSV testing.  Fact Sheet for Patients: EntrepreneurPulse.com.au  Fact Sheet for Healthcare Providers: IncredibleEmployment.be  This test is not yet approved or cleared by the [place] FDA and has been authorized for detection and/or diagnosis of SARS-CoV-2 by FDA under an Emergency Use Authorization (EUA). This EUA will remain in effect (meaning this test can be used) for the duration of the COVID-19 declaration under Section 564(b)(1) of the Act, 21 U.S.C. section 360bbb-3(b)(1), unless the authorization is terminated or revoked.  Performed at [facility], [place] [address], [place], [place] [address]   Lactic acid, plasma     Status: None   Collection Time: 10/25/2020 10:13 AM  Result Value Ref Range   Lactic Acid, Venous 1.0 0.5 - 1.9 mmol/L    Comment: Performed at [facility], [place] [address], [place], [place] [address]  Comprehensive metabolic panel     Status: Abnormal   Collection Time: 10/12/2020 10:13 AM  Result Value Ref Range   Sodium 134 (L) 135 - 145 mmol/L   Potassium 3.6 3.5 - 5.1 mmol/L   Chloride 100 98 - 111 mmol/L   CO2 27 22 - 32 mmol/L   Glucose, Bld 121 (H) 70 - 99 mg/dL    Comment: Glucose reference range applies only to samples taken after fasting for at least 8 hours.   BUN 40 (H) 8 - 23 mg/dL   Creatinine, Ser 1.38 (H) 0.61 - 1.24 mg/dL   Calcium 9.4 8.9 - 10.3 mg/dL   Total Protein 4.9 (L) 6.5 - 8.1 g/dL   Albumin 1.7 (L) 3.5 - 5.0 g/dL   AST 32 15 - 41 U/L   ALT 69 (H) 0 - 44 U/L   Alkaline Phosphatase 43 38 - 126 U/L   Total Bilirubin 0.9 0.3 - 1.2 mg/dL   GFR, Estimated 52 (L) >60 mL/min    Comment: (NOTE) Calculated using the CKD-EPI Creatinine Equation (2021)    Anion gap 7 5 - 15    Comment: Performed at [facility], [place] [address] [place], [place] [address]  CBC WITH DIFFERENTIAL     Status: Abnormal   Collection Time:  10/15/2020 10:13 AM  Result Value Ref Range   WBC 5.8 4.0 - 10.5 K/uL   RBC 2.81 (L) 4.22 - 5.81 MIL/uL   Hemoglobin 8.2 (L) 13.0 - 17.0 g/dL   HCT 24.8 (L) 39.0 - 52.0 %   MCV 88.3 80.0 - 100.0 fL   MCH 29.2 26.0 - 34.0 pg   MCHC 33.1  30.0 - 36.0 g/dL   RDW 18.1 (H) 11.5 - 15.5 %   Platelets 106 (L) 150 - 400 K/uL    Comment: SPECIMEN CHECKED FOR CLOTS Immature Platelet Fraction may be clinically indicated, consider ordering this additional test [id number] REPEATED TO VERIFY PLATELET COUNT CONFIRMED BY SMEAR    nRBC 0.0 0.0 - 0.2 %   Neutrophils Relative % 92 %   Neutro Abs 5.3 1.7 - 7.7 K/uL   Lymphocytes Relative 4 %   Lymphs Abs 0.2 (L) 0.7 - 4.0 K/uL   Monocytes Relative 3 %   Monocytes Absolute 0.2 0.1 - 1.0 K/uL   Eosinophils Relative 0 %   Eosinophils Absolute 0.0 0.0 - 0.5 K/uL   Basophils Relative 0 %   Basophils Absolute 0.0 0.0 - 0.1 K/uL   Immature Granulocytes 1 %   Abs Immature Granulocytes 0.06 0.00 - 0.07 K/uL    Comment: Performed at [facility], [place] [address] [place], [place] [address]  Protime-INR     Status: Abnormal   Collection Time: 10/05/2020 10:13 AM  Result Value Ref Range   Prothrombin Time 24.1 (H) 11.4 - 15.2 seconds   INR 2.2 (H) 0.8 - 1.2    Comment: (NOTE) INR goal varies based on device and disease states. Performed at [facility], [place] [address], [place], [place] [address]   APTT     Status: Abnormal   Collection Time: 10/02/2020 10:13 AM  Result Value Ref Range   aPTT 42 (H) 24 - 36 seconds    Comment:        IF BASELINE aPTT IS ELEVATED, SUGGEST PATIENT RISK ASSESSMENT BE USED TO DETERMINE APPROPRIATE ANTICOAGULANT THERAPY. Performed at [facility], [place] [address] [place], [place] [address]   Vitamin B12     Status: Abnormal   Collection Time: 10/25/2020 10:13 AM  Result Value Ref Range   Vitamin B-12 1,314 (H) 180 - 914 pg/mL    Comment: (NOTE) This assay is not  validated for testing neonatal or myeloproliferative syndrome specimens for Vitamin B12 levels. Performed at [facility], [place] [address] [place] [address], [place], [place] [address]   Iron and TIBC     Status: Abnormal   Collection Time: 10/18/2020 10:13 AM  Result Value Ref Range   Iron 18 (L) 45 - 182 ug/dL   TIBC 124 (L) 250 - 450 ug/dL   Saturation Ratios 15 (L) 17.9 - 39.5 %   UIBC 106 ug/dL    Comment: Performed at [facility], [place] [address], [place], [place] [address]  Ferritin     Status: Abnormal   Collection Time: 10/17/2020 10:13 AM  Result Value Ref Range   Ferritin 809 (H) 24 - 336 ng/mL    Comment: Performed at [facility], [place] [address] [place], [place] [address]  Reticulocytes     Status: Abnormal   Collection Time: 09/30/2020 10:13 AM  Result Value Ref Range   Retic Ct Pct 1.4 0.4 - 3.1 %   RBC. 2.78 (L) 4.22 - 5.81 MIL/uL   Retic Count, Absolute 38.1 19.0 - 186.0 K/uL   Immature Retic Fract 25.3 (H) 2.3 - 15.9 %    Comment: Performed at [facility], [place] [address], [place],  [address]  Folate     Status: None   Collection Time: 09/30/2020 10:13 AM  Result Value Ref Range   Folate 18.6 >5.9 ng/mL    Comment: Performed at  [facility], [place] [address] [place], [place] [address]  Uric acid     Status: None   Collection Time: 10/07/2020 10:13 AM  Result Value Ref Range   Uric Acid, Serum 6.6 3.7 - 8.6 mg/dL    Comment: Performed at [facility], [place] [address] [place], [place] [address]  Prealbumin     Status: Abnormal   Collection Time: 10/08/2020 10:13 AM  Result Value Ref Range   Prealbumin 8.7 (L) 18 - 38 mg/dL    Comment: Performed at [facility], [place] [address] [place], [place] [address]  Blood Culture (routine x 2)     Status: None (Preliminary result)   Collection Time: 09/29/2020 10:15 AM   Specimen: BLOOD  Result Value Ref Range    Specimen Description      BLOOD LEFT ANTECUBITAL Performed at [facility], [place] [address] [place] [address], [place], [place] [address]    Special Requests      BOTTLES DRAWN AEROBIC AND ANAEROBIC Blood Culture results may not be optimal due to an inadequate volume of blood received in culture bottles Performed at [facility], [place] [address], [place], [place] [address]    Culture  Setup Time      IN BOTH AEROBIC AND ANAEROBIC BOTTLES GRAM POSITIVE COCCI Organism ID to follow CRITICAL RESULT CALLED TO, READ BACK BY AND VERIFIED WITH: E JACKSON PHARMD 10/15/2020 0245 JDW    Culture      CULTURE REINCUBATED FOR BETTER GROWTH Performed at [facility], [place] [address] [place],  [address]    Report Status PENDING   Blood Culture ID Panel (Reflexed)     Status: Abnormal   Collection Time: 10/24/2020 10:15 AM  Result Value Ref Range   Enterococcus faecalis NOT DETECTED NOT DETECTED   Enterococcus Faecium NOT DETECTED NOT DETECTED   Listeria monocytogenes NOT DETECTED NOT DETECTED   Staphylococcus species DETECTED (A) NOT DETECTED    Comment: CRITICAL RESULT CALLED TO, READ BACK BY AND VERIFIED WITH: E JACKSON PHARMD 10/04/2020 0245 JDW    Staphylococcus aureus (BCID) DETECTED (A) NOT DETECTED    Comment: Methicillin (oxacillin)-resistant Staphylococcus aureus (MRSA). MRSA is predictably resistant to beta-lactam antibiotics (except ceftaroline). Preferred therapy is vancomycin unless clinically contraindicated. Patient requires contact precautions if  hospitalized. CRITICAL RESULT CALLED TO, READ BACK BY AND VERIFIED WITH: E JACKSON PHARMD 10/19/2020 0245 JDW    Staphylococcus epidermidis NOT DETECTED NOT DETECTED   Staphylococcus lugdunensis NOT DETECTED NOT DETECTED   Streptococcus species NOT DETECTED NOT DETECTED   Streptococcus agalactiae NOT DETECTED NOT DETECTED   Streptococcus pneumoniae NOT DETECTED NOT DETECTED   Streptococcus pyogenes NOT DETECTED NOT  DETECTED   A.calcoaceticus-baumannii NOT DETECTED NOT DETECTED   Bacteroides fragilis NOT DETECTED NOT DETECTED   Enterobacterales NOT DETECTED NOT DETECTED   Enterobacter cloacae complex NOT DETECTED NOT DETECTED   Escherichia coli NOT DETECTED NOT DETECTED   Klebsiella aerogenes NOT DETECTED NOT DETECTED   Klebsiella oxytoca NOT DETECTED NOT DETECTED   Klebsiella pneumoniae NOT DETECTED NOT DETECTED   Proteus species NOT DETECTED NOT DETECTED   Salmonella species NOT DETECTED NOT DETECTED   Serratia marcescens NOT DETECTED NOT DETECTED   Haemophilus influenzae NOT DETECTED NOT DETECTED   Neisseria meningitidis NOT DETECTED NOT DETECTED   Pseudomonas aeruginosa NOT DETECTED NOT DETECTED   Stenotrophomonas maltophilia NOT DETECTED NOT DETECTED   Candida albicans NOT DETECTED NOT DETECTED   Candida auris NOT DETECTED NOT  DETECTED   Candida glabrata NOT DETECTED NOT DETECTED   Candida krusei NOT DETECTED NOT DETECTED   Candida parapsilosis NOT DETECTED NOT DETECTED   Candida tropicalis NOT DETECTED NOT DETECTED   Cryptococcus neoformans/gattii NOT DETECTED NOT DETECTED   Meth resistant mecA/C and MREJ DETECTED (A) NOT DETECTED    Comment: CRITICAL RESULT CALLED TO, READ BACK BY AND VERIFIED WITHSeleta Rhymes [facility] 10/02/2020 0245 JDW Performed at [place] [address] [place], [place] [address]   Urinalysis, Routine w reflex microscopic     Status: Abnormal   Collection Time: 10/18/2020 11:02 AM  Result Value Ref Range   Color, Urine YELLOW YELLOW   APPearance CLEAR CLEAR   Specific Gravity, Urine 1.012 1.005 - 1.030   pH 5.0 5.0 - 8.0   Glucose, UA NEGATIVE NEGATIVE mg/dL   Hgb urine dipstick SMALL (A) NEGATIVE   Bilirubin Urine NEGATIVE NEGATIVE   Ketones, ur NEGATIVE NEGATIVE mg/dL   Protein, ur 30 (A) NEGATIVE mg/dL   Nitrite NEGATIVE NEGATIVE   Leukocytes,Ua SMALL (A) NEGATIVE   RBC / HPF 0-5 0 - 5 RBC/hpf   WBC, UA 21-50 0 - 5 WBC/hpf   Bacteria, UA RARE (A)  NONE SEEN   Squamous Epithelial / LPF 0-5 0 - 5   Mucus PRESENT     Comment: Performed at [facility], [place] [address] [place], [place] [address]  Urine culture     Status: Abnormal (Preliminary result)   Collection Time: 10/14/2020 11:02 AM   Specimen: In/Out Cath Urine  Result Value Ref Range   Specimen Description      IN/OUT CATH URINE Performed at [facility], [place] [address], [place], [place] [address]    Special Requests      NONE Performed at [facility], [place] [address], [place], [place] [address]    Culture (A)     >=100,000 COLONIES/mL KLEBSIELLA PNEUMONIAE SUSCEPTIBILITIES TO FOLLOW Performed at [facility], [place] [address] [place], [place] [address]    Report Status PENDING   POC occult blood, ED     Status: Abnormal   Collection Time: 10/25/2020 12:00 PM  Result Value Ref Range   Fecal Occult Bld POSITIVE (A) NEGATIVE  Lactic acid, plasma     Status: None   Collection Time: 10/09/2020 12:13 PM  Result Value Ref Range   Lactic Acid, Venous 1.1 0.5 - 1.9 mmol/L    Comment: Performed at [facility], [place] [address] [place], [place] [address]  Hemoglobin and hematocrit, blood     Status: Abnormal   Collection Time: 10/09/2020  3:37 PM  Result Value Ref Range   Hemoglobin 7.8 (L) 13.0 - 17.0 g/dL   HCT 23.9 (L) 39.0 - 52.0 %    Comment: Performed at [facility], [place] [address], [place], [place] [address]  Glucose, capillary     Status: None   Collection Time: 10/27/2020  6:07 PM  Result Value Ref Range   Glucose-Capillary 91 70 - 99 mg/dL    Comment: Glucose reference range applies only to samples taken after fasting for at least 8 hours.  Glucose, capillary     Status: None   Collection Time: 10/18/2020  9:29 PM  Result Value Ref Range   Glucose-Capillary 72 70 - 99 mg/dL    Comment: Glucose reference range applies only to samples taken after fasting for at least 8  hours.   Comment 1  Notify RN    Comment 2 Document in Chart   Comprehensive metabolic panel     Status: Abnormal   Collection Time: 10/29/2020 11:52 PM  Result Value Ref Range   Sodium 132 (L) 135 - 145 mmol/L   Potassium 3.5 3.5 - 5.1 mmol/L   Chloride 99 98 - 111 mmol/L   CO2 23 22 - 32 mmol/L   Glucose, Bld 104 (H) 70 - 99 mg/dL    Comment: Glucose reference range applies only to samples taken after fasting for at least 8 hours.   BUN 39 (H) 8 - 23 mg/dL   Creatinine, Ser 1.29 (H) 0.61 - 1.24 mg/dL   Calcium 9.2 8.9 - 10.3 mg/dL   Total Protein 4.9 (L) 6.5 - 8.1 g/dL   Albumin 1.7 (L) 3.5 - 5.0 g/dL   AST 32 15 - 41 U/L   ALT 69 (H) 0 - 44 U/L   Alkaline Phosphatase 44 38 - 126 U/L   Total Bilirubin 1.0 0.3 - 1.2 mg/dL   GFR, Estimated 57 (L) >60 mL/min    Comment: (NOTE) Calculated using the CKD-EPI Creatinine Equation (2021)    Anion gap 10 5 - 15    Comment: Performed at [facility], [place] [address] [place], [place] [address]  CBC     Status: Abnormal   Collection Time: 10/07/2020 11:52 PM  Result Value Ref Range   WBC 5.7 4.0 - 10.5 K/uL   RBC 2.78 (L) 4.22 - 5.81 MIL/uL   Hemoglobin 8.2 (L) 13.0 - 17.0 g/dL   HCT 24.5 (L) 39.0 - 52.0 %   MCV 88.1 80.0 - 100.0 fL   MCH 29.5 26.0 - 34.0 pg   MCHC 33.5 30.0 - 36.0 g/dL   RDW 18.2 (H) 11.5 - 15.5 %   Platelets 109 (L) 150 - 400 K/uL    Comment: Immature Platelet Fraction may be clinically indicated, consider ordering this additional test [id number] CONSISTENT WITH PREVIOUS RESULT REPEATED TO VERIFY    nRBC 0.0 0.0 - 0.2 %    Comment: Performed at [facility], [place] [address] [place], [place] [address]  Glucose, capillary     Status: None   Collection Time: 10/09/2020  8:22 AM  Result Value Ref Range   Glucose-Capillary 94 70 - 99 mg/dL    Comment: Glucose reference range applies only to samples taken after fasting for at least 8 hours.   Comment 1 Notify RN    Comment 2  Document in Chart   Glucose, capillary     Status: None   Collection Time: 10/25/2020 12:13 PM  Result Value Ref Range   Glucose-Capillary 91 70 - 99 mg/dL    Comment: Glucose reference range applies only to samples taken after fasting for at least 8 hours.  CBC     Status: Abnormal   Collection Time: 10/14/2020  2:20 PM  Result Value Ref Range   WBC 7.1 4.0 - 10.5 K/uL   RBC 3.03 (L) 4.22 - 5.81 MIL/uL   Hemoglobin 8.8 (L) 13.0 - 17.0 g/dL   HCT 27.2 (L) 39.0 - 52.0 %   MCV 89.8 80.0 - 100.0 fL   MCH 29.0 26.0 - 34.0 pg   MCHC 32.4 30.0 - 36.0 g/dL   RDW 18.5 (H) 11.5 - 15.5 %   Platelets 125 (L) 150 - 400 K/uL   nRBC 0.0 0.0 - 0.2 %    Comment: Performed at [facility], [place]  [address] [place], [place] [address]  Glucose, capillary     Status: Abnormal   Collection Time: 10/02/2020  4:10 PM  Result Value Ref Range   Glucose-Capillary 118 (H) 70 - 99 mg/dL    Comment: Glucose reference range applies only to samples taken after fasting for at least 8 hours.   Comment 1 Notify RN    Comment 2 Document in Chart     MICRO: 3/21 MRSA bacteremia  3/21 kleb -urine cx IMAGING: DG Shoulder Right  Result Date: 10/24/2020 CLINICAL DATA:  Right shoulder pain. EXAM: RIGHT SHOULDER - 2+ VIEW COMPARISON:  None. FINDINGS: There is no evidence of fracture or dislocation. There is no evidence of arthropathy or other focal bone abnormality. Soft tissues are unremarkable. IMPRESSION: Negative. Electronically Signed   By: Marijo Conception M.D.   On: 10/25/2020 10:52   DG Chest Port 1 View  Result Date: 10/12/2020 CLINICAL DATA:  Sepsis. EXAM: PORTABLE CHEST 1 VIEW COMPARISON:  September 21, 2020. FINDINGS: Stable cardiomegaly. Status post coronary bypass graft. Left-sided pacemaker is unchanged in position. No pneumothorax or pleural effusion is noted. Lungs are clear. Bony thorax is unremarkable. IMPRESSION: No active disease. Electronically Signed   By: Marijo Conception M.D.   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 Source of bacteremia possibly due to sacral wounds vs. Suspicious left hand lesion/abscess?  mrsa bacteremia = - will change antibiotics and place on daptomycin - can d/c cefepime - please get TTE but ultimately need to decide if can get TEE to evaluate icd (if positive, need to decide further management given his other health issues)  Esophageal candidiasis = will treat with fluconazole 400mg  iv daily  Sacral wound = agree with recommendations from wound care nurse.   Upper gi bleed = continue on pantoprazole  Spinal hemangioma = getting repeat MRI of spine,can also see if any other signs of infection  Moderate protein calorie malnutrition = would evaluate to see if he is meeting nutritional requirements.

## 2020-10-20 NOTE — Anesthesia Preprocedure Evaluation (Addendum)
Anesthesia Evaluation  Patient identified by MRN, date of birth, ID band Patient awake    Reviewed: Allergy & Precautions, NPO status , Patient's Chart, lab work & pertinent test results, reviewed documented beta blocker date and time   History of Anesthesia Complications Negative for: history of anesthetic complications  Airway Mallampati: II  TM Distance: >3 FB Neck ROM: Full    Dental  (+) Missing,    Pulmonary sleep apnea , former smoker,    Pulmonary exam normal        Cardiovascular hypertension, Pt. on medications and Pt. on home beta blockers + CAD and + CABG (2016)  Normal cardiovascular exam+ dysrhythmias (on Eliquis) Atrial Fibrillation + pacemaker + Cardiac Defibrillator   TTE 08/2020 [facility]): EF 35-40%, mild LVH, global hypokinesis, worse inferiorly, RV systolic function mildly reduced, severe LAE, mild RAE, mild AR, mild to moderate MR   Neuro/Psych negative neurological ROS  negative psych ROS   GI/Hepatic negative GI ROS, Neg liver ROS,   Endo/Other  diabetes, Type 2, Oral Hypoglycemic AgentsHypothyroidism   Renal/GU Renal InsufficiencyRenal disease  negative genitourinary   Musculoskeletal negative musculoskeletal ROS (+)   Abdominal   Peds  Hematology Hgb 8.2, plt 109   Anesthesia Other Findings Day of surgery medications reviewed with patient.  Reproductive/Obstetrics negative OB ROS                            Anesthesia Physical Anesthesia Plan  ASA: IV  Anesthesia Plan: MAC   Post-op Pain Management:    Induction:   PONV Risk Score and Plan: Treatment may vary due to age or medical condition and Propofol infusion  Airway Management Planned: Natural Airway and Nasal Cannula  Additional Equipment: None  Intra-op Plan:   Post-operative Plan:   Informed Consent: I have reviewed the patients History and Physical, chart, labs and discussed the procedure  including the risks, benefits and alternatives for the proposed anesthesia with the patient or authorized representative who has indicated his/her understanding and acceptance.   Patient has DNR.  Discussed DNR with patient and Suspend DNR.     Plan Discussed with: CRNA  Anesthesia Plan Comments:        Anesthesia Quick Evaluation

## 2020-10-20 NOTE — Progress Notes (Signed)
PHARMACY - PHYSICIAN COMMUNICATION CRITICAL VALUE ALERT - BLOOD CULTURE IDENTIFICATION (BCID)  Timothy Neal is an 80 y.o. male who presented to [facility] on 10/23/2020 with a chief complaint of weakness, BLE cellulitis and sacral wound  Assessment:  4/4 GPC, staph aureus, Mec AC+  Name of physician (or Provider) Contacted: Hal Hope  Current antibiotics: cefepime and vancomycin  Changes to prescribed antibiotics recommended:  none  Results for orders placed or performed during the hospital encounter of 09/29/2020  Blood Culture ID Panel (Reflexed) (Collected: 10/15/2020 10:15 AM)  Result Value Ref Range   Enterococcus faecalis NOT DETECTED NOT DETECTED   Enterococcus Faecium NOT DETECTED NOT DETECTED   Listeria monocytogenes NOT DETECTED NOT DETECTED   Staphylococcus species DETECTED (A) NOT DETECTED   Staphylococcus aureus (BCID) DETECTED (A) NOT DETECTED   Staphylococcus epidermidis NOT DETECTED NOT DETECTED   Staphylococcus lugdunensis NOT DETECTED NOT DETECTED   Streptococcus species NOT DETECTED NOT DETECTED   Streptococcus agalactiae NOT DETECTED NOT DETECTED   Streptococcus pneumoniae NOT DETECTED NOT DETECTED   Streptococcus pyogenes NOT DETECTED NOT DETECTED   A.calcoaceticus-baumannii NOT DETECTED NOT DETECTED   Bacteroides fragilis NOT DETECTED NOT DETECTED   Enterobacterales NOT DETECTED NOT DETECTED   Enterobacter cloacae complex NOT DETECTED NOT DETECTED   Escherichia coli NOT DETECTED NOT DETECTED   Klebsiella aerogenes NOT DETECTED NOT DETECTED   Klebsiella oxytoca NOT DETECTED NOT DETECTED   Klebsiella pneumoniae NOT DETECTED NOT DETECTED   Proteus species NOT DETECTED NOT DETECTED   Salmonella species NOT DETECTED NOT DETECTED   Serratia marcescens NOT DETECTED NOT DETECTED   Haemophilus influenzae NOT DETECTED NOT DETECTED   Neisseria meningitidis NOT DETECTED NOT DETECTED   Pseudomonas aeruginosa NOT DETECTED NOT DETECTED   Stenotrophomonas maltophilia NOT  DETECTED NOT DETECTED   Candida albicans NOT DETECTED NOT DETECTED   Candida auris NOT DETECTED NOT DETECTED   Candida glabrata NOT DETECTED NOT DETECTED   Candida krusei NOT DETECTED NOT DETECTED   Candida parapsilosis NOT DETECTED NOT DETECTED   Candida tropicalis NOT DETECTED NOT DETECTED   Cryptococcus neoformans/gattii NOT DETECTED NOT DETECTED   Meth resistant mecA/C and MREJ DETECTED (A) NOT DETECTED    Angela Adam 10/15/2020  2:54 AM

## 2020-10-20 NOTE — Consult Note (Signed)
[place] Nurse Consult Note: Reason for Consult: Numerous skin lesions to buttocks, Unstageable PI to left IT, weeping areas of partial skin loss to the bilateral LEs, erythema and skin loss at right buttock, scrotum and medial thighs, trauma to left hand, first digit. Wound type: IAD, pressure, moisture, trauma Pressure Injury POA: Yes/No/NA Measurement: Left IT:  3cm x 4cm with depth obscured by the presence of black eschar. No drainage Left hand, first digit:  2cm  X0.8cm blood filled blister. Right buttock with 5 lesions within a 6cm x 6cm area that are scabbed and dry. Scrotum and medial thighs: erythematous with linear excoriations in various stages of healing: full and partial thickness. Bilateral LEs with ruptured blisters draining serous exudate in a small to moderate amount. 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  Topical care to the bilateral LEs will be with xeroform gauze, abd pads and secured with Kerlix with daily changes. The traumatic skin injury to the left hand (a blood-filled blister measuring 2cm x 0.8cm), first digit (injured while clipping nails) will be painted with betadine until it dries, turns to eschar and autolytically debrides.  The buttocks present with numerous circular skin lesions non inconsistent with both IAD and herpetic lesions. There is a circular area of necrosis at the left ischial tuberosity.   Gerhart's butt cream (a compounded 1:1:1 cream of hydrocortisone:lotrumin:zinc oxide) is provided for the buttock, scrotal and medial thigh lesions.  Collagenase (Santyl) is applied to the Unstageable PI.   [facility] nursing team will not  follow, but will remain available to this patient, the nursing and medical teams.  Please re-consult if needed. Thanks, Maudie Flakes, MSN, RN, [place], Arther Abbott  Pager# [phone]

## 2020-10-20 NOTE — Consult Note (Signed)
Subjective: I was contacted regarding this patient.  I have seen him in the past secondary to his thoracic lesion and lower extremity weakness.  He was readmitted with anemia, fever and generalized weakness.  Objective: Vital signs in last 24 hours: Temp:  [97.7 F (36.5 C)-100.3 F (37.9 C)] 98.2 F (36.8 C) (03/22 0656) Pulse Rate:  [68-81] 78 (03/22 0656) Resp:  [14-22] 17 (03/22 0656) BP: (110-136)/(53-63) 136/59 (03/22 0656) SpO2:  [90 %-100 %] 90 % (03/22 0656) Weight:  [72 kg] 72 kg (03/21 2100) Estimated body mass index is 24.86 kg/m as calculated from the following:   Height as of this encounter: 5\' 7"  (1.702 m).   Weight as of this encounter: 72 kg.   Intake/Output from previous day: 03/21 0701 - 03/22 0700 In: 686.7 [IV Piggyback:686.7] Out: 2202 [Urine:1350] Intake/Output this shift: No intake/output data recorded.    Lab Results: Recent Labs    10/03/2020 1013 10/18/2020 1537 10/06/2020 2352  WBC 5.8  --  5.7  HGB 8.2* 7.8* 8.2*  HCT 24.8* 23.9* 24.5*  PLT 106*  --  109*   BMET Recent Labs    10/02/2020 1013 10/18/2020 2352  NA 134* 132*  K 3.6 3.5  CL 100 99  CO2 27 23  GLUCOSE 121* 104*  BUN 40* 39*  CREATININE 1.38* 1.29*  CALCIUM 9.4 9.2    Studies/Results: DG Shoulder Right  Result Date: 09/30/2020 CLINICAL DATA:  Right shoulder pain. EXAM: RIGHT SHOULDER - 2+ VIEW COMPARISON:  None. FINDINGS: There is no evidence of fracture or dislocation. There is no evidence of arthropathy or other focal bone abnormality. Soft tissues are unremarkable. IMPRESSION: Negative. Electronically Signed   By: Marijo Conception M.D.   On: 10/23/2020 10:52   DG Chest Port 1 View  Result Date: 10/18/2020 CLINICAL DATA:  Sepsis. EXAM: PORTABLE CHEST 1 VIEW COMPARISON:  September 21, 2020. FINDINGS: Stable cardiomegaly. Status post coronary bypass graft. Left-sided pacemaker is unchanged in position. No pneumothorax or pleural effusion is noted. Lungs are clear. Bony thorax  is unremarkable. IMPRESSION: No active disease. Electronically Signed   By: Marijo Conception M.D.   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  I would suggest repeating his thoracic MRI to firm his tumor is stable.  Nonetheless, he is a poor surgical candidate.  Surgery would be risky with  high blood loss, and likely not a good idea in his case.  LOS: 0 days     Ophelia Charter 10/23/2020, 7:54 AM

## 2020-10-20 NOTE — Progress Notes (Signed)
PROGRESS NOTE  Timothy Neal AST:[id number] DOB: 1941/01/20 DOA: 10/09/2020 PCP: [name], MD  HPI/Recap of past 24 hours: HPI from Dr Babs Sciara is a 80 y.o. 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 Presenting with increased weakness over the last week with anemia as well as dark stools. 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 He normally is able to use a walker to move at least 15 yards per his sister at bedside. Over the last week, he's had increasing overall weakness and lethargy; as well as an acute inability to use his walker or move on his own. She notes that his sacral wound seems worse and may have had some subjective fever. He c/o on shoulder pain, but she notes that he had been putting extra strain on that right shoulder. She believes that he may have injured it during those exercises.  Of note, patient came from [facility].  In the ED, pt was found to have a 3.5 pt drop in his Hgb and he was FOBT positive. GI was consulted. There was concern for worsening BLE weakness. Neurosurgery was consulted. L hand cellulitis was noted. Blood cx noted to grow MRSA. Patient was started on vanc. TRH was called for admission.      Today, patient denies any new complaints except for having to be changed very frequently due to frequent stools status post bowel prep.  Denies any worsening lower extremity weakness, chest pain, shortness of breath, abdominal pain, nausea/vomiting, no further fevers noted.  Met patient sister at bedside.    Assessment/Plan: Active Problems:   GIB (gastrointestinal bleeding)   Symptomatic anemia   Heme positive stool   Multiple gastric ulcers   Hiatal hernia   Possible upper GI bleed likely 2/2 Cameron lesions and gastric ulcers Iron deficiency  anemia History of possible GI bleed Baseline hemoglobin around 11-12, on presentation 8.2 FOBT positive Anemia panel showed iron 18, sats 15, ferritin 819, vitamin B12-->1,314 GI consulted, EGD on 10/25/2020 showed 10 cm hiatal hernia with multiple Cameron lesions, esophageal candidiasis, 10 nonbleeding gastric ulcers with no stigmata of bleeding.  Biopsies were taken for H. pylori testing, await final results Continue PPI Start fluconazole for esophageal candidiasis Oral iron supplementation Continue to hold Eliquis until hemoglobin stable in the next few days as per GI Advance diet as tolerated Frequent CBC  BLE weakness History of spinal hemangioma Neurosurgery Dr. Arnoldo Morale consulted, recommend repeat thoracic MRI spine, due to worsening symptoms.  Not a surgical candidate.  Recommend to continue radiation therapy MRI thoracic spine pending Continue steroids PT/OT  Possible MRSA bacteremia Noted 100.3 temp on 10/09/2020, no leukocytosis Blood culture growing MRSA, repeat pending TTE pending, may need TEE Auto-consult from ID pending for further recommendation Continue IV vancomycin  Possible Left hand cellulitis Noted sacral wound Wound care consult IV vancomycin, cefepime as above  Klebsiella pneumonia UTI Urine culture growing greater than 100,000 Continue IV cefepime for now  Right shoulder pain Right shoulder x-ray negative Pain control PT/OT  CKD stage IIIa Stable Daily BMP  Chronic systolic HF s/p AICD CAD s/p CABG, HTN, HLD Paroxysmal A. Fib Appears euvolemic, s/p gentle hydration No echo on file, TTE pending Continue sotalol, Coreg, digoxin, statins Hold Eliquis as mentioned above Hold Lasix, Vasotec, for now and restart when BP Telemetry  Diabetes mellitus  type 2 Last A1c on 2/22--> 6.7 SSI, Accu-Cheks, hypoglycemic protocol Hold home regimen  BPH Chronic urinary retention Performs self in and out cath, continue Continue Proscar     Estimated  body mass index is 24.86 kg/m as calculated from the following:   Height as of this encounter: '5\' 7"'  (1.702 m).   Weight as of this encounter: 72 kg.     Code Status: Partial code  Family Communication: Discussed with sister at bedside  Disposition Plan: Status is: Observation  The patient will require care spanning > 2 midnights and should be moved to inpatient because: Inpatient level of care appropriate due to severity of illness  Dispo: The patient is from: ILF              Anticipated d/c is to: ALF              Patient currently is not medically stable to d/c.   Difficult to place patient No    Consultants:  GI  Neurosurgery  Procedures:  Endoscopy on 10/07/2020  Antimicrobials:  Vancomycin  Cefepime  DVT prophylaxis: SCD-holding Eliquis   Objective: Vitals:   10/25/2020 1205 10/18/2020 1324 10/22/2020 1330 10/05/2020 1340  BP: (!) 146/56 (!) 119/52 127/90 (!) 151/62  Pulse: 71 84 84 83  Resp: 20 (!) '22 20 12  ' Temp: 97.7 F (36.5 C) 97.9 F (36.6 C)    TempSrc: Oral Axillary    SpO2: 97% 100% 100% 100%  Weight:      Height:        Intake/Output Summary (Last 24 hours) at 09/29/2020 1353 Last data filed at 10/29/2020 1325 Gross per 24 hour  Intake 686.71 ml  Output 1350 ml  Net -663.29 ml   Filed Weights   10/13/2020 2100  Weight: 72 kg    Exam:  General: NAD, chronically ill-appearing  Cardiovascular: S1, S2 present  Respiratory: CTAB  Abdomen: Soft, nontender, nondistended, bowel sounds present  Musculoskeletal: No bilateral pedal edema noted, noted bilateral erythema/excoriations  Skin:  Noted BLE excoriations, sacral wound noted, LUE cellulitis noted  Psychiatry: Normal mood  Neurology: Normal strength in BUE, 3/5 in BLE, sensation intact, no obvious focal neurologic deficits noted    Data Reviewed: CBC: Recent Labs  Lab 10/25/2020 1013 10/02/2020 1537 10/07/2020 2352  WBC 5.8  --  5.7  NEUTROABS 5.3  --   --   HGB 8.2* 7.8* 8.2*   HCT 24.8* 23.9* 24.5*  MCV 88.3  --  88.1  PLT 106*  --  413*   Basic Metabolic Panel: Recent Labs  Lab 10/15/2020 1013 10/08/2020 2352  NA 134* 132*  K 3.6 3.5  CL 100 99  CO2 27 23  GLUCOSE 121* 104*  BUN 40* 39*  CREATININE 1.38* 1.29*  CALCIUM 9.4 9.2   GFR: Estimated Creatinine Clearance: 44.1 mL/min (A) (by C-G formula based on SCr of 1.29 mg/dL (H)). Liver Function Tests: Recent Labs  Lab 10/16/2020 1013 10/27/2020 2352  AST 32 32  ALT 69* 69*  ALKPHOS 43 44  BILITOT 0.9 1.0  PROT 4.9* 4.9*  ALBUMIN 1.7* 1.7*   No results for input(s): LIPASE, AMYLASE in the last 168 hours. No results for input(s): AMMONIA in the last 168 hours. Coagulation Profile: Recent Labs  Lab 10/22/2020 1013  INR 2.2*   Cardiac Enzymes: No results for input(s): CKTOTAL, CKMB, CKMBINDEX, TROPONINI in the last 168 hours. BNP (last 3 results) No results for input(s): PROBNP in the last 8760 hours. HbA1C: No  results for input(s): HGBA1C in the last 72 hours. CBG: Recent Labs  Lab 10/04/2020 1807 10/13/2020 2129 10/05/2020 0822 10/01/2020 1213  GLUCAP 91 72 94 91   Lipid Profile: No results for input(s): CHOL, HDL, LDLCALC, TRIG, CHOLHDL, LDLDIRECT in the last 72 hours. Thyroid Function Tests: No results for input(s): TSH, T4TOTAL, FREET4, T3FREE, THYROIDAB in the last 72 hours. Anemia Panel: Recent Labs    09/30/2020 1013  VITAMINB12 1,314*  FOLATE 18.6  FERRITIN 809*  TIBC 124*  IRON 18*  RETICCTPCT 1.4   Urine analysis:    Component Value Date/Time   COLORURINE YELLOW 10/08/2020 1102   APPEARANCEUR CLEAR 10/18/2020 1102   LABSPEC 1.012 10/10/2020 1102   PHURINE 5.0 09/29/2020 1102   GLUCOSEU NEGATIVE 10/12/2020 1102   HGBUR SMALL (A) 10/29/2020 1102   BILIRUBINUR NEGATIVE 10/21/2020 1102   KETONESUR NEGATIVE 10/07/2020 1102   PROTEINUR 30 (A) 10/17/2020 1102   NITRITE NEGATIVE 10/04/2020 1102   LEUKOCYTESUR SMALL (A) 10/29/2020 1102   Sepsis  Labs: '@LABRCNTIP' (procalcitonin:4,lacticidven:4)  ) Recent Results (from the past 240 hour(s))  Blood Culture (routine x 2)     Status: None (Preliminary result)   Collection Time: 10/06/2020 10:00 AM   Specimen: BLOOD LEFT HAND  Result Value Ref Range Status   Specimen Description   Final    BLOOD LEFT HAND Performed at [facility] [address] [place], [place] [address]    Special Requests   Final    BOTTLES DRAWN AEROBIC AND ANAEROBIC Blood Culture results may not be optimal due to an inadequate volume of blood received in culture bottles Performed at [facility] [address] [place], [place] [address]    Culture  Setup Time   Final    IN BOTH AEROBIC AND ANAEROBIC BOTTLES GRAM POSITIVE COCCI CRITICAL VALUE NOTED.  VALUE IS CONSISTENT WITH PREVIOUSLY REPORTED AND CALLED VALUE.    Culture   Final    NO GROWTH < 24 HOURS Performed at [facility], [place] [address], [place], [place] [address]    Report Status PENDING  Incomplete  Resp Panel by RT-PCR (Flu A&B, Covid) Nasopharyngeal Swab     Status: None   Collection Time: 10/22/2020 10:13 AM   Specimen: Nasopharyngeal Swab; Nasopharyngeal(NP) swabs in vial transport medium  Result Value Ref Range Status   SARS Coronavirus 2 by RT PCR NEGATIVE NEGATIVE Final    Comment: (NOTE) SARS-CoV-2 target nucleic acids are NOT DETECTED.  The SARS-CoV-2 RNA is generally detectable in upper respiratory specimens during the acute phase of infection. The lowest concentration of SARS-CoV-2 viral copies this assay can detect is 138 copies/mL. A negative result does not preclude SARS-Cov-2 infection and should not be used as the sole basis for treatment or other patient management decisions. A negative result may occur with  improper specimen collection/handling, submission of specimen other than nasopharyngeal swab, presence of viral mutation(s) within the areas targeted by this assay, and  inadequate number of viral copies(<138 copies/mL). A negative result must be combined with clinical observations, patient history, and epidemiological information. The expected result is Negative.  Fact Sheet for Patients:  EntrepreneurPulse.com.au  Fact Sheet for Healthcare Providers:  IncredibleEmployment.be  This test is no t yet approved or cleared by the [place] FDA and  has been authorized for detection and/or diagnosis of SARS-CoV-2 by FDA under an Emergency Use Authorization (EUA). This EUA will remain  in effect (meaning this test can be used) for the duration  of the COVID-19 declaration under Section 564(b)(1) of the Act, 21 U.S.C.section 360bbb-3(b)(1), unless the authorization is terminated  or revoked sooner.       Influenza A by PCR NEGATIVE NEGATIVE Final   Influenza B by PCR NEGATIVE NEGATIVE Final    Comment: (NOTE) The Xpert Xpress SARS-CoV-2/FLU/RSV plus assay is intended as an aid in the diagnosis of influenza from Nasopharyngeal swab specimens and should not be used as a sole basis for treatment. Nasal washings and aspirates are unacceptable for Xpert Xpress SARS-CoV-2/FLU/RSV testing.  Fact Sheet for Patients: EntrepreneurPulse.com.au  Fact Sheet for Healthcare Providers: IncredibleEmployment.be  This test is not yet approved or cleared by the [place] FDA and has been authorized for detection and/or diagnosis of SARS-CoV-2 by FDA under an Emergency Use Authorization (EUA). This EUA will remain in effect (meaning this test can be used) for the duration of the COVID-19 declaration under Section 564(b)(1) of the Act, 21 U.S.C. section 360bbb-3(b)(1), unless the authorization is terminated or revoked.  Performed at [facility], [place] [address] [place], [place] [address]   Blood Culture (routine x 2)     Status: None (Preliminary result)   Collection  Time: 09/30/2020 10:15 AM   Specimen: BLOOD  Result Value Ref Range Status   Specimen Description   Final    BLOOD LEFT ANTECUBITAL Performed at [facility] [address], [place], [place] [address]    Special Requests   Final    BOTTLES DRAWN AEROBIC AND ANAEROBIC Blood Culture results may not be optimal due to an inadequate volume of blood received in culture bottles Performed at [facility] [address], [place], [place] [address]    Culture  Setup Time   Final    IN BOTH AEROBIC AND ANAEROBIC BOTTLES GRAM POSITIVE COCCI Organism ID to follow CRITICAL RESULT CALLED TO, READ BACK BY AND VERIFIED WITH: E JACKSON [facility] 09/29/2020 0245 JDW    Culture   Final    CULTURE REINCUBATED FOR BETTER GROWTH Performed at [facility], [place] [address], [place], [place] [address]    Report Status PENDING  Incomplete  Blood Culture ID Panel (Reflexed)     Status: Abnormal   Collection Time: 10/22/2020 10:15 AM  Result Value Ref Range Status   Enterococcus faecalis NOT DETECTED NOT DETECTED Final   Enterococcus Faecium NOT DETECTED NOT DETECTED Final   Listeria monocytogenes NOT DETECTED NOT DETECTED Final   Staphylococcus species DETECTED (A) NOT DETECTED Final    Comment: CRITICAL RESULT CALLED TO, READ BACK BY AND VERIFIED WITH: E JACKSON [facility]AR[facility] 10/10/2020 0245 JDW    Staphylococcus aureus (BCID) DETECTED (A) NOT DETECTED Final    Comment: Methicillin (oxacillin)-resistant Staphylococcus aureus (MRSA). MRSA is predictably resistant to beta-lactam antibiotics (except ceftaroline). Preferred therapy is vancomycin unless clinically contraindicated. Patient requires contact precautions if  hospitalized. CRITICAL RESULT CALLED TO, READ BACK BY AND VERIFIED WITH: E JACKSON PHARMD 10/14/2020 0245 JDW    Staphylococcus epidermidis NOT DETECTED NOT DETECTED Final   Staphylococcus lugdunensis NOT DETECTED NOT DETECTED Final   Streptococcus species NOT DETECTED  NOT DETECTED Final   Streptococcus agalactiae NOT DETECTED NOT DETECTED Final   Streptococcus pneumoniae NOT DETECTED NOT DETECTED Final   Streptococcus pyogenes NOT DETECTED NOT DETECTED Final   A.calcoaceticus-baumannii NOT DETECTED NOT DETECTED Final   Bacteroides fragilis NOT DETECTED NOT DETECTED Final   Enterobacterales NOT DETECTED NOT DETECTED Final   Enterobacter cloacae complex NOT DETECTED NOT DETECTED  Final   Escherichia coli NOT DETECTED NOT DETECTED Final   Klebsiella aerogenes NOT DETECTED NOT DETECTED Final   Klebsiella oxytoca NOT DETECTED NOT DETECTED Final   Klebsiella pneumoniae NOT DETECTED NOT DETECTED Final   Proteus species NOT DETECTED NOT DETECTED Final   Salmonella species NOT DETECTED NOT DETECTED Final   Serratia marcescens NOT DETECTED NOT DETECTED Final   Haemophilus influenzae NOT DETECTED NOT DETECTED Final   Neisseria meningitidis NOT DETECTED NOT DETECTED Final   Pseudomonas aeruginosa NOT DETECTED NOT DETECTED Final   Stenotrophomonas maltophilia NOT DETECTED NOT DETECTED Final   Candida albicans NOT DETECTED NOT DETECTED Final   Candida auris NOT DETECTED NOT DETECTED Final   Candida glabrata NOT DETECTED NOT DETECTED Final   Candida krusei NOT DETECTED NOT DETECTED Final   Candida parapsilosis NOT DETECTED NOT DETECTED Final   Candida tropicalis NOT DETECTED NOT DETECTED Final   Cryptococcus neoformans/gattii NOT DETECTED NOT DETECTED Final   Meth resistant mecA/C and MREJ DETECTED (A) NOT DETECTED Final    Comment: CRITICAL RESULT CALLED TO, READ BACK BY AND VERIFIED WITHSeleta Rhymes [facility] 10/18/2020 0245 JDW Performed at [facility], [address], [place], [place] [address]   Urine culture     Status: Abnormal (Preliminary result)   Collection Time: 10/09/2020 11:02 AM   Specimen: In/Out Cath Urine  Result Value Ref Range Status   Specimen Description   Final    IN/OUT CATH URINE Performed at [facility]  [address], [place], [place] [address]    Special Requests   Final    NONE Performed at [facility], [place] [address], [place], [place] [address]    Culture (A)  Final    >=100,000 COLONIES/mL GRAM NEGATIVE RODS SUSCEPTIBILITIES TO FOLLOW Performed at [facility], [place] [address], [place], [place] [address]    Report Status PENDING  Incomplete      Studies: No results found.  Scheduled Meds: . [MAR Hold] atorvastatin  40 mg Oral Once per day on Mon Wed Fri  . [MAR Hold] carvedilol  12.5 mg Oral BID  . [MAR Hold] dexamethasone  12 mg Oral Q8H  . [MAR Hold] digoxin  0.125 mg Oral Daily  . [MAR Hold] finasteride  5 mg Oral QHS  . [MAR Hold] insulin aspart  0-15 Units Subcutaneous TID WC  . [MAR Hold] insulin aspart  0-5 Units Subcutaneous QHS  . lip balm      . [MAR Hold] pantoprazole (PROTONIX) IV  40 mg Intravenous Q12H  . [MAR Hold] sotalol  80 mg Oral Daily   And  . [MAR Hold] sotalol  120 mg Oral QHS  . [MAR Hold] tamsulosin  0.4 mg Oral Daily    Continuous Infusions: . [MAR Hold] ceFEPime (MAXIPIME) IV 2 g (10/27/2020 0357)  . lactated ringers 10 mL/hr at 10/04/2020 1240  . [MAR Hold] vancomycin       LOS: 0 days     Alma Friendly, MD [facility]  If 7PM-7AM, please contact night-coverage [email or web address] 10/12/2020, 1:53 PM

## 2020-10-20 NOTE — Anesthesia Postprocedure Evaluation (Signed)
Anesthesia Post Note  Patient: Timothy Neal  Procedure(s) Performed: ENTEROSCOPY (N/A ) BIOPSY     Patient location during evaluation: PACU Anesthesia Type: MAC Level of consciousness: awake and alert and oriented Pain management: pain level controlled Vital Signs Assessment: post-procedure vital signs reviewed and stable Respiratory status: spontaneous breathing, nonlabored ventilation and respiratory function stable Cardiovascular status: blood pressure returned to baseline Postop Assessment: no apparent nausea or vomiting Anesthetic complications: no   No complications documented.  Last Vitals:  Vitals:   10/05/2020 1340 10/10/2020 1350  BP: (!) 151/62 (!) 155/87  Pulse: 83 80  Resp: 12 (!) 23  Temp:    SpO2: 100% 98%    Last Pain:  Vitals:   10/14/2020 1350  TempSrc:   PainSc: 0-No pain                 Brennan Bailey

## 2020-10-20 NOTE — Progress Notes (Signed)
Yesterday I called and left a voicemail for the patient's sister after she had tried contacting our department. I called her back today and again had to leave a voicemail. I called the patient in his hospital room to let him know we were aware of what was going on with his admission and left him a voicemail. He is being worked up for anemia and with undergo EGD with [facility] +/- endoscopically placed capsule for imaging of the small bowel if no obvious proximal site of bleeding to account for his anemia. We would like to proceed with radiotherapy today as long as this doesn't interfere with his plans with GI.     Carola Rhine, PAC

## 2020-10-21 ENCOUNTER — Encounter (HOSPITAL_COMMUNITY): Payer: Self-pay | Admitting: Gastroenterology

## 2020-10-21 ENCOUNTER — Inpatient Hospital Stay (HOSPITAL_COMMUNITY): Payer: Medicare PPO

## 2020-10-21 ENCOUNTER — Ambulatory Visit
Admission: RE | Admit: 2020-10-21 | Discharge: 2020-10-21 | Disposition: A | Discharge status: Home / Self Care | Payer: Medicare PPO | Source: Ambulatory Visit | Attending: Radiation Oncology | Admitting: Radiation Oncology

## 2020-10-21 DIAGNOSIS — B961 Klebsiella pneumoniae [K. pneumoniae] as the cause of diseases classified elsewhere: Secondary | ICD-10-CM | POA: Diagnosis not present

## 2020-10-21 DIAGNOSIS — K254 Chronic or unspecified gastric ulcer with hemorrhage: Secondary | ICD-10-CM | POA: Diagnosis present

## 2020-10-21 DIAGNOSIS — I429 Cardiomyopathy, unspecified: Secondary | ICD-10-CM | POA: Diagnosis present

## 2020-10-21 DIAGNOSIS — I130 Hypertensive heart and chronic kidney disease with heart failure and stage 1 through stage 4 chronic kidney disease, or unspecified chronic kidney disease: Secondary | ICD-10-CM | POA: Diagnosis present

## 2020-10-21 DIAGNOSIS — L03115 Cellulitis of right lower limb: Secondary | ICD-10-CM | POA: Diagnosis present

## 2020-10-21 DIAGNOSIS — R54 Age-related physical debility: Secondary | ICD-10-CM | POA: Diagnosis present

## 2020-10-21 DIAGNOSIS — L02512 Cutaneous abscess of left hand: Secondary | ICD-10-CM | POA: Diagnosis not present

## 2020-10-21 DIAGNOSIS — Z515 Encounter for palliative care: Secondary | ICD-10-CM | POA: Diagnosis not present

## 2020-10-21 DIAGNOSIS — X509XXA Other and unspecified overexertion or strenuous movements or postures, initial encounter: Secondary | ICD-10-CM | POA: Diagnosis not present

## 2020-10-21 DIAGNOSIS — M25511 Pain in right shoulder: Secondary | ICD-10-CM

## 2020-10-21 DIAGNOSIS — Q398 Other congenital malformations of esophagus: Secondary | ICD-10-CM | POA: Diagnosis not present

## 2020-10-21 DIAGNOSIS — L89150 Pressure ulcer of sacral region, unstageable: Secondary | ICD-10-CM | POA: Diagnosis not present

## 2020-10-21 DIAGNOSIS — B3781 Candidal esophagitis: Secondary | ICD-10-CM | POA: Diagnosis present

## 2020-10-21 DIAGNOSIS — R531 Weakness: Secondary | ICD-10-CM

## 2020-10-21 DIAGNOSIS — R7881 Bacteremia: Secondary | ICD-10-CM

## 2020-10-21 DIAGNOSIS — L89220 Pressure ulcer of left hip, unstageable: Secondary | ICD-10-CM | POA: Diagnosis present

## 2020-10-21 DIAGNOSIS — K259 Gastric ulcer, unspecified as acute or chronic, without hemorrhage or perforation: Secondary | ICD-10-CM | POA: Diagnosis not present

## 2020-10-21 DIAGNOSIS — D649 Anemia, unspecified: Secondary | ICD-10-CM | POA: Diagnosis not present

## 2020-10-21 DIAGNOSIS — L03114 Cellulitis of left upper limb: Secondary | ICD-10-CM | POA: Diagnosis present

## 2020-10-21 DIAGNOSIS — Z20822 Contact with and (suspected) exposure to covid-19: Secondary | ICD-10-CM | POA: Diagnosis present

## 2020-10-21 DIAGNOSIS — Z66 Do not resuscitate: Secondary | ICD-10-CM | POA: Diagnosis not present

## 2020-10-21 DIAGNOSIS — X500XXA Overexertion from strenuous movement or load, initial encounter: Secondary | ICD-10-CM | POA: Diagnosis not present

## 2020-10-21 DIAGNOSIS — K449 Diaphragmatic hernia without obstruction or gangrene: Secondary | ICD-10-CM | POA: Diagnosis not present

## 2020-10-21 DIAGNOSIS — N390 Urinary tract infection, site not specified: Secondary | ICD-10-CM | POA: Diagnosis present

## 2020-10-21 DIAGNOSIS — E8809 Other disorders of plasma-protein metabolism, not elsewhere classified: Secondary | ICD-10-CM | POA: Diagnosis present

## 2020-10-21 DIAGNOSIS — I5022 Chronic systolic (congestive) heart failure: Secondary | ICD-10-CM | POA: Diagnosis present

## 2020-10-21 DIAGNOSIS — R195 Other fecal abnormalities: Secondary | ICD-10-CM | POA: Diagnosis not present

## 2020-10-21 DIAGNOSIS — G9529 Other cord compression: Secondary | ICD-10-CM | POA: Diagnosis present

## 2020-10-21 DIAGNOSIS — G8220 Paraplegia, unspecified: Secondary | ICD-10-CM | POA: Diagnosis present

## 2020-10-21 DIAGNOSIS — L89159 Pressure ulcer of sacral region, unspecified stage: Secondary | ICD-10-CM | POA: Diagnosis present

## 2020-10-21 DIAGNOSIS — K922 Gastrointestinal hemorrhage, unspecified: Secondary | ICD-10-CM | POA: Diagnosis present

## 2020-10-21 DIAGNOSIS — I2510 Atherosclerotic heart disease of native coronary artery without angina pectoris: Secondary | ICD-10-CM | POA: Diagnosis present

## 2020-10-21 DIAGNOSIS — D1809 Hemangioma of other sites: Secondary | ICD-10-CM | POA: Diagnosis present

## 2020-10-21 DIAGNOSIS — L03116 Cellulitis of left lower limb: Secondary | ICD-10-CM | POA: Diagnosis present

## 2020-10-21 DIAGNOSIS — E440 Moderate protein-calorie malnutrition: Secondary | ICD-10-CM | POA: Diagnosis present

## 2020-10-21 DIAGNOSIS — E1122 Type 2 diabetes mellitus with diabetic chronic kidney disease: Secondary | ICD-10-CM | POA: Diagnosis present

## 2020-10-21 LAB — ECHOCARDIOGRAM COMPLETE
AR max vel: 1.23 cm2
AV Area VTI: 1.31 cm2
AV Area mean vel: 1.36 cm2
AV Mean grad: 8 mmHg
AV Peak grad: 16.5 mmHg
Ao pk vel: 2.03 m/s
Area-P 1/2: 2.78 cm2
Height: 67 in
MV M vel: 5.74 m/s
MV Peak grad: 131.8 mmHg
MV VTI: 1.21 cm2
P 1/2 time: 359 msec
Radius: 0.6 cm
S' Lateral: 4.3 cm
Weight: 2539.7 oz

## 2020-10-21 LAB — CBC WITH DIFFERENTIAL/PLATELET
Abs Immature Granulocytes: 0.09 10*3/uL — ABNORMAL HIGH (ref 0.00–0.07)
Basophils Absolute: 0 10*3/uL (ref 0.0–0.1)
Basophils Relative: 0 %
Eosinophils Absolute: 0 10*3/uL (ref 0.0–0.5)
Eosinophils Relative: 0 %
HCT: 24 % — ABNORMAL LOW (ref 39.0–52.0)
Hemoglobin: 7.8 g/dL — ABNORMAL LOW (ref 13.0–17.0)
Immature Granulocytes: 1 %
Lymphocytes Relative: 3 %
Lymphs Abs: 0.2 10*3/uL — ABNORMAL LOW (ref 0.7–4.0)
MCH: 29.4 pg (ref 26.0–34.0)
MCHC: 32.5 g/dL (ref 30.0–36.0)
MCV: 90.6 fL (ref 80.0–100.0)
Monocytes Absolute: 0.2 10*3/uL (ref 0.1–1.0)
Monocytes Relative: 3 %
Neutro Abs: 7.8 10*3/uL — ABNORMAL HIGH (ref 1.7–7.7)
Neutrophils Relative %: 93 %
Platelets: 145 10*3/uL — ABNORMAL LOW (ref 150–400)
RBC: 2.65 MIL/uL — ABNORMAL LOW (ref 4.22–5.81)
RDW: 18.4 % — ABNORMAL HIGH (ref 11.5–15.5)
WBC: 8.4 10*3/uL (ref 4.0–10.5)
nRBC: 0 % (ref 0.0–0.2)

## 2020-10-21 LAB — BASIC METABOLIC PANEL
Anion gap: 10 (ref 5–15)
BUN: 46 mg/dL — ABNORMAL HIGH (ref 8–23)
CO2: 20 mmol/L — ABNORMAL LOW (ref 22–32)
Calcium: 9.4 mg/dL (ref 8.9–10.3)
Chloride: 105 mmol/L (ref 98–111)
Creatinine, Ser: 1.37 mg/dL — ABNORMAL HIGH (ref 0.61–1.24)
GFR, Estimated: 53 mL/min — ABNORMAL LOW (ref >60)
Glucose, Bld: 234 mg/dL — ABNORMAL HIGH (ref 70–99)
Potassium: 3.4 mmol/L — ABNORMAL LOW (ref 3.5–5.1)
Sodium: 135 mmol/L (ref 135–145)

## 2020-10-21 LAB — CK: Total CK: 16 U/L — ABNORMAL LOW (ref 49–397)

## 2020-10-21 LAB — GLUCOSE, CAPILLARY
Glucose-Capillary: 218 mg/dL — ABNORMAL HIGH (ref 70–99)
Glucose-Capillary: 299 mg/dL — ABNORMAL HIGH (ref 70–99)
Glucose-Capillary: 234 mg/dL — ABNORMAL HIGH (ref 70–99)
Glucose-Capillary: 258 mg/dL — ABNORMAL HIGH (ref 70–99)

## 2020-10-21 LAB — URINE CULTURE: Culture: >=100000 — AB

## 2020-10-21 LAB — SURGICAL PATHOLOGY

## 2020-10-21 LAB — HEMOGLOBIN AND HEMATOCRIT, BLOOD
HCT: 30.2 % — ABNORMAL LOW (ref 39.0–52.0)
Hemoglobin: 9.8 g/dL — ABNORMAL LOW (ref 13.0–17.0)

## 2020-10-21 LAB — MAGNESIUM: Magnesium: 2.1 mg/dL (ref 1.7–2.4)

## 2020-10-21 MED ORDER — SODIUM CHLORIDE 0.9 % IV SOLN
1.0000 g | Freq: Once | INTRAVENOUS | Status: AC
  Administered 2020-10-21: 1 g via INTRAVENOUS
  Filled 2020-10-21: qty 1

## 2020-10-21 MED ORDER — POTASSIUM CHLORIDE CRYS ER 20 MEQ PO TBCR
40.0000 meq | EXTENDED_RELEASE_TABLET | Freq: Once | ORAL | Status: AC
  Administered 2020-10-21: 40 meq via ORAL
  Filled 2020-10-21: qty 2

## 2020-10-21 MED ORDER — SODIUM CHLORIDE 0.9 % IV SOLN
1.0000 g | Freq: Once | INTRAVENOUS | Status: DC
  Filled 2020-10-21: qty 1

## 2020-10-21 NOTE — TOC Initial Note (Signed)
Transition of Care [facility]) - Initial/Assessment Note    Patient Details  Name: Timothy Neal MRN: [id number] Date of Birth: 23-Apr-1941  Transition of [facility]) CM/SW Contact:    Lynnell Catalan, RN Phone Number: 10/21/2020, 3:07 PM  Clinical Narrative:                 Was asked by pt and family to communicate with nephew Marky Buresh [phone] for dc planning. Spoke with Lennette Bihari via phone who states that pt is from [facility] and now would not be able to go back there as he needs a higher level of care. FL2 faxed out to area facilities for SNF bed availability. TOC will continue to follow and assist with DC plan.  Expected Discharge Plan: Skilled Nursing Facility Barriers to Discharge: Continued Medical Work up   Patient Goals and CMS Choice        Expected Discharge Plan and Services Expected Discharge Plan: [place]   Discharge Planning Services: CM Consult   Living arrangements for the past 2 months: [place]                                      Prior Living Arrangements/Services Living arrangements for the past 2 months: [facility] Lives with:: Self Patient language and need for interpreter reviewed:: Yes        Need for Family Participation in Patient Care: Yes (Comment) Care giver support system in place?: Yes (comment)   Criminal Activity/Legal Involvement Pertinent to Current Situation/Hospitalization: No - Comment as needed  Activities of Daily Living Home Assistive Devices/Equipment: Cane (specify quad or straight),Eyeglasses,Hearing aid,Wheelchair,Walker (specify type),Grab bars around toilet,Grab bars in shower,Other (Comment),CBG Meter (4 wheeled walker for short distances, bilateral hearing aids, self cath kits) ADL Screening (condition at time of admission) Patient's cognitive ability adequate to safely complete daily activities?: No (patient very sleepy at this time) Is  the patient deaf or have difficulty hearing?: Yes (wears bilateral hearing aids) Does the patient have difficulty seeing, even when wearing glasses/contacts?: No Does the patient have difficulty concentrating, remembering, or making decisions?: Yes Patient able to express need for assistance with ADLs?: Yes Does the patient have difficulty dressing or bathing?: Yes Independently performs ADLs?: No Communication: Independent Dressing (OT): Needs assistance Is this a change from baseline?: Pre-admission baseline Grooming: Needs assistance Is this a change from baseline?: Pre-admission baseline Feeding: Needs assistance Is this a change from baseline?: Pre-admission baseline Bathing: Needs assistance Is this a change from baseline?: Pre-admission baseline Toileting: Needs assistance Is this a change from baseline?: Pre-admission baseline In/Out Bed: Needs assistance Is this a change from baseline?: Pre-admission baseline Walks in Home: Dependent Is this a change from baseline?: Pre-admission baseline Does the patient have difficulty walking or climbing stairs?: Yes Weakness of Legs: Both Weakness of Arms/Hands: None  Permission Sought/Granted                  Emotional Assessment       Orientation: : Oriented to Self,Oriented to Place,Oriented to  Time,Oriented to Situation      Admission diagnosis:  GIB (gastrointestinal bleeding) [K92.2] Generalized weakness [R53.1] Acute pain of right shoulder [M25.511] Symptomatic anemia [D64.9] Patient Active Problem List   Diagnosis Date Noted  . Acute pain of right shoulder   . Generalized weakness   . Multiple gastric ulcers   . Hiatal hernia   .  GIB (gastrointestinal bleeding) 10/25/2020  . Symptomatic anemia   . Heme positive stool   . Hemangioma 09/23/2020  . Spinal cord compression ([place]) 09/19/2020  . CKD (chronic kidney disease), stage IV ([place]) 09/19/2020  . Controlled type 2 diabetes mellitus with hyperglycemia  ([place]) 09/19/2020  . HLD (hyperlipidemia) 09/19/2020  . Hypothyroidism 09/19/2020  . Chronic kidney disease, stage 3 unspecified ([place]) 04/20/2016  . Coronary artery disease 04/20/2016  . Dyslipidemia 04/20/2016  . History of gout 04/20/2016  . Obstructive sleep apnea (adult) (pediatric) 04/20/2016  . Paroxysmal atrial fibrillation ([place]) 04/20/2016  . Chronic systolic heart failure ([place]) 09/23/2014  . 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 Cardiomyopathy ([place]) 08/26/2014  . Diabetes mellitus, type II ([place]) 08/26/2014  . S/P CABG (coronary artery bypass graft) 08/26/2014   PCP:  [name], MD Pharmacy:   [facility], [place] [address] Phone: [phone] Fax: [phone]     Social Determinants of Health (SDOH) Interventions    Readmission Risk Interventions No flowsheet data found.

## 2020-10-21 NOTE — Progress Notes (Signed)
Patient ID: Timothy Neal, male   DOB: 1940/08/02, 80 y.o.   MRN: [id number]    Progress Note   Subjective   Day # 2 CC; GI  Bleeding, thoracic spine hemangioma undergoing radiation EGD -large hiatal hernia, some Cameron erosions, 2 superficial Cameron ulcerations, there were about 8 nonbleeding cratered linear superficial gastric ulcers, largest was 6 mm-biopsies done to rule out H. Pylori  HGB 7.8 drifted  Eliquis on hold [facility] + MRSA   MRI thoracic and lumbar spine pending TEE pending  Patient's wife present in the room this morning, patient is eating and asking for solid food, no complaints of abdominal pain.  No bowel movement or melena today they have multiple questions about upcoming studies   Objective   Vital signs in last 24 hours: Temp:  [97.5 F (36.4 C)-97.9 F (36.6 C)] 97.5 F (36.4 C) (03/23 6720) Pulse Rate:  [71-89] 89 (03/23 0614) Resp:  [12-23] 16 (03/23 0614) BP: (119-155)/(52-90) 143/70 (03/23 0614) SpO2:  [97 %-100 %] 98 % (03/23 0614) Last BM Date: 10/24/2020 General:    Elderly white male in NAD Heart:  Regular rate and rhythm; no murmurs Lungs: Respirations even and unlabored, lungs CTA bilaterally Abdomen:  Soft, nontender and nondistended. Normal bowel sounds. Extremities:  Without edema. Neurologic:  Alert and oriented,   Psych:  Cooperative. Normal mood and affect.  Intake/Output from previous day: 03/22 0701 - 03/23 0700 In: 853 [P.O.:240; I.V.:500; IV Piggyback:113] Out: 1350 [Urine:1350] Intake/Output this shift: No intake/output data recorded.  Lab Results: Recent Labs    10/13/2020 1420 10/19/2020 1921 10/21/20 0238  WBC 7.1 7.0 8.4  HGB 8.8* 9.4* 7.8*  HCT 27.2* 29.0* 24.0*  PLT 125* 124* 145*   BMET Recent Labs    10/14/2020 1013 10/27/2020 2352 10/21/20 0238  NA 134* 132* 135  K 3.6 3.5 3.4*  CL 100 99 105  CO2 27 23 20*  GLUCOSE 121* 104* 234*  BUN 40* 39* 46*  CREATININE 1.38* 1.29* 1.37*  CALCIUM 9.4 9.2 9.4    LFT Recent Labs    10/21/2020 2352  PROT 4.9*  ALBUMIN 1.7*  AST 32  ALT 69*  ALKPHOS 44  BILITOT 1.0   PT/INR Recent Labs    10/25/2020 1013  LABPROT 24.1*  INR 2.2*    Studies/Results: DG Shoulder Right  Result Date: 10/08/2020 CLINICAL DATA:  Right shoulder pain. EXAM: RIGHT SHOULDER - 2+ VIEW COMPARISON:  None. FINDINGS: There is no evidence of fracture or dislocation. There is no evidence of arthropathy or other focal bone abnormality. Soft tissues are unremarkable. IMPRESSION: Negative. Electronically Signed   By: Marijo Conception M.D.   On: 10/27/2020 10:52   DG Chest Port 1 View  Result Date: 10/29/2020 CLINICAL DATA:  Sepsis. EXAM: PORTABLE CHEST 1 VIEW COMPARISON:  September 21, 2020. FINDINGS: Stable cardiomegaly. Status post coronary bypass graft. Left-sided pacemaker is unchanged in position. No pneumothorax or pleural effusion is noted. Lungs are clear. Bony thorax is unremarkable. IMPRESSION: No active disease. Electronically Signed   By: Marijo Conception M.D.   On: 10/25/2020 10:51       Assessment / Plan:    #63 80 year old white male with thoracic spine hemangioma, undergoing radiation therapy, admitted with increasing weakness, anemia and dark stools  EGD yesterday with finding of a very large hiatal hernia with Lysbeth Galas erosions as well as about 8 gastric ulcers, none actively bleeding. Biopsies taken to rule out H. Pylori.  Hemoglobin has drifted about half  a gram since yesterday  #2 atrial fibrillation, on Eliquis prior to admission #3 adult onset diabetes mellitus 4.  Coronary artery disease status post CABG #5 new diagnosis of MRSA bacteremia-question secondary to wound on the left hand  Plan; Would leave on IV PPI twice daily for another 24 hours then can convert to oral Protonix 40 mg p.o. twice daily x3 months then will need to stay on indefinitely once daily  Advance to regular diet, carb modified Continue to trend hemoglobin and transfuse for  hemoglobin less than 7.5 Follow-up on gastric biopsies    Active Problems:   GIB (gastrointestinal bleeding)   Symptomatic anemia   Heme positive stool   Multiple gastric ulcers   Hiatal hernia     LOS: 0 days   Shaeleigh Graw PA-C 10/21/2020, 8:39 AM

## 2020-10-21 NOTE — NC FL2 (Addendum)
[place] LEVEL OF CARE SCREENING TOOL     IDENTIFICATION  Patient Name: Timothy Neal Birthdate: [birth year] Sex: male Admission Date (Current Location): 10/17/2020  [facility] and [place] Number:  Herbalist and Address:  [facility],  [address], [place]      Provider Number: [id number]  Attending Physician Name and Address:  Georgette Shell, MD  Relative Name and Phone Number:       Current Level of Care: Hospital Recommended Level of Care: [place] Prior Approval Number:    Date Approved/Denied:   PASRR Number:  [id number]  Discharge Plan: SNF    Current Diagnoses: Patient Active Problem List   Diagnosis Date Noted  . Acute pain of right shoulder   . Generalized weakness   . Multiple gastric ulcers   . Hiatal hernia   . GIB (gastrointestinal bleeding) 10/08/2020  . Symptomatic anemia   . Heme positive stool   . Hemangioma 09/23/2020  . Spinal cord compression ([place]) 09/19/2020  . CKD (chronic kidney disease), stage IV ([place]) 09/19/2020  . Controlled type 2 diabetes mellitus with hyperglycemia ([place]) 09/19/2020  . HLD (hyperlipidemia) 09/19/2020  . Hypothyroidism 09/19/2020  . Chronic kidney disease, stage 3 unspecified ([place]) 04/20/2016  . Coronary artery disease 04/20/2016  . Dyslipidemia 04/20/2016  . History of gout 04/20/2016  . Obstructive sleep apnea (adult) (pediatric) 04/20/2016  . Paroxysmal atrial fibrillation ([place]) 04/20/2016  . Chronic systolic heart failure ([place]) 09/23/2014  . 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 Cardiomyopathy ([place]) 08/26/2014  . Diabetes mellitus, type II ([place]) 08/26/2014  . S/P CABG (coronary artery bypass graft) 08/26/2014    Orientation RESPIRATION BLADDER Height & Weight     Self,Time,Situation,Place  Normal Incontinent Weight: 72 kg Height:  5\' 7"  (170.2 cm)  BEHAVIORAL SYMPTOMS/MOOD NEUROLOGICAL BOWEL NUTRITION STATUS       Incontinent Diet (Carb modified)  AMBULATORY STATUS COMMUNICATION OF NEEDS Skin   Extensive Assist Verbally Skin abrasions,Other (Comment) (See note)                       Personal Care Assistance Level of Assistance  Bathing,Feeding,Dressing Bathing Assistance: Maximum assistance Feeding assistance: Limited assistance Dressing Assistance: Limited assistance     Functional Limitations Info  Sight,Hearing Sight Info: Impaired Hearing Info: Impaired      SPECIAL CARE FACTORS FREQUENCY  PT (By licensed PT),OT (By licensed OT)     PT Frequency: 5 x weekly OT Frequency: 5 x weekly            Contractures Contractures Info: Not present    Additional Factors Info  Code Status,Allergies Code Status Info: Partial Allergies Info: No known drug allergies           Current Medications (10/21/2020):  This is the current hospital active medication list Current Facility-Administered Medications  Medication Dose Route Frequency Provider Last Rate Last Admin  . acetaminophen (TYLENOL) tablet 650 mg  650 mg Oral Q6H PRN Marylyn Ishihara, Tyrone A, DO       Or  . acetaminophen (TYLENOL) suppository 650 mg  650 mg Rectal Q6H PRN Marylyn Ishihara, Tyrone A, DO      . carvedilol (COREG) tablet 12.5 mg  12.5 mg Oral BID Marylyn Ishihara, Tyrone A, DO   12.5 mg at 10/21/20 0943  . ceFEPIme (MAXIPIME) 1 g in sodium chloride 0.9 % 100 mL IVPB  1 g Intravenous Once Susa Raring, RPH      .  collagenase (SANTYL) ointment   Topical Daily Alma Friendly, MD   Given at 10/21/20 [phone]  . DAPTOmycin (CUBICIN) 650 mg in sodium chloride 0.9 % IVPB  650 mg Intravenous Q2000 Carlyle Basques, MD 226 mL/hr at 10/25/2020 1833 650 mg at 10/01/2020 1833  . dexamethasone (DECADRON) tablet 12 mg  12 mg Oral Q8H Kyle, Tyrone A, DO   12 mg at 10/21/20 1414  . digoxin (LANOXIN) tablet 0.125 mg  0.125 mg Oral Daily Kyle, Tyrone A, DO   0.125 mg at 10/21/20 0943  . ferrous sulfate tablet 325 mg  325 mg Oral Q breakfast Alma Friendly, MD   325 mg at 10/21/20 2831  . finasteride (PROSCAR) tablet 5 mg  5 mg Oral QHS Kyle, Tyrone A, DO   5 mg at 10/29/2020 2129  . fluconazole (DIFLUCAN) tablet 100 mg  100 mg Oral Daily Alma Friendly, MD   100 mg at 10/21/20 0943  . Gerhardt's butt cream   Topical TID Alma Friendly, MD   Given at 10/21/20 [phone]  . insulin aspart (novoLOG) injection 0-15 Units  0-15 Units Subcutaneous TID WC Kyle, Tyrone A, DO   8 Units at 10/21/20 1414  . insulin aspart (novoLOG) injection 0-5 Units  0-5 Units Subcutaneous QHS Kyle, Tyrone A, DO   3 Units at 10/25/2020 2210  . ondansetron (ZOFRAN) tablet 4 mg  4 mg Oral Q6H PRN Marylyn Ishihara, Tyrone A, DO       Or  . ondansetron (ZOFRAN) injection 4 mg  4 mg Intravenous Q6H PRN Marylyn Ishihara, Tyrone A, DO      . pantoprazole (PROTONIX) injection 40 mg  40 mg Intravenous Q12H Yetta Flock, MD   40 mg at 10/21/20 0939  . sotalol (BETAPACE) tablet 80 mg  80 mg Oral Daily Kyle, Tyrone A, DO   80 mg at 10/21/20 1145   And  . sotalol (BETAPACE) tablet 120 mg  120 mg Oral QHS Kyle, Tyrone A, DO   120 mg at 10/10/2020 2129  . tamsulosin (FLOMAX) capsule 0.4 mg  0.4 mg Oral Daily Kyle, Tyrone A, DO   0.4 mg at 10/21/20 1607     Discharge Medications: Please see discharge summary for a list of discharge medications.  Relevant Imaging Results:  Relevant Lab Results:   Additional Information ss# [id number]    Left IT:  3cm x 4cm with depth obscured by the presence of black eschar. No drainage  Left hand, first digit:  2cm  X0.8cm blood filled blister.  Right buttock with 5 lesions within a 6cm x 6cm area that are scabbed and dry.  Scrotum and medial thighs: erythematous with linear excoriations in various stages of healing: full and partial thickness.  Bilateral LEs with ruptured blisters draining serous exudate in a small to moderate amount.  Bridge of nose:  1cm x 1.5cm area where dermatology has removed a neoplastic lesion  Wound bed:As noted above   Drainage (amount, consistency, odor) As noted above  Periwound: intact  Dressing procedure/placement/frequency: I will add a mattress replacement with low air loss feature, and bilateral heel pressure redistribution heel boots for Pressure injury treatment and prevention.  Topical care to the bilateral LEs will be with xeroform gauze, abd pads and secured with Kerlix with daily changes. The traumatic skin injury to the left hand (a blood-filled blister measuring 2cm x 0.8cm), first digit (injured while clipping nails) will be painted with betadine until it dries, turns to eschar and autolytically debrides.  The buttocks present with numerous circular skin lesions non inconsistent with both IAD and herpetic lesions. There is a circular area of necrosis at the left ischial tuberosity.   Gerhart's butt cream (a compounded 1:1:1 cream of hydrocortisone:lotrumin:zinc oxide) is provided for the buttock, scrotal and medial thigh lesions.  Collagenase (Santyl) is applied to the Unstageable PI.  CLEMENTS, Marjie Skiff, RN

## 2020-10-21 NOTE — Evaluation (Signed)
Physical Therapy Evaluation Patient Details Name: Timothy Neal MRN: [id number] DOB: Apr 17, 1941 Today's Date: 10/21/2020   History of Present Illness  80 y.o. 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 Presenting with increased weakness over the last week with anemia as well as dark stools. patient has not been able to ambulate recently.  Clinical Impression  The  Patient is very weak, Appears with L.RLE weakness, not strong enough to stand at this time. Patient  Also has developed LE lesions, bleeding and weeping. Patient also has painful buttock wounds. Patient has been essentailly bed bound.  Unsure  Patient will tolerate PT for OOB activities. Concern for  Sitting with wounds. Pt admitted with above diagnosis. Pt currently with functional limitations due to the deficits listed below (see PT Problem List). Pt will benefit from skilled PT to increase their independence and safety with mobility to allow discharge to the venue listed below.    During sitting activities, patient's legs bleeding in spots. Patient reports severe pain in right shoulder.    Follow Up Recommendations SNF (will need 24/7 / unsure ifwill tolerate PT at SNF.)    Equipment Recommendations  None recommended by PT    Recommendations for Other Services       Precautions / Restrictions Precautions Precautions: Fall Precaution Comments: LE blisters,RIT wound and and other buttock sores, L>R leg weakness. Restrictions Weight Bearing Restrictions: No      Mobility  Bed Mobility Overal bed mobility: Needs Assistance Bed Mobility: Rolling Rolling: Max assist;+2 for physical assistance;+2 for safety/equipment   Supine to sit: Max assist;+2 for physical assistance;+2 for safety/equipment;HOB elevated;Total assist Sit to supine: Total assist;+2 for physical assistance;+2 for safety/equipment   General bed mobility comments:  significant weakness, attempt to have patient assist with UEs on bed rail however R shoulder with significant pain. able to minimally move L LE toward EOB    Transfers                 General transfer comment: unable , will need mechanical lift  Ambulation/Gait                Stairs            Wheelchair Mobility    Modified Rankin (Stroke Patients Only)       Balance Overall balance assessment: Needs assistance Sitting-balance support: Feet supported;Bilateral upper extremity supported Sitting balance-Leahy Scale: Poor Sitting balance - Comments: needing mod to maintain static sitting                                     Pertinent Vitals/Pain Pain Assessment: Faces Faces Pain Scale: Hurts even more Pain Location: R shoulder, Pain Descriptors / Indicators: Aching;Grimacing;Guarding Pain Intervention(s): Monitored during session    Home Living Family/patient expects to be discharged to:: Other (Comment) ([facility])                 Additional Comments: Reports independence with ADLs. Has been using wheelchair the last two months. Gradual weakness of LEs since November.Recently bedbound    Prior Function Level of Independence: Needs assistance   Gait / Transfers Assistance Needed: per sister patient was receiving PT daily, last time he walked with PT was 3/14 about 46ft.  Patient was able to transfer himself from wheelchair to toilet, bed ect up until ~  3/19  ADL's / Homemaking Assistance Needed: sister reports had CNA coming in for ~1.5 hrs in AM/PM to assist with dressing and bathing  Comments: Pt works ~3x/wk with PT at [employer]. He also goes to the gym there and uses the nustep. He does exercies that work endurance and balance for LE's.     Hand Dominance   Dominant Hand: Right    Extremity/Trunk Assessment   Upper Extremity Assessment Upper Extremity Assessment: Defer to OT evaluation RUE Deficits / Details: patient  minimally able to flex R elbow due to significant R shoulder pain, guarding any attempt to move the right shoulder. RUE: Unable to fully assess due to pain    Lower Extremity Assessment Lower Extremity Assessment: RLE deficits/detail;LLE deficits/detail RLE Deficits / Details: grossly 3/5 dorsiflexion and hip flex and knee extension LLE Deficits / Details: grossly 2/5 ankle knee and hip    Cervical / Trunk Assessment Cervical / Trunk Assessment: Kyphotic  Communication   Communication: No difficulties  Cognition Arousal/Alertness: Awake/alert Behavior During Therapy: WFL for tasks assessed/performed Overall Cognitive Status: Within Functional Limits for tasks assessed                                 General Comments: following directions appropriately      General Comments      Exercises     Assessment/Plan    PT Assessment Patient needs continued PT services  PT Problem List Decreased strength;Decreased mobility;Decreased safety awareness       PT Treatment Interventions Therapeutic activities;Functional mobility training;Therapeutic exercise;Patient/family education;Balance training    PT Goals (Current goals can be found in the Care Plan section)  Acute Rehab PT Goals Patient Stated Goal: less pain, to get up again PT Goal Formulation: With patient/family Time For Goal Achievement: 11/04/20 Potential to Achieve Goals: Poor    Frequency Min 2X/week   Barriers to discharge Decreased caregiver support      Co-evaluation PT/OT/SLP Co-Evaluation/Treatment: Yes Reason for Co-Treatment: Complexity of the patient's impairments (multi-system involvement);For patient/therapist safety PT goals addressed during session: Mobility/safety with mobility OT goals addressed during session: ADL's and self-care       AM-PAC PT "6 Clicks" Mobility  Outcome Measure Help needed turning from your back to your side while in a flat bed without using bedrails?:  Total Help needed moving from lying on your back to sitting on the side of a flat bed without using bedrails?: Total Help needed moving to and from a bed to a chair (including a wheelchair)?: Total Help needed standing up from a chair using your arms (e.g., wheelchair or bedside chair)?: Total Help needed to walk in hospital room?: Total Help needed climbing 3-5 steps with a railing? : Total 6 Click Score: 6    End of Session   Activity Tolerance: Patient limited by fatigue Patient left: in bed;with bed alarm set;with call bell/phone within reach;with family/visitor present Nurse Communication: Mobility status;Need for lift equipment PT Visit Diagnosis: Muscle weakness (generalized) (M62.81)    Time: 2595-6387 PT Time Calculation (min) (ACUTE ONLY): 30 min   Charges:   PT Evaluation $PT Eval Low Complexity: [place] PT Acute Rehabilitation Services Pager [phone] Office [phone]   Claretha Cooper 10/21/2020, 12:56 PM

## 2020-10-21 NOTE — Evaluation (Signed)
Occupational Therapy Evaluation Patient Details Name: Timothy Neal MRN: [id number] DOB: [birth year] Today's Date: 10/21/2020    History of Present Illness 80 y.o. 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 Presenting with increased weakness over the last week with anemia as well as dark stools.   Clinical Impression   Patient lives at independent living facility [facility]. Sister reports have CNA coming in 1.5hr AM/PM to help patient in/out of bed, with dressing and bathing. Patient last walked with PT on 3/14 ~77ft per sister, and was able to transfer himself from w/c to toilet, bed up until 3/18 then became significantly weaker and has been bed bound for past week before admission to hospital. Currently patient with global weakness, significant R shoulder pain limiting proximal and distal ROM. Patient needed max A x2 to sit up on EOB, tolerated ~3 minutes before having to return to supine with total A x2. Recommend continued acute OT services to maximize patient strength, endurance, balance in order to reduce caregiver burden and facilitate D/C to venue listed below. Patient's sister reports interested in patient transitioning to skilled nursing facility "there isn't enough help available to him at an ILF or ALF"    Follow Up Recommendations  SNF    Equipment Recommendations  None recommended by OT       Precautions / Restrictions Precautions Precautions: Fall Precaution Comments: LE blisters Restrictions Weight Bearing Restrictions: No      Mobility Bed Mobility Overal bed mobility: Needs Assistance Bed Mobility: Supine to Sit;Sit to Supine     Supine to sit: Max assist;+2 for physical assistance;+2 for safety/equipment;HOB elevated Sit to supine: Total assist;+2 for physical assistance;+2 for safety/equipment   General bed mobility comments: significant weakness, attempt to have  patient assist with UEs on bed rail however R shoulder with significant pain. able to minimally move L LE toward EOB    Transfers                 General transfer comment: deferred    Balance Overall balance assessment: Needs assistance Sitting-balance support: Feet supported;Bilateral upper extremity supported Sitting balance-Leahy Scale: Poor Sitting balance - Comments: needing mod to maintain static sitting                                   ADL either performed or assessed with clinical judgement   ADL Overall ADL's : Needs assistance/impaired Eating/Feeding: Set up;Bed level   Grooming: Minimal assistance;Bed level   Upper Body Bathing: Moderate assistance;Bed level   Lower Body Bathing: Total assistance;Bed level   Upper Body Dressing : Maximal assistance;Bed level   Lower Body Dressing: Total assistance;Bed level     Toilet Transfer Details (indicate cue type and reason): unable, max A x2 for bed mobility Toileting- Clothing Manipulation and Hygiene: Total assistance;Bed level       Functional mobility during ADLs: Maximal assistance;+2 for physical assistance;+2 for safety/equipment General ADL Comments: patient with significant weakness and global pain including R shoulder needing increased assistance for all ADLs                  Pertinent Vitals/Pain Pain Assessment: Faces Faces Pain Scale: Hurts even more Pain Location: R shoulder, Pain Descriptors / Indicators: Aching;Grimacing;Guarding Pain Intervention(s): Monitored during session     Hand Dominance Right   Extremity/Trunk Assessment Upper Extremity  Assessment Upper Extremity Assessment: Generalized weakness;RUE deficits/detail RUE Deficits / Details: patient minimally able to flex R elbow due to significant R shoulder pain, guarding RUE: Unable to fully assess due to pain   Lower Extremity Assessment Lower Extremity Assessment: Defer to PT evaluation        Communication Communication Communication: No difficulties   Cognition Arousal/Alertness: Awake/alert Behavior During Therapy: WFL for tasks assessed/performed Overall Cognitive Status: Within Functional Limits for tasks assessed                                 General Comments: following directions appropriately              Home Living Family/patient expects to be discharged to:: Other (Comment) ([facility])                                        Prior Functioning/Environment Level of Independence: Needs assistance  Gait / Transfers Assistance Needed: per sister patient was receiving PT daily, last time he walked with PT was 3/14 about 8ft.  Patient was able to transfer himself from wheelchair to toilet, bed ect up until ~3/19 ADL's / Homemaking Assistance Needed: sister reports had CNA coming in for ~1.5 hrs in AM/PM to assist with dressing and bathing            OT Problem List: Decreased strength;Decreased range of motion;Decreased activity tolerance;Impaired balance (sitting and/or standing);Decreased safety awareness;Pain;Impaired UE functional use      OT Treatment/Interventions: Self-care/ADL training;Therapeutic exercise;Therapeutic activities;Patient/family education;Balance training    OT Goals(Current goals can be found in the care plan section) Acute Rehab OT Goals Patient Stated Goal: less pain OT Goal Formulation: With patient/family Time For Goal Achievement: 11/04/20 Potential to Achieve Goals: Fair  OT Frequency: Min 2X/week           Co-evaluation PT/OT/SLP Co-Evaluation/Treatment: Yes Reason for Co-Treatment: To address functional/ADL transfers;For patient/therapist safety;Complexity of the patient's impairments (multi-system involvement)   OT goals addressed during session: ADL's and self-care      AM-PAC OT "6 Clicks" Daily Activity     Outcome Measure Help from another person eating meals?: A  Little Help from another person taking care of personal grooming?: A Little Help from another person toileting, which includes using toliet, bedpan, or urinal?: Total Help from another person bathing (including washing, rinsing, drying)?: A Lot Help from another person to put on and taking off regular upper body clothing?: A Lot Help from another person to put on and taking off regular lower body clothing?: Total 6 Click Score: 12   End of Session Nurse Communication: Mobility status  Activity Tolerance: Patient limited by pain Patient left: in bed;with call bell/phone within reach;with bed alarm set;with family/visitor present  OT Visit Diagnosis: Other abnormalities of gait and mobility (R26.89);Muscle weakness (generalized) (M62.81);History of falling (Z91.81);Pain Pain - Right/Left: Right Pain - part of body: Shoulder                Time: 6967-8938 OT Time Calculation (min): 26 min Charges:  OT General Charges $OT Visit: 1 Visit OT Evaluation $OT Eval Low Complexity: [place] OT OT pager: [phone]  Rosemary Holms 10/21/2020, 12:25 PM

## 2020-10-21 NOTE — Progress Notes (Addendum)
ID PROGRESS NOTE  S: afebrile, underwent right shoulder CT as well as TTE ROS: back pain, discomfort, dry mouth, left hand pain, 12 point ros is otherwise negative  BP (!) 162/72   Pulse 70   Temp 98.2 F (36.8 C) (Oral)   Resp 18   Ht 5\' 7"  (1.702 m)   Wt 72 kg   SpO2 93%   BMI 24.86 kg/m  Physical Exam  Constitutional: He is oriented to person, place, and time. He appears chronically ill  and undernourished. No distress.  HENT:  Mouth/Throat: Oropharynx is clear and moist. No oropharyngeal exudate.  Cardiovascular: Normal rate, regular rhythm and normal heart sounds. Exam reveals no gallop and no friction rub.  No murmur heard.  Pulmonary/Chest: Effort normal and breath sounds normal. No respiratory distress. He has no wheezes.  Abdominal: Soft. Bowel sounds are normal. He exhibits no distension. There is no tenderness.  MBB:UYZJQ had swelling to lateral dorsal aspect of hand +flunctuant and tender Neurological: He is alert and oriented to person, place, and time. LE paralysis Skin: Skin is warm and dry. No rash noted. No erythema.  Psychiatric: He has a normal mood and affect. His behavior is normal.   Labs and TTE reviewed  A/P:  kleb uti = will give dose of cefepime will finish 3 day course of treatment  mrsa bacteremia = will continue on current regimen with daptomycin. Await to get results of TTE/TEE to decide if needs rifampin. Has drug interactions with current medications so we will need to adjust meds if rifamycins are started.  Esophageal candidiasis = continue with fluc dosed per renal function  Sacral wound = continue with wound care recs/santyl for debridement

## 2020-10-21 NOTE — Progress Notes (Signed)
  Echocardiogram 2D Echocardiogram has been performed.  Timothy Neal 10/21/2020, 2:09 PM

## 2020-10-21 NOTE — Progress Notes (Signed)
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 Presenting with increased weakness over the last week with anemia as well as dark stools. 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 He normally is able to use a walker to move at least 15 yards per his sister at bedside. Over the last week, he's had increasing overall weakness and lethargy; as well as an acute inability to use his walker or move on his own. She notes that his sacral wound seems worse and may have had some subjective fever. He c/o on shoulder pain, but she notes that he had been putting extra strain on that right shoulder. She believes that he may have injured it during those exercises.  Of note, patient came from [facility].  In the ED, pt was found to have a 3.5 pt drop in his Hgb and he was FOBT positive. GI was consulted. There was concern for worsening BLE weakness. Neurosurgery was consulted. L hand cellulitis was noted. Blood cx noted to grow MRSA. Patient was started on vanc. [facility] was called for admission.   Assessment & Plan:   Active Problems:   GIB (gastrointestinal bleeding)   Symptomatic anemia   Heme positive stool   Multiple gastric ulcers   Hiatal hernia  #1 MRSA bacteremia/Klebsiella UTI -seen by ID started on daptomycin.  Transthoracic echo ordered. Transesophageal echo possibly on Friday. Received cefepime for uti  #2 sacral wound present on admission-care.  Patient has numerous skin lesions to buttocks, unstageable pressure injury to the left ilial tuberosity, weeping areas of partial skin loss to the bilateral lower extremities erythema and skin loss right  buttock scrotum and medial thighs, trauma to the left hand and first digit. Wound care notes reviewed. Continue low air loss mattress, bilateral heel pressure redistribution heel boots for pressure related injury and prevention.  Topical care to bilateral lower extremities with Xeroform gauze ABD pad secured with Kerlix with daily changes. Blood-filled blister in the left hand and first digit painted with Betadine until it dries  Cherrelle's Butt cream to the butt lesions. Santyl to unstageable pressure injury.  #3 esophageal candidiasis-on fluconazole, will DC statin due to increased risk of rhabdomyolysis.  #4 upper GI bleed -likely secondary to [facility] lesions in the setting of Eliquis use.  Status post EGD with [place] lesions, esophageal plaques consistent with esophageal candidiasis.  10 nonbleeding gastric ulcers with no stigmata of bleeding.  To [place] lesions within the hernia sac 18 gastric body and fundus.  H. pylori testing was done pending.  Continue Protonix 40 mg twice a day. No NSAIDs, restart Eliquis when okay with GI. Hemoglobin 7.8 down from 9.4 yesterday.  #5 spinal hemangioma-patient getting radiation as he is a very poor surgical candidate. repeat thoracic MRI ordered Seen by neurosurgery.  #6 protein calorie malnutrition consult dietary.  #7 hypokalemia potassium 3.4 repleted.  Check mag level 2.1.   Estimated body mass index is 24.86 kg/m as calculated from the following:   Height as of this encounter: 5\' 7"  (1.702 m).   Weight as of this encounter: 72 kg.  DVT prophylaxis: none GI bleed Code Status: Partial code  family Communication: Try to reach Sister  Natividad Halls was not able to answer the phone.    disposition Plan:  Status is: Inpatient   Dispo: The patient is from: assisted living              Anticipated d/c is to: SNF              Patient currently is not medically stable to d/c.   Difficult to place patient na   Consultants: GI neurosurgery  infectious disease  Procedures:   Antimicrobials   Subjective:  Patient resting in bed appears very weak and frail Reports he does not know what is going on Complains of severe right shoulder pain  Objective: Vitals:   10/04/2020 1350 10/01/2020 1427 10/21/2020 2151 10/21/20 0614  BP: (!) 155/87 (!) 151/86 140/65 (!) 143/70  Pulse: 80 89 77 89  Resp: (!) 23 16 16 16   Temp:  97.7 F (36.5 C) 97.7 F (36.5 C) (!) 97.5 F (36.4 C)  TempSrc:  Oral Oral Oral  SpO2: 98% 100% 99% 98%  Weight:      Height:        Intake/Output Summary (Last 24 hours) at 10/21/2020 0933 Last data filed at 10/21/2020 0615 Gross per 24 hour  Intake 853 ml  Output 1350 ml  Net -497 ml   Filed Weights   10/16/2020 2100  Weight: 72 kg    Examination:  General exam: Appears calm and comfortable  Respiratory system: Clear to auscultation. Respiratory effort normal. Cardiovascular system: S1 & S2 heard, RRR. No JVD, murmurs, rubs, gallops or clicks. No pedal edema. Gastrointestinal system: Abdomen is nondistended, soft and nontender. No organomegaly or masses felt. Normal bowel sounds heard. Central nervous system: 3 x 5 in both lower extremities extremities: 1+ edema Skin: Multiple excoriated lesions in the lower extremities and multiple decubitus lesions in the sacrum and scrotum and medial thighs.   Psychiatry: Judgement and insight appear normal. Mood & affect appropriate.     Data Reviewed: I have personally reviewed following labs and imaging studies  CBC: Recent Labs  Lab 10/25/2020 1013 10/23/2020 1537 10/11/2020 2352 10/16/2020 1420 10/09/2020 1921 10/21/20 0238  WBC 5.8  --  5.7 7.1 7.0 8.4  NEUTROABS 5.3  --   --   --   --  7.8*  HGB 8.2* 7.8* 8.2* 8.8* 9.4* 7.8*  HCT 24.8* 23.9* 24.5* 27.2* 29.0* 24.0*  MCV 88.3  --  88.1 89.8 90.9 90.6  PLT 106*  --  109* 125* 124* 109*   Basic Metabolic Panel: Recent Labs  Lab 10/18/2020 1013 10/18/2020 2352 10/21/20 0238  NA 134* 132* 135  K 3.6  3.5 3.4*  CL 100 99 105  CO2 27 23 20*  GLUCOSE 121* 104* 234*  BUN 40* 39* 46*  CREATININE 1.38* 1.29* 1.37*  CALCIUM 9.4 9.2 9.4  MG  --   --  2.1   GFR: Estimated Creatinine Clearance: 41.5 mL/min (A) (by C-G formula based on SCr of 1.37 mg/dL (H)). Liver Function Tests: Recent Labs  Lab 10/02/2020 1013 10/27/2020 2352  AST 32 32  ALT 69* 69*  ALKPHOS 43 44  BILITOT 0.9 1.0  PROT 4.9* 4.9*  ALBUMIN 1.7* 1.7*   No results for input(s): LIPASE, AMYLASE in the last 168 hours. No results for input(s): AMMONIA in the last 168 hours. Coagulation Profile: Recent Labs  Lab 09/30/2020 1013  INR 2.2*   Cardiac Enzymes: Recent Labs  Lab 10/21/20 0238  CKTOTAL 16*   BNP (last 3 results)  No results for input(s): PROBNP in the last 8760 hours. HbA1C: No results for input(s): HGBA1C in the last 72 hours. CBG: Recent Labs  Lab 10/18/2020 0822 10/25/2020 1213 09/29/2020 1610 09/30/2020 2153 10/21/20 0750  GLUCAP 94 91 118* 272* 218*   Lipid Profile: No results for input(s): CHOL, HDL, LDLCALC, TRIG, CHOLHDL, LDLDIRECT in the last 72 hours. Thyroid Function Tests: No results for input(s): TSH, T4TOTAL, FREET4, T3FREE, THYROIDAB in the last 72 hours. Anemia Panel: Recent Labs    10/05/2020 1013  VITAMINB12 1,314*  FOLATE 18.6  FERRITIN 809*  TIBC 124*  IRON 18*  RETICCTPCT 1.4   Sepsis Labs: Recent Labs  Lab 10/06/2020 1013 10/23/2020 1213  LATICACIDVEN 1.0 1.1    Recent Results (from the past 240 hour(s))  Blood Culture (routine x 2)     Status: Abnormal (Preliminary result)   Collection Time: 10/25/2020 10:00 AM   Specimen: BLOOD LEFT HAND  Result Value Ref Range Status   Specimen Description   Final    BLOOD LEFT HAND Performed at [facility] [address], [place], [place] [address]    Special Requests   Final    BOTTLES DRAWN AEROBIC AND ANAEROBIC Blood Culture results may not be optimal due to an inadequate volume of blood received in culture  bottles Performed at [facility] [address] [place], [place] [address]    Culture  Setup Time   Final    IN BOTH AEROBIC AND ANAEROBIC BOTTLES GRAM POSITIVE COCCI CRITICAL VALUE NOTED.  VALUE IS CONSISTENT WITH PREVIOUSLY REPORTED AND CALLED VALUE. Performed at [facility], [place] [address], [place], [place] [address]    Culture STAPHYLOCOCCUS AUREUS (A)  Final   Report Status PENDING  Incomplete  Resp Panel by RT-PCR (Flu A&B, Covid) Nasopharyngeal Swab     Status: None   Collection Time: 10/02/2020 10:13 AM   Specimen: Nasopharyngeal Swab; Nasopharyngeal(NP) swabs in vial transport medium  Result Value Ref Range Status   SARS Coronavirus 2 by RT PCR NEGATIVE NEGATIVE Final    Comment: (NOTE) SARS-CoV-2 target nucleic acids are NOT DETECTED.  The SARS-CoV-2 RNA is generally detectable in upper respiratory specimens during the acute phase of infection. The lowest concentration of SARS-CoV-2 viral copies this assay can detect is 138 copies/mL. A negative result does not preclude SARS-Cov-2 infection and should not be used as the sole basis for treatment or other patient management decisions. A negative result may occur with  improper specimen collection/handling, submission of specimen other than nasopharyngeal swab, presence of viral mutation(s) within the areas targeted by this assay, and inadequate number of viral copies(<138 copies/mL). A negative result must be combined with clinical observations, patient history, and epidemiological information. The expected result is Negative.  Fact Sheet for Patients:  EntrepreneurPulse.com.au  Fact Sheet for Healthcare Providers:  IncredibleEmployment.be  This test is no t yet approved or cleared by the [place] FDA and  has been authorized for detection and/or diagnosis of SARS-CoV-2 by FDA under an Emergency Use Authorization (EUA). This EUA will remain  in effect  (meaning this test can be used) for the duration of the COVID-19 declaration under Section 564(b)(1) of the Act, 21 U.S.C.section 360bbb-3(b)(1), unless the authorization is terminated  or revoked sooner.       Influenza A by PCR NEGATIVE NEGATIVE Final   Influenza B by PCR NEGATIVE NEGATIVE Final    Comment: (NOTE) The Xpert Xpress SARS-CoV-2/FLU/RSV plus assay is intended as an  aid in the diagnosis of influenza from Nasopharyngeal swab specimens and should not be used as a sole basis for treatment. Nasal washings and aspirates are unacceptable for Xpert Xpress SARS-CoV-2/FLU/RSV testing.  Fact Sheet for Patients: EntrepreneurPulse.com.au  Fact Sheet for Healthcare Providers: IncredibleEmployment.be  This test is not yet approved or cleared by the [place] FDA and has been authorized for detection and/or diagnosis of SARS-CoV-2 by FDA under an Emergency Use Authorization (EUA). This EUA will remain in effect (meaning this test can be used) for the duration of the COVID-19 declaration under Section 564(b)(1) of the Act, 21 U.S.C. section 360bbb-3(b)(1), unless the authorization is terminated or revoked.  Performed at [facility], [place] [address] [place], [place] [address]   Blood Culture (routine x 2)     Status: Abnormal (Preliminary result)   Collection Time: 10/13/2020 10:15 AM   Specimen: BLOOD  Result Value Ref Range Status   Specimen Description   Final    BLOOD LEFT ANTECUBITAL Performed at [facility] [address], [place], [place] [address]    Special Requests   Final    BOTTLES DRAWN AEROBIC AND ANAEROBIC Blood Culture results may not be optimal due to an inadequate volume of blood received in culture bottles Performed at [facility] [address], [place], [place] [address]    Culture  Setup Time   Final    IN BOTH AEROBIC AND ANAEROBIC BOTTLES GRAM POSITIVE  COCCI CRITICAL RESULT CALLED TO, READ BACK BY AND VERIFIED WITH: E JACKSON PHARMD 10/25/2020 0245 JDW    Culture (A)  Final    STAPHYLOCOCCUS AUREUS SUSCEPTIBILITIES TO FOLLOW Performed at [facility], [place] [address], [place], [place] [address]    Report Status PENDING  Incomplete  Blood Culture ID Panel (Reflexed)     Status: Abnormal   Collection Time: 10/25/2020 10:15 AM  Result Value Ref Range Status   Enterococcus faecalis NOT DETECTED NOT DETECTED Final   Enterococcus Faecium NOT DETECTED NOT DETECTED Final   Listeria monocytogenes NOT DETECTED NOT DETECTED Final   Staphylococcus species DETECTED (A) NOT DETECTED Final    Comment: CRITICAL RESULT CALLED TO, READ BACK BY AND VERIFIED WITH: E JACKSON PHARMD 10/05/2020 0245 JDW    Staphylococcus aureus (BCID) DETECTED (A) NOT DETECTED Final    Comment: Methicillin (oxacillin)-resistant Staphylococcus aureus (MRSA). MRSA is predictably resistant to beta-lactam antibiotics (except ceftaroline). Preferred therapy is vancomycin unless clinically contraindicated. Patient requires contact precautions if  hospitalized. CRITICAL RESULT CALLED TO, READ BACK BY AND VERIFIED WITH: E JACKSON PHARMD 10/21/2020 0245 JDW    Staphylococcus epidermidis NOT DETECTED NOT DETECTED Final   Staphylococcus lugdunensis NOT DETECTED NOT DETECTED Final   Streptococcus species NOT DETECTED NOT DETECTED Final   Streptococcus agalactiae NOT DETECTED NOT DETECTED Final   Streptococcus pneumoniae NOT DETECTED NOT DETECTED Final   Streptococcus pyogenes NOT DETECTED NOT DETECTED Final   A.calcoaceticus-baumannii NOT DETECTED NOT DETECTED Final   Bacteroides fragilis NOT DETECTED NOT DETECTED Final   Enterobacterales NOT DETECTED NOT DETECTED Final   Enterobacter cloacae complex NOT DETECTED NOT DETECTED Final   Escherichia coli NOT DETECTED NOT DETECTED Final   Klebsiella aerogenes NOT DETECTED NOT DETECTED Final   Klebsiella oxytoca NOT DETECTED NOT DETECTED  Final   Klebsiella pneumoniae NOT DETECTED NOT DETECTED Final   Proteus species NOT DETECTED NOT DETECTED Final   Salmonella species NOT DETECTED NOT DETECTED Final   Serratia marcescens NOT DETECTED NOT DETECTED  Final   Haemophilus influenzae NOT DETECTED NOT DETECTED Final   Neisseria meningitidis NOT DETECTED NOT DETECTED Final   Pseudomonas aeruginosa NOT DETECTED NOT DETECTED Final   Stenotrophomonas maltophilia NOT DETECTED NOT DETECTED Final   Candida albicans NOT DETECTED NOT DETECTED Final   Candida auris NOT DETECTED NOT DETECTED Final   Candida glabrata NOT DETECTED NOT DETECTED Final   Candida krusei NOT DETECTED NOT DETECTED Final   Candida parapsilosis NOT DETECTED NOT DETECTED Final   Candida tropicalis NOT DETECTED NOT DETECTED Final   Cryptococcus neoformans/gattii NOT DETECTED NOT DETECTED Final   Meth resistant mecA/C and MREJ DETECTED (A) NOT DETECTED Final    Comment: CRITICAL RESULT CALLED TO, READ BACK BY AND VERIFIED WITHSeleta Rhymes [facility] 10/08/2020 0245 JDW Performed at [facility], [address] [place], [place] [address]   Urine culture     Status: Abnormal   Collection Time: 10/06/2020 11:02 AM   Specimen: In/Out Cath Urine  Result Value Ref Range Status   Specimen Description   Final    IN/OUT CATH URINE Performed at [facility] [address] [place], [place] [address]    Special Requests   Final    NONE Performed at [facility], [place] [address], [place], [place] [address]    Culture >=100,000 COLONIES/mL KLEBSIELLA PNEUMONIAE (A)  Final   Report Status 10/21/2020 FINAL  Final   Organism ID, Bacteria KLEBSIELLA PNEUMONIAE (A)  Final      Susceptibility   Klebsiella pneumoniae - MIC*    AMPICILLIN >=32 RESISTANT Resistant     CEFAZOLIN <=4 SENSITIVE Sensitive     CEFEPIME <=0.12 SENSITIVE Sensitive     CEFTRIAXONE <=0.25 SENSITIVE Sensitive     CIPROFLOXACIN <=0.25 SENSITIVE Sensitive     GENTAMICIN  <=1 SENSITIVE Sensitive     IMIPENEM <=0.25 SENSITIVE Sensitive     NITROFURANTOIN 128 RESISTANT Resistant     TRIMETH/SULFA >=320 RESISTANT Resistant     AMPICILLIN/SULBACTAM >=32 RESISTANT Resistant     PIP/TAZO 32 INTERMEDIATE Intermediate     * >=100,000 COLONIES/mL KLEBSIELLA PNEUMONIAE         Radiology Studies: DG Shoulder Right  Result Date: 10/16/2020 CLINICAL DATA:  Right shoulder pain. EXAM: RIGHT SHOULDER - 2+ VIEW COMPARISON:  None. FINDINGS: There is no evidence of fracture or dislocation. There is no evidence of arthropathy or other focal bone abnormality. Soft tissues are unremarkable. IMPRESSION: Negative. Electronically Signed   By: Marijo Conception M.D.   On: 10/15/2020 10:52   DG Chest Port 1 View  Result Date: 10/17/2020 CLINICAL DATA:  Sepsis. EXAM: PORTABLE CHEST 1 VIEW COMPARISON:  September 21, 2020. FINDINGS: Stable cardiomegaly. Status post coronary bypass graft. Left-sided pacemaker is unchanged in position. No pneumothorax or pleural effusion is noted. Lungs are clear. Bony thorax is unremarkable. IMPRESSION: No active disease. Electronically Signed   By: Marijo Conception M.D.   On: 10/04/2020 10:51        Scheduled Meds: . atorvastatin  40 mg Oral Once per day on Mon Wed Fri  . carvedilol  12.5 mg Oral BID  . collagenase   Topical Daily  . dexamethasone  12 mg Oral Q8H  . digoxin  0.125 mg Oral Daily  . ferrous sulfate  325 mg Oral Q breakfast  . finasteride  5 mg Oral QHS  . fluconazole  100 mg Oral Daily  . Gerhardt's butt cream   Topical TID  . insulin aspart  0-15 Units Subcutaneous TID WC  . insulin aspart  0-5 Units Subcutaneous QHS  . pantoprazole (PROTONIX) IV  40 mg Intravenous Q12H  . potassium chloride  40 mEq Oral Once  . sotalol  80 mg Oral Daily   And  . sotalol  120 mg Oral QHS  . tamsulosin  0.4 mg Oral Daily   Continuous Infusions: . DAPTOmycin (CUBICIN)  IV 650 mg (10/29/2020 1833)     LOS: 0 days   Georgette Shell,  MD 10/21/2020, 9:33 AM

## 2020-10-22 ENCOUNTER — Inpatient Hospital Stay (HOSPITAL_COMMUNITY): Payer: Medicare PPO

## 2020-10-22 ENCOUNTER — Ambulatory Visit
Admission: RE | Admit: 2020-10-22 | Discharge: 2020-10-22 | Disposition: A | Discharge status: Home / Self Care | Payer: Medicare PPO | Source: Ambulatory Visit | Attending: Radiation Oncology | Admitting: Radiation Oncology

## 2020-10-22 DIAGNOSIS — N390 Urinary tract infection, site not specified: Secondary | ICD-10-CM | POA: Diagnosis not present

## 2020-10-22 DIAGNOSIS — D649 Anemia, unspecified: Secondary | ICD-10-CM | POA: Diagnosis not present

## 2020-10-22 DIAGNOSIS — R7881 Bacteremia: Secondary | ICD-10-CM | POA: Diagnosis not present

## 2020-10-22 DIAGNOSIS — R531 Weakness: Secondary | ICD-10-CM | POA: Diagnosis not present

## 2020-10-22 DIAGNOSIS — L89150 Pressure ulcer of sacral region, unstageable: Secondary | ICD-10-CM | POA: Diagnosis not present

## 2020-10-22 DIAGNOSIS — L02512 Cutaneous abscess of left hand: Secondary | ICD-10-CM

## 2020-10-22 DIAGNOSIS — B961 Klebsiella pneumoniae [K. pneumoniae] as the cause of diseases classified elsewhere: Secondary | ICD-10-CM | POA: Diagnosis not present

## 2020-10-22 DIAGNOSIS — M25511 Pain in right shoulder: Secondary | ICD-10-CM | POA: Diagnosis not present

## 2020-10-22 DIAGNOSIS — K254 Chronic or unspecified gastric ulcer with hemorrhage: Secondary | ICD-10-CM | POA: Diagnosis not present

## 2020-10-22 LAB — COMPREHENSIVE METABOLIC PANEL
ALT: 94 U/L — ABNORMAL HIGH (ref 0–44)
AST: 29 U/L (ref 15–41)
Albumin: 1.8 g/dL — ABNORMAL LOW (ref 3.5–5.0)
Alkaline Phosphatase: 54 U/L (ref 38–126)
Anion gap: 9 (ref 5–15)
BUN: 49 mg/dL — ABNORMAL HIGH (ref 8–23)
CO2: 18 mmol/L — ABNORMAL LOW (ref 22–32)
Calcium: 9.4 mg/dL (ref 8.9–10.3)
Chloride: 104 mmol/L (ref 98–111)
Creatinine, Ser: 1.28 mg/dL — ABNORMAL HIGH (ref 0.61–1.24)
GFR, Estimated: 57 mL/min — ABNORMAL LOW (ref >60)
Glucose, Bld: 209 mg/dL — ABNORMAL HIGH (ref 70–99)
Potassium: 4.2 mmol/L (ref 3.5–5.1)
Sodium: 131 mmol/L — ABNORMAL LOW (ref 135–145)
Total Bilirubin: 1 mg/dL (ref 0.3–1.2)
Total Protein: 5.2 g/dL — ABNORMAL LOW (ref 6.5–8.1)

## 2020-10-22 LAB — GLUCOSE, CAPILLARY
Glucose-Capillary: 175 mg/dL — ABNORMAL HIGH (ref 70–99)
Glucose-Capillary: 166 mg/dL — ABNORMAL HIGH (ref 70–99)
Glucose-Capillary: 291 mg/dL — ABNORMAL HIGH (ref 70–99)
Glucose-Capillary: 330 mg/dL — ABNORMAL HIGH (ref 70–99)

## 2020-10-22 LAB — CULTURE, BLOOD (ROUTINE X 2)

## 2020-10-22 LAB — CBC
HCT: 28.3 % — ABNORMAL LOW (ref 39.0–52.0)
Hemoglobin: 9.1 g/dL — ABNORMAL LOW (ref 13.0–17.0)
MCH: 29.4 pg (ref 26.0–34.0)
MCHC: 32.2 g/dL (ref 30.0–36.0)
MCV: 91.6 fL (ref 80.0–100.0)
Platelets: 149 10*3/uL — ABNORMAL LOW (ref 150–400)
RBC: 3.09 MIL/uL — ABNORMAL LOW (ref 4.22–5.81)
RDW: 18.3 % — ABNORMAL HIGH (ref 11.5–15.5)
WBC: 7.1 10*3/uL (ref 4.0–10.5)
nRBC: 0 % (ref 0.0–0.2)

## 2020-10-22 MED ORDER — JUVEN PO PACK
1.0000 | PACK | Freq: Two times a day (BID) | ORAL | Status: DC
  Administered 2020-10-22: 1 via ORAL
  Filled 2020-10-22 (×3): qty 1

## 2020-10-22 MED ORDER — LIDOCAINE 5 % EX PTCH
1.0000 | MEDICATED_PATCH | CUTANEOUS | Status: DC
  Administered 2020-10-22 – 2020-10-25 (×4): 1 via TRANSDERMAL
  Filled 2020-10-22 (×5): qty 1

## 2020-10-22 MED ORDER — ENSURE ENLIVE PO LIQD
237.0000 mL | Freq: Two times a day (BID) | ORAL | Status: DC
  Administered 2020-10-22: 237 mL via ORAL

## 2020-10-22 MED ORDER — ADULT MULTIVITAMIN W/MINERALS CH
1.0000 | ORAL_TABLET | Freq: Every day | ORAL | Status: DC
  Administered 2020-10-22: 1 via ORAL
  Filled 2020-10-22 (×2): qty 1

## 2020-10-22 MED ORDER — ALBUTEROL SULFATE (2.5 MG/3ML) 0.083% IN NEBU: 2.5000 mg | INHALATION_SOLUTION | RESPIRATORY_TRACT | Status: DC | PRN

## 2020-10-22 NOTE — Progress Notes (Signed)
[facility] transport set up for patient on 3/25 to be picked up from [facility] and transported to [facility] endo at 1030 and to arrive at [place] at 1100 for his TEE. Lobbyist at [employer] made aware of plans for tomorrow.

## 2020-10-22 NOTE — Progress Notes (Signed)
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 Presenting with increased weakness over the last week with anemia as well as dark stools. 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 He normally is able to use a walker to move at least 15 yards per his sister at bedside. Over the last week, he's had increasing overall weakness and lethargy; as well as an acute inability to use his walker or move on his own. She notes that his sacral wound seems worse and may have had some subjective fever. He c/o on shoulder pain, but she notes that he had been putting extra strain on that right shoulder. She believes that he may have injured it during those exercises.  Of note, patient came from [facility].  In the ED, pt was found to have a 3.5 pt drop in his Hgb and he was FOBT positive. GI was consulted. There was concern for worsening BLE weakness. Neurosurgery was consulted. L hand cellulitis was noted. Blood cx noted to grow MRSA. Patient was started on vanc. TRH was called for admission.   Assessment & Plan:   Active Problems:   GIB (gastrointestinal bleeding)   Symptomatic anemia   Heme positive stool   Multiple gastric ulcers   Hiatal hernia   Acute pain of right shoulder   Generalized weakness  #1 MRSA bacteremia/Klebsiella UTI -seen by ID started on daptomycin.  Transthoracic echo -left ventricular ejection fraction 25 to 30%.  Severely decreased function.  Global hypokinesis.  Moderate asymmetric left ventricular hypertrophy of the basal septal segment.  RV systolic function is moderately reduced.  Moderately elevated pulmonary artery systolic  pressure.  Moderate AR moderate AS Transesophageal echo possibly on Friday. Received cefepime for uti  #2 sacral wound present on admission-care.  Patient has numerous skin lesions to buttocks, unstageable pressure injury to the left ilial tuberosity, weeping areas of partial skin loss to the bilateral lower extremities erythema and skin loss right buttock scrotum and medial thighs, trauma to the left hand and first digit. Wound care notes reviewed. Continue low air loss mattress, bilateral heel pressure redistribution heel boots for pressure related injury and prevention.  Topical care to bilateral lower extremities with Xeroform gauze ABD pad secured with Kerlix with daily changes. Blood-filled blister in the left hand and first digit painted with Betadine until it dries  Cherrelle's Butt cream to the butt lesions. Santyl to unstageable pressure injury.  #3 esophageal candidiasis-on fluconazole, will DC statin due to increased risk of rhabdomyolysis.  #4 upper GI bleed -likely secondary to [facility] lesions in the setting of Eliquis use.  Status post EGD with Lysbeth Galas lesions, esophageal plaques consistent with esophageal candidiasis.  10 nonbleeding gastric ulcers with no stigmata of bleeding  H. pylori testing was done pending.  Continue Protonix 40 mg twice a day. No NSAIDs, restart Eliquis when okay with GI. Hemoglobin 9.1   #5 spinal hemangioma-patient getting radiation as he is a very poor surgical candidate.  #6 protein calorie malnutrition continue and appreciate dietary input.ALB 1.8.  #7 hypokalemia potassium 4.2 magnesium 2.1.  3.4 repleted.   #8 right shoulder pain secondary to partial tear of the subscapularis and supraspinatus tendons.  Small right glenohumeral joint effusion and mild joint arthritis.   Estimated body mass index is 24.86 kg/m as calculated from the following:   Height as of this encounter: 5\' 7"  (1.702 m).   Weight as of this encounter: 72 kg.  DVT  prophylaxis: none GI bleed Code Status: Partial code  family Communication:kevin nephew [phone]  disposition Plan:  Status is: Inpatient   Dispo: The patient is from: assisted living              Anticipated d/c is to: SNF              Patient currently is not medically stable to d/c.   Difficult to place patient na   Consultants: GI neurosurgery infectious disease  Procedures:   Antimicrobials   Subjective:  Reports having a rough night  Objective: Vitals:   10/21/20 1359 10/21/20 2031 10/22/20 0410 10/22/20 1117  BP: (!) 152/72 (!) 147/72 134/71 133/68  Pulse: 69 70 68 69  Resp: 18 18 16  (!) 21  Temp: 97.6 F (36.4 C) 97.6 F (36.4 C) 97.6 F (36.4 C)   TempSrc: Oral Oral Oral   SpO2: 100% 100% 99% 97%  Weight:      Height:        Intake/Output Summary (Last 24 hours) at 10/22/2020 1255 Last data filed at 10/22/2020 1100 Gross per 24 hour  Intake 473.27 ml  Output 1590 ml  Net -1116.73 ml   Filed Weights   10/22/2020 2100  Weight: 72 kg    Examination:  General exam: frail elderly male in distress due to pain and discomfort  Respiratory system: Coarse breath sounds and rhonchi  to auscultation. Respiratory effort normal. Cardiovascular system: S1 & S2 heard, RRR. No JVD, murmurs, rubs, gallops or clicks. No pedal edema. Gastrointestinal system: Abdomen is nondistended, soft and nontender. No organomegaly or masses felt. Normal bowel sounds heard. Central nervous system: 3 x 5 in both lower extremities extremities: 1+ edema Skin: Multiple excoriated lesions in the lower extremities and multiple decubitus lesions in the sacrum and scrotum and medial thighs.   Index finger with a small blood-filled blister from from injury during nail cutting. Psychiatry: Judgement and insight appear normal. Mood & affect appropriate.     Data Reviewed: I have personally reviewed following labs and imaging studies  CBC: Recent Labs  Lab 10/27/2020 1013 10/22/2020 1537  10/04/2020 2352 10/25/2020 1420 10/04/2020 1921 10/21/20 0238 10/21/20 1636 10/22/20 0056  WBC 5.8  --  5.7 7.1 7.0 8.4  --  7.1  NEUTROABS 5.3  --   --   --   --  7.8*  --   --   HGB 8.2*   < > 8.2* 8.8* 9.4* 7.8* 9.8* 9.1*  HCT 24.8*   < > 24.5* 27.2* 29.0* 24.0* 30.2* 28.3*  MCV 88.3  --  88.1 89.8 90.9 90.6  --  91.6  PLT 106*  --  109* 125* 124* 145*  --  149*   < > = values in this interval not displayed.   Basic Metabolic Panel: Recent Labs  Lab 10/18/2020 1013 10/22/2020 2352 10/21/20 0238 10/22/20 0056  NA 134* 132* 135 131*  K 3.6 3.5 3.4* 4.2  CL 100 99 105 104  CO2 27 23 20* 18*  GLUCOSE 121* 104* 234* 209*  BUN 40* 39* 46* 49*  CREATININE 1.38* 1.29* 1.37* 1.28*  CALCIUM 9.4 9.2 9.4 9.4  MG  --   --  2.1  --  GFR: Estimated Creatinine Clearance: 44.5 mL/min (A) (by C-G formula based on SCr of 1.28 mg/dL (H)). Liver Function Tests: Recent Labs  Lab 10/11/2020 1013 10/28/2020 2352 10/22/20 0056  AST 32 32 29  ALT 69* 69* 94*  ALKPHOS 43 44 54  BILITOT 0.9 1.0 1.0  PROT 4.9* 4.9* 5.2*  ALBUMIN 1.7* 1.7* 1.8*   No results for input(s): LIPASE, AMYLASE in the last 168 hours. No results for input(s): AMMONIA in the last 168 hours. Coagulation Profile: Recent Labs  Lab 10/24/2020 1013  INR 2.2*   Cardiac Enzymes: Recent Labs  Lab 10/21/20 0238  CKTOTAL 16*   BNP (last 3 results) No results for input(s): PROBNP in the last 8760 hours. HbA1C: No results for input(s): HGBA1C in the last 72 hours. CBG: Recent Labs  Lab 10/21/20 0750 10/21/20 1144 10/21/20 1652 10/21/20 2035 10/22/20 0752  GLUCAP 218* 299* 234* 258* 175*   Lipid Profile: No results for input(s): CHOL, HDL, LDLCALC, TRIG, CHOLHDL, LDLDIRECT in the last 72 hours. Thyroid Function Tests: No results for input(s): TSH, T4TOTAL, FREET4, T3FREE, THYROIDAB in the last 72 hours. Anemia Panel: No results for input(s): VITAMINB12, FOLATE, FERRITIN, TIBC, IRON, RETICCTPCT in the last 72  hours. Sepsis Labs: Recent Labs  Lab 10/21/2020 1013 10/25/2020 1213  LATICACIDVEN 1.0 1.1    Recent Results (from the past 240 hour(s))  Blood Culture (routine x 2)     Status: Abnormal   Collection Time: 10/24/2020 10:00 AM   Specimen: BLOOD LEFT HAND  Result Value Ref Range Status   Specimen Description   Final    BLOOD LEFT HAND Performed at [facility], [place] [address] [place], [place] [address]    Special Requests   Final    BOTTLES DRAWN AEROBIC AND ANAEROBIC Blood Culture results may not be optimal due to an inadequate volume of blood received in culture bottles Performed at [place] [address] [place], [place] [address]    Culture  Setup Time   Final    IN BOTH AEROBIC AND ANAEROBIC BOTTLES GRAM POSITIVE COCCI CRITICAL VALUE NOTED.  VALUE IS CONSISTENT WITH PREVIOUSLY REPORTED AND CALLED VALUE.    Culture (A)  Final    STAPHYLOCOCCUS AUREUS SUSCEPTIBILITIES PERFORMED ON PREVIOUS CULTURE WITHIN THE LAST 5 DAYS. Performed at [facility], [place] [address] [place], [place] [address]    Report Status 10/22/2020 FINAL  Final  Resp Panel by RT-PCR (Flu A&B, Covid) Nasopharyngeal Swab     Status: None   Collection Time: 10/24/2020 10:13 AM   Specimen: Nasopharyngeal Swab; Nasopharyngeal(NP) swabs in vial transport medium  Result Value Ref Range Status   SARS Coronavirus 2 by RT PCR NEGATIVE NEGATIVE Final    Comment: (NOTE) SARS-CoV-2 target nucleic acids are NOT DETECTED.  The SARS-CoV-2 RNA is generally detectable in upper respiratory specimens during the acute phase of infection. The lowest concentration of SARS-CoV-2 viral copies this assay can detect is 138 copies/mL. A negative result does not preclude SARS-Cov-2 infection and should not be used as the sole basis for treatment or other patient management decisions. A negative result may occur with  improper specimen collection/handling, submission of specimen  other than nasopharyngeal swab, presence of viral mutation(s) within the areas targeted by this assay, and inadequate number of viral copies(<138 copies/mL). A negative result must be combined with clinical observations, patient history, and epidemiological information. The expected result is Negative.  Fact Sheet for Patients:  EntrepreneurPulse.com.au  Fact Sheet  for Healthcare Providers:  IncredibleEmployment.be  This test is no t yet approved or cleared by the [place] and  has been authorized for detection and/or diagnosis of SARS-CoV-2 by FDA under an Emergency Use Authorization (EUA). This EUA will remain  in effect (meaning this test can be used) for the duration of the COVID-19 declaration under Section 564(b)(1) of the Act, 21 U.S.C.section 360bbb-3(b)(1), unless the authorization is terminated  or revoked sooner.       Influenza A by PCR NEGATIVE NEGATIVE Final   Influenza B by PCR NEGATIVE NEGATIVE Final    Comment: (NOTE) The Xpert Xpress SARS-CoV-2/FLU/RSV plus assay is intended as an aid in the diagnosis of influenza from Nasopharyngeal swab specimens and should not be used as a sole basis for treatment. Nasal washings and aspirates are unacceptable for Xpert Xpress SARS-CoV-2/FLU/RSV testing.  Fact Sheet for Patients: EntrepreneurPulse.com.au  Fact Sheet for Healthcare Providers: IncredibleEmployment.be  This test is not yet approved or cleared by the [place] FDA and has been authorized for detection and/or diagnosis of SARS-CoV-2 by FDA under an Emergency Use Authorization (EUA). This EUA will remain in effect (meaning this test can be used) for the duration of the COVID-19 declaration under Section 564(b)(1) of the Act, 21 U.S.C. section 360bbb-3(b)(1), unless the authorization is terminated or revoked.  Performed at [facility], [place] [address] [place], [place] [address]   Blood Culture (routine x 2)     Status: Abnormal   Collection Time: 10/22/2020 10:15 AM   Specimen: BLOOD  Result Value Ref Range Status   Specimen Description   Final    BLOOD LEFT ANTECUBITAL Performed at [place] [address], [place], [place] [address]    Special Requests   Final    BOTTLES DRAWN AEROBIC AND ANAEROBIC Blood Culture results may not be optimal due to an inadequate volume of blood received in culture bottles Performed at [place] [address] [place], [place] [address]    Culture  Setup Time   Final    IN BOTH AEROBIC AND ANAEROBIC BOTTLES GRAM POSITIVE COCCI CRITICAL RESULT CALLED TO, READ BACK BY AND VERIFIED WITHSeleta Rhymes [facility] 10/25/2020 0245 JDW Performed at [facility], [place] [address], [place], [place] [address]    Culture METHICILLIN RESISTANT STAPHYLOCOCCUS AUREUS (A)  Final   Report Status 10/22/2020 FINAL  Final   Organism ID, Bacteria METHICILLIN RESISTANT STAPHYLOCOCCUS AUREUS  Final      Susceptibility   Methicillin resistant staphylococcus aureus - MIC*    CIPROFLOXACIN >=8 RESISTANT Resistant     ERYTHROMYCIN >=8 RESISTANT Resistant     GENTAMICIN <=0.5 SENSITIVE Sensitive     OXACILLIN >=4 RESISTANT Resistant     TETRACYCLINE <=1 SENSITIVE Sensitive     VANCOMYCIN <=0.5 SENSITIVE Sensitive     TRIMETH/SULFA <=10 SENSITIVE Sensitive     CLINDAMYCIN RESISTANT Resistant     RIFAMPIN <=0.5 SENSITIVE Sensitive     Inducible Clindamycin POSITIVE Resistant     * METHICILLIN RESISTANT STAPHYLOCOCCUS AUREUS  Blood Culture ID Panel (Reflexed)     Status: Abnormal   Collection Time: 10/07/2020 10:15 AM  Result Value Ref Range Status   Enterococcus faecalis NOT DETECTED NOT DETECTED Final   Enterococcus Faecium NOT DETECTED NOT DETECTED Final   Listeria monocytogenes NOT DETECTED NOT DETECTED Final   Staphylococcus species DETECTED (A) NOT DETECTED Final    Comment:  CRITICAL RESULT CALLED TO, READ BACK BY  AND VERIFIED WITH: Seleta Rhymes [facility] 10/07/2020 0245 JDW    Staphylococcus aureus (BCID) DETECTED (A) NOT DETECTED Final    Comment: Methicillin (oxacillin)-resistant Staphylococcus aureus (MRSA). MRSA is predictably resistant to beta-lactam antibiotics (except ceftaroline). Preferred therapy is vancomycin unless clinically contraindicated. Patient requires contact precautions if  hospitalized. CRITICAL RESULT CALLED TO, READ BACK BY AND VERIFIED WITH: E JACKSON PHARMD 09/29/2020 0245 JDW    Staphylococcus epidermidis NOT DETECTED NOT DETECTED Final   Staphylococcus lugdunensis NOT DETECTED NOT DETECTED Final   Streptococcus species NOT DETECTED NOT DETECTED Final   Streptococcus agalactiae NOT DETECTED NOT DETECTED Final   Streptococcus pneumoniae NOT DETECTED NOT DETECTED Final   Streptococcus pyogenes NOT DETECTED NOT DETECTED Final   A.calcoaceticus-baumannii NOT DETECTED NOT DETECTED Final   Bacteroides fragilis NOT DETECTED NOT DETECTED Final   Enterobacterales NOT DETECTED NOT DETECTED Final   Enterobacter cloacae complex NOT DETECTED NOT DETECTED Final   Escherichia coli NOT DETECTED NOT DETECTED Final   Klebsiella aerogenes NOT DETECTED NOT DETECTED Final   Klebsiella oxytoca NOT DETECTED NOT DETECTED Final   Klebsiella pneumoniae NOT DETECTED NOT DETECTED Final   Proteus species NOT DETECTED NOT DETECTED Final   Salmonella species NOT DETECTED NOT DETECTED Final   Serratia marcescens NOT DETECTED NOT DETECTED Final   Haemophilus influenzae NOT DETECTED NOT DETECTED Final   Neisseria meningitidis NOT DETECTED NOT DETECTED Final   Pseudomonas aeruginosa NOT DETECTED NOT DETECTED Final   Stenotrophomonas maltophilia NOT DETECTED NOT DETECTED Final   Candida albicans NOT DETECTED NOT DETECTED Final   Candida auris NOT DETECTED NOT DETECTED Final   Candida glabrata NOT DETECTED NOT DETECTED Final   Candida krusei NOT DETECTED NOT DETECTED Final    Candida parapsilosis NOT DETECTED NOT DETECTED Final   Candida tropicalis NOT DETECTED NOT DETECTED Final   Cryptococcus neoformans/gattii NOT DETECTED NOT DETECTED Final   Meth resistant mecA/C and MREJ DETECTED (A) NOT DETECTED Final    Comment: CRITICAL RESULT CALLED TO, READ BACK BY AND VERIFIED WITHSeleta Rhymes [facility] 10/11/2020 0245 JDW Performed at [facility], [address] [place], [place] [address]   Urine culture     Status: Abnormal   Collection Time: 10/06/2020 11:02 AM   Specimen: In/Out Cath Urine  Result Value Ref Range Status   Specimen Description   Final    IN/OUT CATH URINE Performed at [facility] [address], [place], [place] [address]    Special Requests   Final    NONE Performed at [facility], [place] [address] [place], [place] [address]    Culture >=100,000 COLONIES/mL KLEBSIELLA PNEUMONIAE (A)  Final   Report Status 10/21/2020 FINAL  Final   Organism ID, Bacteria KLEBSIELLA PNEUMONIAE (A)  Final      Susceptibility   Klebsiella pneumoniae - MIC*    AMPICILLIN >=32 RESISTANT Resistant     CEFAZOLIN <=4 SENSITIVE Sensitive     CEFEPIME <=0.12 SENSITIVE Sensitive     CEFTRIAXONE <=0.25 SENSITIVE Sensitive     CIPROFLOXACIN <=0.25 SENSITIVE Sensitive     GENTAMICIN <=1 SENSITIVE Sensitive     IMIPENEM <=0.25 SENSITIVE Sensitive     NITROFURANTOIN 128 RESISTANT Resistant     TRIMETH/SULFA >=320 RESISTANT Resistant     AMPICILLIN/SULBACTAM >=32 RESISTANT Resistant     PIP/TAZO 32 INTERMEDIATE Intermediate     * >=100,000 COLONIES/mL KLEBSIELLA PNEUMONIAE  Culture, blood (routine x 2)     Status: None (Preliminary result)   Collection  Time: 10/21/20  2:38 AM   Specimen: BLOOD  Result Value Ref Range Status   Specimen Description   Final    BLOOD LEFT HAND Performed at [place] [address], [place], [place] [address]    Special Requests   Final    BOTTLES DRAWN AEROBIC ONLY  Blood Culture adequate volume Performed at [place] [address] [place], [place] [address]    Culture   Final    NO GROWTH < 24 HOURS Performed at [facility] [address], [place], [place] [address]    Report Status PENDING  Incomplete  Culture, blood (routine x 2)     Status: None (Preliminary result)   Collection Time: 10/21/20  2:39 AM   Specimen: BLOOD  Result Value Ref Range Status   Specimen Description   Final    BLOOD BLOOD RIGHT FOREARM Performed at [facility] [address], [place], [place] [address]    Special Requests   Final    BOTTLES DRAWN AEROBIC ONLY Blood Culture results may not be optimal due to an inadequate volume of blood received in culture bottles Performed at [facility] [address], [place], [place] [address]    Culture   Final    NO GROWTH < 24 HOURS Performed at [place] [address] [place], [place] [address]    Report Status PENDING  Incomplete         Radiology Studies: CT SHOULDER RIGHT WO CONTRAST  Result Date: 10/21/2020 CLINICAL DATA:  Shoulder trauma EXAM: CT OF THE UPPER RIGHT EXTREMITY WITHOUT CONTRAST TECHNIQUE: Multidetector CT imaging of the upper right extremity was performed according to the standard protocol. COMPARISON:  None. FINDINGS: Bones/Joint/Cartilage No fracture or dislocation. There is mild glenohumeral joint osteoarthritis with joint space loss. Mild AC joint arthrosis is noted. A small joint effusion is present. Ligaments Suboptimally assessed by CT. Muscles and Tendons Probable partial tear of the superior subscapularis and anterior supraspinatus is noted. There is mild overlying soft tissue swelling. The infraspinatus and teres minor tendons do appear to be grossly intact. No muscular atrophy is seen. Soft tissues Mild soft tissue edema seen along the lateral and posterior aspect of the shoulder. Scattered vascular  calcifications are noted. A small right pleural effusion with patchy airspace opacity at the right lung base is noted IMPRESSION: No acute osseous abnormality Probable partial tear of the superior subscapularis and anterior supraspinatus tendons, however somewhat limited due to technique Small glenohumeral joint effusion Mild glenohumeral joint osteoarthritis Small right pleural effusion and adjacent patchy airspace opacity at the right lung base Electronically Signed   By: Prudencio Pair M.D.   On: 10/21/2020 17:55   ECHOCARDIOGRAM COMPLETE  Result Date: 10/21/2020    ECHOCARDIOGRAM REPORT   Patient Name:   BURL TAUZIN Date of Exam: 10/21/2020 Medical Rec #:  [id number]    Height:       67.0 in Accession #:    [id number]   Weight:       158.7 lb Date of Birth:  07-03-1941    BSA:          1.833 m Patient Age:    50 years     BP:           130/67 mmHg Patient Gender: M            HR:           70  bpm. Exam Location:  Inpatient Procedure: 2D Echo, Cardiac Doppler and Color Doppler Indications:    Bacteremia R78.81  History:        Patient has no prior history of Echocardiogram examinations.                 Cardiomyopathy and CHF, CAD, Defibrillator and Prior CABG,                 Arrythmias:Atrial Fibrillation; Risk Factors:Dyslipidemia,                 Diabetes and Sleep Apnea. CKD.  Sonographer:    Tiffany Dance Referring Phys: [id number] [place]  1. Left ventricular ejection fraction, by estimation, is 25 to 30%. The left ventricle has severely decreased function. The left ventricle demonstrates global hypokinesis. There is moderate asymmetric left ventricular hypertrophy of the basal-septal segment. Left ventricular diastolic parameters are indeterminate.  2. Right ventricular systolic function is moderately reduced. The right ventricular size is mildly enlarged. There is moderately elevated pulmonary artery systolic pressure. The estimated right ventricular systolic pressure is 28.3 mmHg.   3. Left atrial size was severely dilated.  4. Right atrial size was severely dilated.  5. The mitral valve is abnormal. Moderate mitral valve regurgitation. Moderate mitral stenosis. MG 6 mmHg at HR 70 bpm, MVA 1.2 cm^2 by continuity equation. Severe mitral annular calcification.  6. Tricuspid valve regurgitation is mild to moderate.  7. The aortic valve is tricuspid. There is moderate calcification of the aortic valve. Aortic valve regurgitation is moderate. Mild to moderate aortic valve stenosis. AVA (1.3 cm^2) and DI (0.4) suggest moderate AS, but MG only 38mmHg likely due to systolic dysfunction  8. The inferior vena cava is dilated in size with >50% respiratory variability, suggesting right atrial pressure of 8 mmHg.  9. Evidence of atrial level shunting detected by color flow Doppler, suggests PFO. Conclusion(s)/Recommendation(s): No vegetation seen. If clinical suspicion for endocarditis, recommend TEE. FINDINGS  Left Ventricle: Left ventricular ejection fraction, by estimation, is 25 to 30%. The left ventricle has severely decreased function. The left ventricle demonstrates global hypokinesis. The left ventricular internal cavity size was normal in size. There is moderate asymmetric left ventricular hypertrophy of the basal-septal segment. Left ventricular diastolic parameters are indeterminate. Right Ventricle: The right ventricular size is mildly enlarged. Right vetricular wall thickness was not well visualized. Right ventricular systolic function is moderately reduced. There is moderately elevated pulmonary artery systolic pressure. The tricuspid regurgitant velocity is 3.59 m/s, and with an assumed right atrial pressure of 8 mmHg, the estimated right ventricular systolic pressure is 15.1 mmHg. Left Atrium: Left atrial size was severely dilated. Right Atrium: Right atrial size was severely dilated. Pericardium: There is no evidence of pericardial effusion. Mitral Valve: The mitral valve is abnormal. Severe  mitral annular calcification. Moderate mitral valve regurgitation. Moderate mitral valve stenosis. MV peak gradient, 17.3 mmHg. The mean mitral valve gradient is 6.0 mmHg. Tricuspid Valve: The tricuspid valve is normal in structure. Tricuspid valve regurgitation is mild to moderate. Aortic Valve: The aortic valve is tricuspid. There is moderate calcification of the aortic valve. Aortic valve regurgitation is moderate. Aortic regurgitation PHT measures 359 msec. Mild to moderate aortic stenosis is present. Aortic valve mean gradient measures 8.0 mmHg. Aortic valve peak gradient measures 16.5 mmHg. Aortic valve area, by VTI measures 1.31 cm. Pulmonic Valve: The pulmonic valve was grossly normal. Pulmonic valve regurgitation is not visualized. Aorta: The aortic root and ascending aorta are structurally  normal, with no evidence of dilitation. Venous: The inferior vena cava is dilated in size with greater than 50% respiratory variability, suggesting right atrial pressure of 8 mmHg. IAS/Shunts: Evidence of atrial level shunting detected by color flow Doppler.  LEFT VENTRICLE PLAX 2D LVIDd:         4.90 cm  Diastology LVIDs:         4.30 cm  LV e' medial:    2.68 cm/s LV PW:         1.30 cm  LV E/e' medial:  70.5 LV IVS:        1.50 cm  LV e' lateral:   4.81 cm/s LVOT diam:     2.00 cm  LV E/e' lateral: 39.3 LV SV:         51 LV SV Index:   28 LVOT Area:     3.14 cm  RIGHT VENTRICLE            IVC RV Basal diam:  3.70 cm    IVC diam: 2.50 cm RV Mid diam:    2.60 cm RV S prime:     7.83 cm/s TAPSE (M-mode): 1.3 cm LEFT ATRIUM              Index       RIGHT ATRIUM           Index LA diam:        6.20 cm  3.38 cm/m  RA Area:     30.20 cm LA Vol (A2C):   140.0 ml 76.37 ml/m RA Volume:   108.00 ml 58.92 ml/m LA Vol (A4C):   138.0 ml 75.28 ml/m LA Biplane Vol: 142.0 ml 77.47 ml/m  AORTIC VALVE AV Area (Vmax):    1.23 cm AV Area (Vmean):   1.36 cm AV Area (VTI):     1.31 cm AV Vmax:           203.00 cm/s AV Vmean:           133.000 cm/s AV VTI:            0.390 m AV Peak Grad:      16.5 mmHg AV Mean Grad:      8.0 mmHg LVOT Vmax:         79.40 cm/s LVOT Vmean:        57.400 cm/s LVOT VTI:          0.163 m LVOT/AV VTI ratio: 0.42 AI PHT:            359 msec  AORTA Ao Root diam: 3.40 cm Ao Asc diam:  3.60 cm MITRAL VALVE                 TRICUSPID VALVE MV Area (PHT): 2.78 cm      TR Peak grad:   51.6 mmHg MV Area VTI:   1.21 cm      TR Vmax:        359.00 cm/s MV Peak grad:  17.3 mmHg MV Mean grad:  6.0 mmHg      SHUNTS MV Vmax:       2.08 m/s      Systemic VTI:  0.16 m MV Vmean:      108.0 cm/s    Systemic Diam: 2.00 cm MV Decel Time: 273 msec MR Peak grad:    131.8 mmHg MR Mean grad:    79.5 mmHg MR Vmax:         574.00 cm/s MR Vmean:  416.5 cm/s MR PISA:         2.26 cm MR PISA Eff ROA: 16 mm MR PISA Radius:  0.60 cm MV E velocity: 189.00 cm/s Oswaldo Milian MD Electronically signed by Oswaldo Milian MD Signature Date/Time: 10/21/2020/4:55:55 PM    Final         Scheduled Meds: . carvedilol  12.5 mg Oral BID  . collagenase   Topical Daily  . dexamethasone  12 mg Oral Q8H  . digoxin  0.125 mg Oral Daily  . feeding supplement  237 mL Oral BID BM  . ferrous sulfate  325 mg Oral Q breakfast  . finasteride  5 mg Oral QHS  . fluconazole  100 mg Oral Daily  . Gerhardt's butt cream   Topical TID  . insulin aspart  0-15 Units Subcutaneous TID WC  . insulin aspart  0-5 Units Subcutaneous QHS  . lidocaine  1 patch Transdermal Q24H  . multivitamin with minerals  1 tablet Oral Daily  . nutrition supplement (JUVEN)  1 packet Oral BID BM  . pantoprazole (PROTONIX) IV  40 mg Intravenous Q12H  . sotalol  80 mg Oral Daily   And  . sotalol  120 mg Oral QHS  . tamsulosin  0.4 mg Oral Daily   Continuous Infusions: . DAPTOmycin (CUBICIN)  IV Stopped (10/21/20 2128)     LOS: 1 day   Georgette Shell, MD 10/22/2020, 12:55 PM

## 2020-10-22 NOTE — Progress Notes (Signed)
[name]    Date of Admission:  10/24/2020   Total days of antibiotics 4           ID: [name] is a 80 y.o. male with  MRSA bacteremia with sacral decub  Active Problems:   GIB (gastrointestinal bleeding)   Symptomatic anemia   Heme positive stool   Multiple gastric ulcers   Hiatal hernia   Acute pain of right shoulder   Generalized weakness    Subjective: Afebrile, feels like he has poor sleep overnight ,fatigued. Left hand still tender on exam  Medications:   carvedilol  12.5 mg Oral BID   collagenase   Topical Daily   dexamethasone  12 mg Oral Q8H   digoxin  0.125 mg Oral Daily   feeding supplement  237 mL Oral BID BM   ferrous sulfate  325 mg Oral Q breakfast   finasteride  5 mg Oral QHS   fluconazole  100 mg Oral Daily   Gerhardt's butt cream   Topical TID   insulin aspart  0-15 Units Subcutaneous TID WC   insulin aspart  0-5 Units Subcutaneous QHS   lidocaine  1 patch Transdermal Q24H   multivitamin with minerals  1 tablet Oral Daily   nutrition supplement (JUVEN)  1 packet Oral BID BM   pantoprazole (PROTONIX) IV  40 mg Intravenous Q12H   sotalol  80 mg Oral Daily   And   sotalol  120 mg Oral QHS   tamsulosin  0.4 mg Oral Daily    Objective: Vital signs in last 24 hours: Temp:  [97.6 F (36.4 C)] 97.6 F (36.4 C) (03/24 0410) Pulse Rate:  [68-70] 69 (03/24 1117) Resp:  [16-21] 21 (03/24 1117) BP: (133-147)/(68-72) 133/68 (03/24 1117) SpO2:  [97 %-100 %] 97 % (03/24 1117) Physical Exam  Constitutional: He is oriented to person, place, and time. He appears well-developed and well-nourished. No distress.  HENT:  Mouth/Throat: Oropharynx is clear and moist. No oropharyngeal exudate.  Cardiovascular: Normal rate, regular rhythm and normal heart sounds. Exam reveals no gallop and no friction rub.  No murmur heard.  Pulmonary/Chest: Effort normal and breath sounds normal. No respiratory distress. He has  no wheezes.  Abdominal: Soft. Bowel sounds are normal. He exhibits no distension. There is no tenderness.  Ext: left hand swelling, tenderness on mild palpation Neurological: He is alert and oriented to person, place, and time.  Skin: Skin is warm and dry. No rash noted. No erythema.  Psychiatric: He has a normal mood and affect. His behavior is normal.    Lab Results Recent Labs    10/21/20 0238 10/21/20 1636 10/22/20 0056  WBC 8.4  --  7.1  HGB 7.8* 9.8* 9.1*  HCT 24.0* 30.2* 28.3*  NA 135  --  131*  K 3.4*  --  4.2  CL 105  --  104  CO2 20*  --  18*  BUN 46*  --  49*  CREATININE 1.37*  --  1.28*   Liver Panel Recent Labs    10/06/2020 2352 10/22/20 0056  PROT 4.9* 5.2*  ALBUMIN 1.7* 1.8*  AST 32 29  ALT 69* 94*  ALKPHOS 44 54  BILITOT 1.0 1.0   No results found for: [place]  Microbiology: reviewed Studies/Results: DG Chest 1 View  Result Date: 10/22/2020 CLINICAL DATA:  Cough. EXAM: CHEST  1 VIEW COMPARISON:  October 19, 2020. FINDINGS: Stable cardiomegaly. Status post coronary bypass graft. Left-sided pacemaker  is unchanged in position. No pneumothorax or pleural effusion is noted. Lungs are clear. Bony thorax is unremarkable. IMPRESSION: No active disease. Electronically Signed   By: Marijo Conception M.D.   On: 10/22/2020 13:35   CT SHOULDER RIGHT WO CONTRAST  Result Date: 10/21/2020 CLINICAL DATA:  Shoulder trauma EXAM: CT OF THE UPPER RIGHT EXTREMITY WITHOUT CONTRAST TECHNIQUE: Multidetector CT imaging of the upper right extremity was performed according to the standard protocol. COMPARISON:  None. FINDINGS: Bones/Joint/Cartilage No fracture or dislocation. There is mild glenohumeral joint osteoarthritis with joint space loss. Mild AC joint arthrosis is noted. A small joint effusion is present. Ligaments Suboptimally assessed by CT. Muscles and Tendons Probable partial tear of the superior subscapularis and anterior supraspinatus is noted. There is  mild overlying soft tissue swelling. The infraspinatus and teres minor tendons do appear to be grossly intact. No muscular atrophy is seen. Soft tissues Mild soft tissue edema seen along the lateral and posterior aspect of the shoulder. Scattered vascular calcifications are noted. A small right pleural effusion with patchy airspace opacity at the right lung base is noted IMPRESSION: No acute osseous abnormality Probable partial tear of the superior subscapularis and anterior supraspinatus tendons, however somewhat limited due to technique Small glenohumeral joint effusion Mild glenohumeral joint osteoarthritis Small right pleural effusion and adjacent patchy airspace opacity at the right lung base Electronically Signed   By: Prudencio Pair M.D.   On: 10/21/2020 17:55   ECHOCARDIOGRAM COMPLETE  Result Date: 10/21/2020    ECHOCARDIOGRAM REPORT   Patient Name:   Timothy Neal Date of Exam: 10/21/2020 Medical Rec #:  [id number]    Height:       67.0 in Accession #:    [id number]   Weight:       158.7 lb Date of Birth:  04-30-1941    BSA:          1.833 m Patient Age:    36 years     BP:           130/67 mmHg Patient Gender: M            HR:           70 bpm. Exam Location:  Inpatient Procedure: 2D Echo, Cardiac Doppler and Color Doppler Indications:    Bacteremia R78.81  History:        Patient has no prior history of Echocardiogram examinations.                 Cardiomyopathy and CHF, CAD, Defibrillator and Prior CABG,                 Arrythmias:Atrial Fibrillation; Risk Factors:Dyslipidemia,                 Diabetes and Sleep Apnea. CKD.  Sonographer:    Tiffany Dance Referring Phys: [id number] [place]  1. Left ventricular ejection fraction, by estimation, is 25 to 30%. The left ventricle has severely decreased function. The left ventricle demonstrates global hypokinesis. There is moderate asymmetric left ventricular hypertrophy of the basal-septal segment. Left ventricular diastolic parameters are  indeterminate.  2. Right ventricular systolic function is moderately reduced. The right ventricular size is mildly enlarged. There is moderately elevated pulmonary artery systolic pressure. The estimated right ventricular systolic pressure is 97.6 mmHg.  3. Left atrial size was severely dilated.  4. Right atrial size was severely dilated.  5. The mitral valve is abnormal. Moderate mitral valve regurgitation. Moderate mitral stenosis. MG  6 mmHg at HR 70 bpm, MVA 1.2 cm^2 by continuity equation. Severe mitral annular calcification.  6. Tricuspid valve regurgitation is mild to moderate.  7. The aortic valve is tricuspid. There is moderate calcification of the aortic valve. Aortic valve regurgitation is moderate. Mild to moderate aortic valve stenosis. AVA (1.3 cm^2) and DI (0.4) suggest moderate AS, but MG only 11mmHg likely due to systolic dysfunction  8. The inferior vena cava is dilated in size with >50% respiratory variability, suggesting right atrial pressure of 8 mmHg.  9. Evidence of atrial level shunting detected by color flow Doppler, suggests PFO. Conclusion(s)/Recommendation(s): No vegetation seen. If clinical suspicion for endocarditis, recommend TEE. FINDINGS  Left Ventricle: Left ventricular ejection fraction, by estimation, is 25 to 30%. The left ventricle has severely decreased function. The left ventricle demonstrates global hypokinesis. The left ventricular internal cavity size was normal in size. There is moderate asymmetric left ventricular hypertrophy of the basal-septal segment. Left ventricular diastolic parameters are indeterminate. Right Ventricle: The right ventricular size is mildly enlarged. Right vetricular wall thickness was not well visualized. Right ventricular systolic function is moderately reduced. There is moderately elevated pulmonary artery systolic pressure. The tricuspid regurgitant velocity is 3.59 m/s, and with an assumed right atrial pressure of 8 mmHg, the estimated right  ventricular systolic pressure is 62.6 mmHg. Left Atrium: Left atrial size was severely dilated. Right Atrium: Right atrial size was severely dilated. Pericardium: There is no evidence of pericardial effusion. Mitral Valve: The mitral valve is abnormal. Severe mitral annular calcification. Moderate mitral valve regurgitation. Moderate mitral valve stenosis. MV peak gradient, 17.3 mmHg. The mean mitral valve gradient is 6.0 mmHg. Tricuspid Valve: The tricuspid valve is normal in structure. Tricuspid valve regurgitation is mild to moderate. Aortic Valve: The aortic valve is tricuspid. There is moderate calcification of the aortic valve. Aortic valve regurgitation is moderate. Aortic regurgitation PHT measures 359 msec. Mild to moderate aortic stenosis is present. Aortic valve mean gradient measures 8.0 mmHg. Aortic valve peak gradient measures 16.5 mmHg. Aortic valve area, by VTI measures 1.31 cm. Pulmonic Valve: The pulmonic valve was grossly normal. Pulmonic valve regurgitation is not visualized. Aorta: The aortic root and ascending aorta are structurally normal, with no evidence of dilitation. Venous: The inferior vena cava is dilated in size with greater than 50% respiratory variability, suggesting right atrial pressure of 8 mmHg. IAS/Shunts: Evidence of atrial level shunting detected by color flow Doppler.  LEFT VENTRICLE PLAX 2D LVIDd:         4.90 cm  Diastology LVIDs:         4.30 cm  LV e' medial:    2.68 cm/s LV PW:         1.30 cm  LV E/e' medial:  70.5 LV IVS:        1.50 cm  LV e' lateral:   4.81 cm/s LVOT diam:     2.00 cm  LV E/e' lateral: 39.3 LV SV:         51 LV SV Index:   28 LVOT Area:     3.14 cm  RIGHT VENTRICLE            IVC RV Basal diam:  3.70 cm    IVC diam: 2.50 cm RV Mid diam:    2.60 cm RV S prime:     7.83 cm/s TAPSE (M-mode): 1.3 cm LEFT ATRIUM              Index  RIGHT ATRIUM           Index LA diam:        6.20 cm  3.38 cm/m  RA Area:     30.20 cm LA Vol (A2C):   140.0 ml  76.37 ml/m RA Volume:   108.00 ml 58.92 ml/m LA Vol (A4C):   138.0 ml 75.28 ml/m LA Biplane Vol: 142.0 ml 77.47 ml/m  AORTIC VALVE AV Area (Vmax):    1.23 cm AV Area (Vmean):   1.36 cm AV Area (VTI):     1.31 cm AV Vmax:           203.00 cm/s AV Vmean:          133.000 cm/s AV VTI:            0.390 m AV Peak Grad:      16.5 mmHg AV Mean Grad:      8.0 mmHg LVOT Vmax:         79.40 cm/s LVOT Vmean:        57.400 cm/s LVOT VTI:          0.163 m LVOT/AV VTI ratio: 0.42 AI PHT:            359 msec  AORTA Ao Root diam: 3.40 cm Ao Asc diam:  3.60 cm MITRAL VALVE                 TRICUSPID VALVE MV Area (PHT): 2.78 cm      TR Peak grad:   51.6 mmHg MV Area VTI:   1.21 cm      TR Vmax:        359.00 cm/s MV Peak grad:  17.3 mmHg MV Mean grad:  6.0 mmHg      SHUNTS MV Vmax:       2.08 m/s      Systemic VTI:  0.16 m MV Vmean:      108.0 cm/s    Systemic Diam: 2.00 cm MV Decel Time: 273 msec MR Peak grad:    131.8 mmHg MR Mean grad:    79.5 mmHg MR Vmax:         574.00 cm/s MR Vmean:        416.5 cm/s MR PISA:         2.26 cm MR PISA Eff ROA: 16 mm MR PISA Radius:  0.60 cm MV E velocity: 189.00 cm/s Oswaldo Milian MD Electronically signed by Oswaldo Milian MD Signature Date/Time: 10/21/2020/4:55:55 PM    Final      Assessment/Plan: MRSA bacteremia = continue on daptomycin. Recommend to get TEE to evaluate his cardiac device, since TTE unrevealing  Left hand swelling/abscess == please see if ortho can do bedside aspiration to see if c/w infection. Send fluid for cultures? Evaluate for I x D.  Sacral decub ulcer = continue with wound car [facility] Cell: [phone] Pager: [phone]  10/22/2020, 4:21 PM

## 2020-10-22 NOTE — Progress Notes (Signed)
Progress Note   Subjective  Patient and nursing staff state he has not had any bowel movements overnight or yesterday, no bleeding symptoms reported.    Objective   Vital signs in last 24 hours: Temp:  [97.6 F (36.4 C)] 97.6 F (36.4 C) (03/24 0410) Pulse Rate:  [68-70] 69 (03/24 1117) Resp:  [16-21] 21 (03/24 1117) BP: (133-152)/(68-72) 133/68 (03/24 1117) SpO2:  [97 %-100 %] 97 % (03/24 1117) Last BM Date: 10/05/2020 General:    white male in NAD Abdomen:  Soft, nontender and nondistended.  Psych:  Cooperative. Normal mood and affect.  Intake/Output from previous day: 03/23 0701 - 03/24 0700 In: 700 [P.O.:600; IV Piggyback:100] Out: 1365 [Urine:1365] Intake/Output this shift: Total I/O In: 113.3 [IV Piggyback:113.3] Out: 450 [Urine:450]  Lab Results: Recent Labs    10/25/2020 1921 10/21/20 0238 10/21/20 1636 10/22/20 0056  WBC 7.0 8.4  --  7.1  HGB 9.4* 7.8* 9.8* 9.1*  HCT 29.0* 24.0* 30.2* 28.3*  PLT 124* 145*  --  149*   BMET Recent Labs    10/22/2020 2352 10/21/20 0238 10/22/20 0056  NA 132* 135 131*  K 3.5 3.4* 4.2  CL 99 105 104  CO2 23 20* 18*  GLUCOSE 104* 234* 209*  BUN 39* 46* 49*  CREATININE 1.29* 1.37* 1.28*  CALCIUM 9.2 9.4 9.4   LFT Recent Labs    10/22/20 0056  PROT 5.2*  ALBUMIN 1.8*  AST 29  ALT 94*  ALKPHOS 54  BILITOT 1.0   PT/INR No results for input(s): LABPROT, INR in the last 72 hours.  Studies/Results: CT SHOULDER RIGHT WO CONTRAST  Result Date: 10/21/2020 CLINICAL DATA:  Shoulder trauma EXAM: CT OF THE UPPER RIGHT EXTREMITY WITHOUT CONTRAST TECHNIQUE: Multidetector CT imaging of the upper right extremity was performed according to the standard protocol. COMPARISON:  None. FINDINGS: Bones/Joint/Cartilage No fracture or dislocation. There is mild glenohumeral joint osteoarthritis with joint space loss. Mild AC joint arthrosis is noted. A small joint effusion is present. Ligaments Suboptimally assessed by CT. Muscles  and Tendons Probable partial tear of the superior subscapularis and anterior supraspinatus is noted. There is mild overlying soft tissue swelling. The infraspinatus and teres minor tendons do appear to be grossly intact. No muscular atrophy is seen. Soft tissues Mild soft tissue edema seen along the lateral and posterior aspect of the shoulder. Scattered vascular calcifications are noted. A small right pleural effusion with patchy airspace opacity at the right lung base is noted IMPRESSION: No acute osseous abnormality Probable partial tear of the superior subscapularis and anterior supraspinatus tendons, however somewhat limited due to technique Small glenohumeral joint effusion Mild glenohumeral joint osteoarthritis Small right pleural effusion and adjacent patchy airspace opacity at the right lung base Electronically Signed   By: Prudencio Pair M.D.   On: 10/21/2020 17:55   ECHOCARDIOGRAM COMPLETE  Result Date: 10/21/2020    ECHOCARDIOGRAM REPORT   Patient Name:   Timothy Neal Date of Exam: 10/21/2020 Medical Rec #:  [id number]    Height:       67.0 in Accession #:    [id number]   Weight:       158.7 lb Date of Birth:  05/18/1941    BSA:          1.833 m Patient Age:    80 years     BP:           130/67 mmHg Patient Gender: M  HR:           70 bpm. Exam Location:  Inpatient Procedure: 2D Echo, Cardiac Doppler and Color Doppler Indications:    Bacteremia R78.81  History:        Patient has no prior history of Echocardiogram examinations.                 Cardiomyopathy and CHF, CAD, Defibrillator and Prior CABG,                 Arrythmias:Atrial Fibrillation; Risk Factors:Dyslipidemia,                 Diabetes and Sleep Apnea. CKD.  Sonographer:    Tiffany Dance Referring Phys: [id number] [place]  1. Left ventricular ejection fraction, by estimation, is 25 to 30%. The left ventricle has severely decreased function. The left ventricle demonstrates global hypokinesis. There is moderate  asymmetric left ventricular hypertrophy of the basal-septal segment. Left ventricular diastolic parameters are indeterminate.  2. Right ventricular systolic function is moderately reduced. The right ventricular size is mildly enlarged. There is moderately elevated pulmonary artery systolic pressure. The estimated right ventricular systolic pressure is 42.5 mmHg.  3. Left atrial size was severely dilated.  4. Right atrial size was severely dilated.  5. The mitral valve is abnormal. Moderate mitral valve regurgitation. Moderate mitral stenosis. MG 6 mmHg at HR 70 bpm, MVA 1.2 cm^2 by continuity equation. Severe mitral annular calcification.  6. Tricuspid valve regurgitation is mild to moderate.  7. The aortic valve is tricuspid. There is moderate calcification of the aortic valve. Aortic valve regurgitation is moderate. Mild to moderate aortic valve stenosis. AVA (1.3 cm^2) and DI (0.4) suggest moderate AS, but MG only 35mmHg likely due to systolic dysfunction  8. The inferior vena cava is dilated in size with >50% respiratory variability, suggesting right atrial pressure of 8 mmHg.  9. Evidence of atrial level shunting detected by color flow Doppler, suggests PFO. Conclusion(s)/Recommendation(s): No vegetation seen. If clinical suspicion for endocarditis, recommend TEE. FINDINGS  Left Ventricle: Left ventricular ejection fraction, by estimation, is 25 to 30%. The left ventricle has severely decreased function. The left ventricle demonstrates global hypokinesis. The left ventricular internal cavity size was normal in size. There is moderate asymmetric left ventricular hypertrophy of the basal-septal segment. Left ventricular diastolic parameters are indeterminate. Right Ventricle: The right ventricular size is mildly enlarged. Right vetricular wall thickness was not well visualized. Right ventricular systolic function is moderately reduced. There is moderately elevated pulmonary artery systolic pressure. The tricuspid  regurgitant velocity is 3.59 m/s, and with an assumed right atrial pressure of 8 mmHg, the estimated right ventricular systolic pressure is 95.6 mmHg. Left Atrium: Left atrial size was severely dilated. Right Atrium: Right atrial size was severely dilated. Pericardium: There is no evidence of pericardial effusion. Mitral Valve: The mitral valve is abnormal. Severe mitral annular calcification. Moderate mitral valve regurgitation. Moderate mitral valve stenosis. MV peak gradient, 17.3 mmHg. The mean mitral valve gradient is 6.0 mmHg. Tricuspid Valve: The tricuspid valve is normal in structure. Tricuspid valve regurgitation is mild to moderate. Aortic Valve: The aortic valve is tricuspid. There is moderate calcification of the aortic valve. Aortic valve regurgitation is moderate. Aortic regurgitation PHT measures 359 msec. Mild to moderate aortic stenosis is present. Aortic valve mean gradient measures 8.0 mmHg. Aortic valve peak gradient measures 16.5 mmHg. Aortic valve area, by VTI measures 1.31 cm. Pulmonic Valve: The pulmonic valve was grossly normal. Pulmonic valve regurgitation  is not visualized. Aorta: The aortic root and ascending aorta are structurally normal, with no evidence of dilitation. Venous: The inferior vena cava is dilated in size with greater than 50% respiratory variability, suggesting right atrial pressure of 8 mmHg. IAS/Shunts: Evidence of atrial level shunting detected by color flow Doppler.  LEFT VENTRICLE PLAX 2D LVIDd:         4.90 cm  Diastology LVIDs:         4.30 cm  LV e' medial:    2.68 cm/s LV PW:         1.30 cm  LV E/e' medial:  70.5 LV IVS:        1.50 cm  LV e' lateral:   4.81 cm/s LVOT diam:     2.00 cm  LV E/e' lateral: 39.3 LV SV:         51 LV SV Index:   28 LVOT Area:     3.14 cm  RIGHT VENTRICLE            IVC RV Basal diam:  3.70 cm    IVC diam: 2.50 cm RV Mid diam:    2.60 cm RV S prime:     7.83 cm/s TAPSE (M-mode): 1.3 cm LEFT ATRIUM              Index       RIGHT  ATRIUM           Index LA diam:        6.20 cm  3.38 cm/m  RA Area:     30.20 cm LA Vol (A2C):   140.0 ml 76.37 ml/m RA Volume:   108.00 ml 58.92 ml/m LA Vol (A4C):   138.0 ml 75.28 ml/m LA Biplane Vol: 142.0 ml 77.47 ml/m  AORTIC VALVE AV Area (Vmax):    1.23 cm AV Area (Vmean):   1.36 cm AV Area (VTI):     1.31 cm AV Vmax:           203.00 cm/s AV Vmean:          133.000 cm/s AV VTI:            0.390 m AV Peak Grad:      16.5 mmHg AV Mean Grad:      8.0 mmHg LVOT Vmax:         79.40 cm/s LVOT Vmean:        57.400 cm/s LVOT VTI:          0.163 m LVOT/AV VTI ratio: 0.42 AI PHT:            359 msec  AORTA Ao Root diam: 3.40 cm Ao Asc diam:  3.60 cm MITRAL VALVE                 TRICUSPID VALVE MV Area (PHT): 2.78 cm      TR Peak grad:   51.6 mmHg MV Area VTI:   1.21 cm      TR Vmax:        359.00 cm/s MV Peak grad:  17.3 mmHg MV Mean grad:  6.0 mmHg      SHUNTS MV Vmax:       2.08 m/s      Systemic VTI:  0.16 m MV Vmean:      108.0 cm/s    Systemic Diam: 2.00 cm MV Decel Time: 273 msec MR Peak grad:    131.8 mmHg MR Mean grad:    79.5 mmHg MR Vmax:  574.00 cm/s MR Vmean:        416.5 cm/s MR PISA:         2.26 cm MR PISA Eff ROA: 16 mm MR PISA Radius:  0.60 cm MV E velocity: 189.00 cm/s Oswaldo Milian MD Electronically signed by Oswaldo Milian MD Signature Date/Time: 10/21/2020/4:55:55 PM    Final        Assessment / Plan:    80 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   EGD done 3/22 showing large hiatal hernia, cameron lesions, and multiple gastric ulcers. All ulcers clean based without high risk stigmata for bleeding. Biopsies negative for H pylori. He has been on IV PPI. Hgb drifted yesterday but back up to 9s last 2 blood draws without transfusion, I think the 7.8 was likely a spurious value. BUN has gone up, however he has not had any blood per rectum or bleeding symptoms. Given no high risk stigmata for  bleeding and all clean based ulcers, risk for rebleeding based on endoscopic appearance should be low. Given his labs, would continue to monitor closely, keep on IV PPI for now. I think okay to advance diet today as tolerated.  Plan: - monitor for recurrent bleeding, repeat Hgb in the AM - continue IV protonix today. If Hgb remains stable can then transition to PO protonix tomorrow - advance to soft diet today  Call with questions or any overt bleeding.  Abram Cellar, MD [facility]

## 2020-10-22 NOTE — Progress Notes (Signed)
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 Presenting with increased weakness over the last week with anemia as well as dark stools. 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 He normally is able to use a walker to move at least 15 yards per his sister at bedside. Over the last week, he's had increasing overall weakness and lethargy; as well as an acute inability to use his walker or move on his own. She notes that his sacral wound seems worse and may have had some subjective fever. He c/o on shoulder pain, but she notes that he had been putting extra strain on that right shoulder. She believes that he may have injured it during those exercises.  Of note, patient came from [facility].  In the ED, pt was found to have a 3.5 pt drop in his Hgb and he was FOBT positive. GI was consulted. There was concern for worsening BLE weakness. Neurosurgery was consulted. L hand cellulitis was noted. Blood cx noted to grow MRSA. Patient was started on vanc. TRH was called for admission.   Assessment & Plan:   Active Problems:   GIB (gastrointestinal bleeding)   Symptomatic anemia   Heme positive stool   Multiple gastric ulcers   Hiatal hernia   Acute pain of right shoulder   Generalized weakness  #1 MRSA bacteremia/Klebsiella UTI -seen by ID started on daptomycin.  Transthoracic echo -left ventricular ejection fraction 25 to 30%.  Severely decreased function.  Global hypokinesis.  Moderate asymmetric left ventricular hypertrophy of the basal septal segment.  RV systolic function is moderately reduced.  Moderately elevated pulmonary artery systolic  pressure.  Moderate AR moderate AS Transesophageal echo possibly on Friday. Received cefepime for uti  #2 sacral wound present on admission-care.  Patient has numerous skin lesions to buttocks, unstageable pressure injury to the left ilial tuberosity, weeping areas of partial skin loss to the bilateral lower extremities erythema and skin loss right buttock scrotum and medial thighs, trauma to the left hand and first digit. Wound care notes reviewed. Continue low air loss mattress, bilateral heel pressure redistribution heel boots for pressure related injury and prevention.  Topical care to bilateral lower extremities with Xeroform gauze ABD pad secured with Kerlix with daily changes. Blood-filled blister in the left hand and first digit painted with Betadine until it dries  Cherrelle's Butt cream to the butt lesions. Santyl to unstageable pressure injury.  #3 esophageal candidiasis-on fluconazole, will DC statin due to increased risk of rhabdomyolysis.  #4 upper GI bleed -likely secondary to [facility] lesions in the setting of Eliquis use.  Status post EGD with Lysbeth Galas lesions, esophageal plaques consistent with esophageal candidiasis.  10 nonbleeding gastric ulcers with no stigmata of bleeding  H. pylori testing was done pending.  Continue Protonix 40 mg twice a day. No NSAIDs, restart Eliquis when okay with GI. Hemoglobin 9.1   #5 spinal hemangioma-patient getting radiation as he is a very poor surgical candidate.  #6 protein calorie malnutrition continue and appreciate dietary input.ALB 1.8.  #7 hypokalemia potassium 4.2 magnesium 2.1.  3.4 repleted.   #8 right shoulder pain secondary to partial tear of the subscapularis and supraspinatus tendons.  Small right glenohumeral joint effusion and mild joint arthritis.   Estimated body mass index is 24.86 kg/m as calculated from the following:   Height as of this encounter: 5\' 7"  (1.702 m).   Weight as of this encounter: 72 kg.  DVT  prophylaxis: none GI bleed Code Status: Partial code  family Communication:kevin nephew [phone]  disposition Plan:  Status is: Inpatient   Dispo: The patient is from: assisted living              Anticipated d/c is to: SNF              Patient currently is not medically stable to d/c.   Difficult to place patient na   Consultants: GI neurosurgery infectious disease  Procedures:   Antimicrobials   Subjective:  Reports having a rough night  Objective: Vitals:   10/21/20 1359 10/21/20 2031 10/22/20 0410 10/22/20 1117  BP: (!) 152/72 (!) 147/72 134/71 133/68  Pulse: 69 70 68 69  Resp: 18 18 16  (!) 21  Temp: 97.6 F (36.4 C) 97.6 F (36.4 C) 97.6 F (36.4 C)   TempSrc: Oral Oral Oral   SpO2: 100% 100% 99% 97%  Weight:      Height:        Intake/Output Summary (Last 24 hours) at 10/22/2020 1133 Last data filed at 10/22/2020 0500 Gross per 24 hour  Intake 360 ml  Output 1140 ml  Net -780 ml   Filed Weights   10/12/2020 2100  Weight: 72 kg    Examination:  General exam: frail elderly male in distress due to pain and discomfort  Respiratory system: Coarse breath sounds and rhonchi  to auscultation. Respiratory effort normal. Cardiovascular system: S1 & S2 heard, RRR. No JVD, murmurs, rubs, gallops or clicks. No pedal edema. Gastrointestinal system: Abdomen is nondistended, soft and nontender. No organomegaly or masses felt. Normal bowel sounds heard. Central nervous system: 3 x 5 in both lower extremities extremities: 1+ edema Skin: Multiple excoriated lesions in the lower extremities and multiple decubitus lesions in the sacrum and scrotum and medial thighs.   Index finger with a small blood-filled blister from from injury during nail cutting. Psychiatry: Judgement and insight appear normal. Mood & affect appropriate.     Data Reviewed: I have personally reviewed following labs and imaging studies  CBC: Recent Labs  Lab 09/30/2020 1013 10/25/2020 1537  10/07/2020 2352 10/03/2020 1420 10/29/2020 1921 10/21/20 0238 10/21/20 1636 10/22/20 0056  WBC 5.8  --  5.7 7.1 7.0 8.4  --  7.1  NEUTROABS 5.3  --   --   --   --  7.8*  --   --   HGB 8.2*   < > 8.2* 8.8* 9.4* 7.8* 9.8* 9.1*  HCT 24.8*   < > 24.5* 27.2* 29.0* 24.0* 30.2* 28.3*  MCV 88.3  --  88.1 89.8 90.9 90.6  --  91.6  PLT 106*  --  109* 125* 124* 145*  --  149*   < > = values in this interval not displayed.   Basic Metabolic Panel: Recent Labs  Lab 10/27/2020 1013 10/07/2020 2352 10/21/20 0238 10/22/20 0056  NA 134* 132* 135 131*  K 3.6 3.5 3.4* 4.2  CL 100 99 105 104  CO2 27 23 20* 18*  GLUCOSE 121* 104* 234* 209*  BUN 40* 39* 46* 49*  CREATININE 1.38* 1.29* 1.37* 1.28*  CALCIUM 9.4 9.2 9.4 9.4  MG  --   --  2.1  --  GFR: Estimated Creatinine Clearance: 44.5 mL/min (A) (by C-G formula based on SCr of 1.28 mg/dL (H)). Liver Function Tests: Recent Labs  Lab 10/23/2020 1013 10/24/2020 2352 10/22/20 0056  AST 32 32 29  ALT 69* 69* 94*  ALKPHOS 43 44 54  BILITOT 0.9 1.0 1.0  PROT 4.9* 4.9* 5.2*  ALBUMIN 1.7* 1.7* 1.8*   No results for input(s): LIPASE, AMYLASE in the last 168 hours. No results for input(s): AMMONIA in the last 168 hours. Coagulation Profile: Recent Labs  Lab 10/08/2020 1013  INR 2.2*   Cardiac Enzymes: Recent Labs  Lab 10/21/20 0238  CKTOTAL 16*   BNP (last 3 results) No results for input(s): PROBNP in the last 8760 hours. HbA1C: No results for input(s): HGBA1C in the last 72 hours. CBG: Recent Labs  Lab 10/21/20 0750 10/21/20 1144 10/21/20 1652 10/21/20 2035 10/22/20 0752  GLUCAP 218* 299* 234* 258* 175*   Lipid Profile: No results for input(s): CHOL, HDL, LDLCALC, TRIG, CHOLHDL, LDLDIRECT in the last 72 hours. Thyroid Function Tests: No results for input(s): TSH, T4TOTAL, FREET4, T3FREE, THYROIDAB in the last 72 hours. Anemia Panel: No results for input(s): VITAMINB12, FOLATE, FERRITIN, TIBC, IRON, RETICCTPCT in the last 72  hours. Sepsis Labs: Recent Labs  Lab 10/22/2020 1013 10/27/2020 1213  LATICACIDVEN 1.0 1.1    Recent Results (from the past 240 hour(s))  Blood Culture (routine x 2)     Status: Abnormal   Collection Time: 10/06/2020 10:00 AM   Specimen: BLOOD LEFT HAND  Result Value Ref Range Status   Specimen Description   Final    BLOOD LEFT HAND Performed at [facility], [place] [address], [place], [place] [address]    Special Requests   Final    BOTTLES DRAWN AEROBIC AND ANAEROBIC Blood Culture results may not be optimal due to an inadequate volume of blood received in culture bottles Performed at [facility] [address] [place], [place] [address]    Culture  Setup Time   Final    IN BOTH AEROBIC AND ANAEROBIC BOTTLES GRAM POSITIVE COCCI CRITICAL VALUE NOTED.  VALUE IS CONSISTENT WITH PREVIOUSLY REPORTED AND CALLED VALUE.    Culture (A)  Final    STAPHYLOCOCCUS AUREUS SUSCEPTIBILITIES PERFORMED ON PREVIOUS CULTURE WITHIN THE LAST 5 DAYS. Performed at [facility], [place] [address], [place], [place] [address]    Report Status 10/22/2020 FINAL  Final  Resp Panel by RT-PCR (Flu A&B, Covid) Nasopharyngeal Swab     Status: None   Collection Time: 10/03/2020 10:13 AM   Specimen: Nasopharyngeal Swab; Nasopharyngeal(NP) swabs in vial transport medium  Result Value Ref Range Status   SARS Coronavirus 2 by RT PCR NEGATIVE NEGATIVE Final    Comment: (NOTE) SARS-CoV-2 target nucleic acids are NOT DETECTED.  The SARS-CoV-2 RNA is generally detectable in upper respiratory specimens during the acute phase of infection. The lowest concentration of SARS-CoV-2 viral copies this assay can detect is 138 copies/mL. A negative result does not preclude SARS-Cov-2 infection and should not be used as the sole basis for treatment or other patient management decisions. A negative result may occur with  improper specimen collection/handling, submission of specimen  other than nasopharyngeal swab, presence of viral mutation(s) within the areas targeted by this assay, and inadequate number of viral copies(<138 copies/mL). A negative result must be combined with clinical observations, patient history, and epidemiological information. The expected result is Negative.  Fact Sheet for Patients:  EntrepreneurPulse.com.au  Fact Sheet  for Healthcare Providers:  IncredibleEmployment.be  This test is no t yet approved or cleared by the [place] and  has been authorized for detection and/or diagnosis of SARS-CoV-2 by FDA under an Emergency Use Authorization (EUA). This EUA will remain  in effect (meaning this test can be used) for the duration of the COVID-19 declaration under Section 564(b)(1) of the Act, 21 U.S.C.section 360bbb-3(b)(1), unless the authorization is terminated  or revoked sooner.       Influenza A by PCR NEGATIVE NEGATIVE Final   Influenza B by PCR NEGATIVE NEGATIVE Final    Comment: (NOTE) The Xpert Xpress SARS-CoV-2/FLU/RSV plus assay is intended as an aid in the diagnosis of influenza from Nasopharyngeal swab specimens and should not be used as a sole basis for treatment. Nasal washings and aspirates are unacceptable for Xpert Xpress SARS-CoV-2/FLU/RSV testing.  Fact Sheet for Patients: EntrepreneurPulse.com.au  Fact Sheet for Healthcare Providers: IncredibleEmployment.be  This test is not yet approved or cleared by the [place] FDA and has been authorized for detection and/or diagnosis of SARS-CoV-2 by FDA under an Emergency Use Authorization (EUA). This EUA will remain in effect (meaning this test can be used) for the duration of the COVID-19 declaration under Section 564(b)(1) of the Act, 21 U.S.C. section 360bbb-3(b)(1), unless the authorization is terminated or revoked.  Performed at [facility], [place] [address] [place], [place] [address]   Blood Culture (routine x 2)     Status: Abnormal   Collection Time: 10/08/2020 10:15 AM   Specimen: BLOOD  Result Value Ref Range Status   Specimen Description   Final    BLOOD LEFT ANTECUBITAL Performed at [facility] [address], [place], [place] [address]    Special Requests   Final    BOTTLES DRAWN AEROBIC AND ANAEROBIC Blood Culture results may not be optimal due to an inadequate volume of blood received in culture bottles Performed at [facility] [address], [place], [place] [address]    Culture  Setup Time   Final    IN BOTH AEROBIC AND ANAEROBIC BOTTLES GRAM POSITIVE COCCI CRITICAL RESULT CALLED TO, READ BACK BY AND VERIFIED WITHSeleta Rhymes [facility] 10/17/2020 0245 JDW Performed at [facility], [place] [address] [place], [place] [address]    Culture METHICILLIN RESISTANT STAPHYLOCOCCUS AUREUS (A)  Final   Report Status 10/22/2020 FINAL  Final   Organism ID, Bacteria METHICILLIN RESISTANT STAPHYLOCOCCUS AUREUS  Final      Susceptibility   Methicillin resistant staphylococcus aureus - MIC*    CIPROFLOXACIN >=8 RESISTANT Resistant     ERYTHROMYCIN >=8 RESISTANT Resistant     GENTAMICIN <=0.5 SENSITIVE Sensitive     OXACILLIN >=4 RESISTANT Resistant     TETRACYCLINE <=1 SENSITIVE Sensitive     VANCOMYCIN <=0.5 SENSITIVE Sensitive     TRIMETH/SULFA <=10 SENSITIVE Sensitive     CLINDAMYCIN RESISTANT Resistant     RIFAMPIN <=0.5 SENSITIVE Sensitive     Inducible Clindamycin POSITIVE Resistant     * METHICILLIN RESISTANT STAPHYLOCOCCUS AUREUS  Blood Culture ID Panel (Reflexed)     Status: Abnormal   Collection Time: 10/16/2020 10:15 AM  Result Value Ref Range Status   Enterococcus faecalis NOT DETECTED NOT DETECTED Final   Enterococcus Faecium NOT DETECTED NOT DETECTED Final   Listeria monocytogenes NOT DETECTED NOT DETECTED Final   Staphylococcus species DETECTED (A) NOT DETECTED Final    Comment:  CRITICAL RESULT CALLED TO, READ BACK BY  AND VERIFIED WITH: Seleta Rhymes [facility] 10/03/2020 0245 JDW    Staphylococcus aureus (BCID) DETECTED (A) NOT DETECTED Final    Comment: Methicillin (oxacillin)-resistant Staphylococcus aureus (MRSA). MRSA is predictably resistant to beta-lactam antibiotics (except ceftaroline). Preferred therapy is vancomycin unless clinically contraindicated. Patient requires contact precautions if  hospitalized. CRITICAL RESULT CALLED TO, READ BACK BY AND VERIFIED WITH: E JACKSON PHARMD 10/09/2020 0245 JDW    Staphylococcus epidermidis NOT DETECTED NOT DETECTED Final   Staphylococcus lugdunensis NOT DETECTED NOT DETECTED Final   Streptococcus species NOT DETECTED NOT DETECTED Final   Streptococcus agalactiae NOT DETECTED NOT DETECTED Final   Streptococcus pneumoniae NOT DETECTED NOT DETECTED Final   Streptococcus pyogenes NOT DETECTED NOT DETECTED Final   A.calcoaceticus-baumannii NOT DETECTED NOT DETECTED Final   Bacteroides fragilis NOT DETECTED NOT DETECTED Final   Enterobacterales NOT DETECTED NOT DETECTED Final   Enterobacter cloacae complex NOT DETECTED NOT DETECTED Final   Escherichia coli NOT DETECTED NOT DETECTED Final   Klebsiella aerogenes NOT DETECTED NOT DETECTED Final   Klebsiella oxytoca NOT DETECTED NOT DETECTED Final   Klebsiella pneumoniae NOT DETECTED NOT DETECTED Final   Proteus species NOT DETECTED NOT DETECTED Final   Salmonella species NOT DETECTED NOT DETECTED Final   Serratia marcescens NOT DETECTED NOT DETECTED Final   Haemophilus influenzae NOT DETECTED NOT DETECTED Final   Neisseria meningitidis NOT DETECTED NOT DETECTED Final   Pseudomonas aeruginosa NOT DETECTED NOT DETECTED Final   Stenotrophomonas maltophilia NOT DETECTED NOT DETECTED Final   Candida albicans NOT DETECTED NOT DETECTED Final   Candida auris NOT DETECTED NOT DETECTED Final   Candida glabrata NOT DETECTED NOT DETECTED Final   Candida krusei NOT DETECTED NOT DETECTED Final    Candida parapsilosis NOT DETECTED NOT DETECTED Final   Candida tropicalis NOT DETECTED NOT DETECTED Final   Cryptococcus neoformans/gattii NOT DETECTED NOT DETECTED Final   Meth resistant mecA/C and MREJ DETECTED (A) NOT DETECTED Final    Comment: CRITICAL RESULT CALLED TO, READ BACK BY AND VERIFIED WITHSeleta Rhymes [facility] 10/14/2020 0245 JDW Performed at [facility], [address], [place], [place] [address]   Urine culture     Status: Abnormal   Collection Time: 09/30/2020 11:02 AM   Specimen: In/Out Cath Urine  Result Value Ref Range Status   Specimen Description   Final    IN/OUT CATH URINE Performed at [facility] [address] [place], [place] [address]    Special Requests   Final    NONE Performed at [facility], [place] [address] [place],  [address]    Culture >=100,000 COLONIES/mL KLEBSIELLA PNEUMONIAE (A)  Final   Report Status 10/21/2020 FINAL  Final   Organism ID, Bacteria KLEBSIELLA PNEUMONIAE (A)  Final      Susceptibility   Klebsiella pneumoniae - MIC*    AMPICILLIN >=32 RESISTANT Resistant     CEFAZOLIN <=4 SENSITIVE Sensitive     CEFEPIME <=0.12 SENSITIVE Sensitive     CEFTRIAXONE <=0.25 SENSITIVE Sensitive     CIPROFLOXACIN <=0.25 SENSITIVE Sensitive     GENTAMICIN <=1 SENSITIVE Sensitive     IMIPENEM <=0.25 SENSITIVE Sensitive     NITROFURANTOIN 128 RESISTANT Resistant     TRIMETH/SULFA >=320 RESISTANT Resistant     AMPICILLIN/SULBACTAM >=32 RESISTANT Resistant     PIP/TAZO 32 INTERMEDIATE Intermediate     * >=100,000 COLONIES/mL KLEBSIELLA PNEUMONIAE  Culture, blood (routine x 2)     Status: None (Preliminary result)   Collection  Time: 10/21/20  2:38 AM   Specimen: BLOOD  Result Value Ref Range Status   Specimen Description   Final    BLOOD LEFT HAND Performed at [place] [address] [place], [place] [address]    Special Requests   Final    BOTTLES DRAWN AEROBIC ONLY  Blood Culture adequate volume Performed at [facility] [address], [place], [place] [address]    Culture   Final    NO GROWTH < 24 HOURS Performed at [facility] [place] [address] [place], [place] [address]    Report Status PENDING  Incomplete  Culture, blood (routine x 2)     Status: None (Preliminary result)   Collection Time: 10/21/20  2:39 AM   Specimen: BLOOD  Result Value Ref Range Status   Specimen Description   Final    BLOOD BLOOD RIGHT FOREARM Performed at [facility] [address] [place], [place] [address]    Special Requests   Final    BOTTLES DRAWN AEROBIC ONLY Blood Culture results may not be optimal due to an inadequate volume of blood received in culture bottles Performed at [facility] [address], [place], [place] [address]    Culture   Final    NO GROWTH < 24 HOURS Performed at [facility] [address] [place], [place] [address]    Report Status PENDING  Incomplete         Radiology Studies: CT SHOULDER RIGHT WO CONTRAST  Result Date: 10/21/2020 CLINICAL DATA:  Shoulder trauma EXAM: CT OF THE UPPER RIGHT EXTREMITY WITHOUT CONTRAST TECHNIQUE: Multidetector CT imaging of the upper right extremity was performed according to the standard protocol. COMPARISON:  None. FINDINGS: Bones/Joint/Cartilage No fracture or dislocation. There is mild glenohumeral joint osteoarthritis with joint space loss. Mild AC joint arthrosis is noted. A small joint effusion is present. Ligaments Suboptimally assessed by CT. Muscles and Tendons Probable partial tear of the superior subscapularis and anterior supraspinatus is noted. There is mild overlying soft tissue swelling. The infraspinatus and teres minor tendons do appear to be grossly intact. No muscular atrophy is seen. Soft tissues Mild soft tissue edema seen along the lateral and posterior aspect of the shoulder. Scattered vascular  calcifications are noted. A small right pleural effusion with patchy airspace opacity at the right lung base is noted IMPRESSION: No acute osseous abnormality Probable partial tear of the superior subscapularis and anterior supraspinatus tendons, however somewhat limited due to technique Small glenohumeral joint effusion Mild glenohumeral joint osteoarthritis Small right pleural effusion and adjacent patchy airspace opacity at the right lung base Electronically Signed   By: Prudencio Pair M.D.   On: 10/21/2020 17:55   ECHOCARDIOGRAM COMPLETE  Result Date: 10/21/2020    ECHOCARDIOGRAM REPORT   Patient Name:   MANSUR PATTI Date of Exam: 10/21/2020 Medical Rec #:  [id number]    Height:       67.0 in Accession #:    [id number]   Weight:       158.7 lb Date of Birth:  [birth year]    BSA:          1.833 m Patient Age:    13 years     BP:           130/67 mmHg Patient Gender: M            HR:           70  bpm. Exam Location:  Inpatient Procedure: 2D Echo, Cardiac Doppler and Color Doppler Indications:    Bacteremia R78.81  History:        Patient has no prior history of Echocardiogram examinations.                 Cardiomyopathy and CHF, CAD, Defibrillator and Prior CABG,                 Arrythmias:Atrial Fibrillation; Risk Factors:Dyslipidemia,                 Diabetes and Sleep Apnea. CKD.  Sonographer:    Tiffany Dance Referring Phys: [id number] [place]  1. Left ventricular ejection fraction, by estimation, is 25 to 30%. The left ventricle has severely decreased function. The left ventricle demonstrates global hypokinesis. There is moderate asymmetric left ventricular hypertrophy of the basal-septal segment. Left ventricular diastolic parameters are indeterminate.  2. Right ventricular systolic function is moderately reduced. The right ventricular size is mildly enlarged. There is moderately elevated pulmonary artery systolic pressure. The estimated right ventricular systolic pressure is 66.5 mmHg.   3. Left atrial size was severely dilated.  4. Right atrial size was severely dilated.  5. The mitral valve is abnormal. Moderate mitral valve regurgitation. Moderate mitral stenosis. MG 6 mmHg at HR 70 bpm, MVA 1.2 cm^2 by continuity equation. Severe mitral annular calcification.  6. Tricuspid valve regurgitation is mild to moderate.  7. The aortic valve is tricuspid. There is moderate calcification of the aortic valve. Aortic valve regurgitation is moderate. Mild to moderate aortic valve stenosis. AVA (1.3 cm^2) and DI (0.4) suggest moderate AS, but MG only 33mmHg likely due to systolic dysfunction  8. The inferior vena cava is dilated in size with >50% respiratory variability, suggesting right atrial pressure of 8 mmHg.  9. Evidence of atrial level shunting detected by color flow Doppler, suggests PFO. Conclusion(s)/Recommendation(s): No vegetation seen. If clinical suspicion for endocarditis, recommend TEE. FINDINGS  Left Ventricle: Left ventricular ejection fraction, by estimation, is 25 to 30%. The left ventricle has severely decreased function. The left ventricle demonstrates global hypokinesis. The left ventricular internal cavity size was normal in size. There is moderate asymmetric left ventricular hypertrophy of the basal-septal segment. Left ventricular diastolic parameters are indeterminate. Right Ventricle: The right ventricular size is mildly enlarged. Right vetricular wall thickness was not well visualized. Right ventricular systolic function is moderately reduced. There is moderately elevated pulmonary artery systolic pressure. The tricuspid regurgitant velocity is 3.59 m/s, and with an assumed right atrial pressure of 8 mmHg, the estimated right ventricular systolic pressure is 99.3 mmHg. Left Atrium: Left atrial size was severely dilated. Right Atrium: Right atrial size was severely dilated. Pericardium: There is no evidence of pericardial effusion. Mitral Valve: The mitral valve is abnormal. Severe  mitral annular calcification. Moderate mitral valve regurgitation. Moderate mitral valve stenosis. MV peak gradient, 17.3 mmHg. The mean mitral valve gradient is 6.0 mmHg. Tricuspid Valve: The tricuspid valve is normal in structure. Tricuspid valve regurgitation is mild to moderate. Aortic Valve: The aortic valve is tricuspid. There is moderate calcification of the aortic valve. Aortic valve regurgitation is moderate. Aortic regurgitation PHT measures 359 msec. Mild to moderate aortic stenosis is present. Aortic valve mean gradient measures 8.0 mmHg. Aortic valve peak gradient measures 16.5 mmHg. Aortic valve area, by VTI measures 1.31 cm. Pulmonic Valve: The pulmonic valve was grossly normal. Pulmonic valve regurgitation is not visualized. Aorta: The aortic root and ascending aorta are structurally  normal, with no evidence of dilitation. Venous: The inferior vena cava is dilated in size with greater than 50% respiratory variability, suggesting right atrial pressure of 8 mmHg. IAS/Shunts: Evidence of atrial level shunting detected by color flow Doppler.  LEFT VENTRICLE PLAX 2D LVIDd:         4.90 cm  Diastology LVIDs:         4.30 cm  LV e' medial:    2.68 cm/s LV PW:         1.30 cm  LV E/e' medial:  70.5 LV IVS:        1.50 cm  LV e' lateral:   4.81 cm/s LVOT diam:     2.00 cm  LV E/e' lateral: 39.3 LV SV:         51 LV SV Index:   28 LVOT Area:     3.14 cm  RIGHT VENTRICLE            IVC RV Basal diam:  3.70 cm    IVC diam: 2.50 cm RV Mid diam:    2.60 cm RV S prime:     7.83 cm/s TAPSE (M-mode): 1.3 cm LEFT ATRIUM              Index       RIGHT ATRIUM           Index LA diam:        6.20 cm  3.38 cm/m  RA Area:     30.20 cm LA Vol (A2C):   140.0 ml 76.37 ml/m RA Volume:   108.00 ml 58.92 ml/m LA Vol (A4C):   138.0 ml 75.28 ml/m LA Biplane Vol: 142.0 ml 77.47 ml/m  AORTIC VALVE AV Area (Vmax):    1.23 cm AV Area (Vmean):   1.36 cm AV Area (VTI):     1.31 cm AV Vmax:           203.00 cm/s AV Vmean:           133.000 cm/s AV VTI:            0.390 m AV Peak Grad:      16.5 mmHg AV Mean Grad:      8.0 mmHg LVOT Vmax:         79.40 cm/s LVOT Vmean:        57.400 cm/s LVOT VTI:          0.163 m LVOT/AV VTI ratio: 0.42 AI PHT:            359 msec  AORTA Ao Root diam: 3.40 cm Ao Asc diam:  3.60 cm MITRAL VALVE                 TRICUSPID VALVE MV Area (PHT): 2.78 cm      TR Peak grad:   51.6 mmHg MV Area VTI:   1.21 cm      TR Vmax:        359.00 cm/s MV Peak grad:  17.3 mmHg MV Mean grad:  6.0 mmHg      SHUNTS MV Vmax:       2.08 m/s      Systemic VTI:  0.16 m MV Vmean:      108.0 cm/s    Systemic Diam: 2.00 cm MV Decel Time: 273 msec MR Peak grad:    131.8 mmHg MR Mean grad:    79.5 mmHg MR Vmax:         574.00 cm/s MR Vmean:  416.5 cm/s MR PISA:         2.26 cm MR PISA Eff ROA: 16 mm MR PISA Radius:  0.60 cm MV E velocity: 189.00 cm/s Oswaldo Milian MD Electronically signed by Oswaldo Milian MD Signature Date/Time: 10/21/2020/4:55:55 PM    Final         Scheduled Meds: . carvedilol  12.5 mg Oral BID  . collagenase   Topical Daily  . dexamethasone  12 mg Oral Q8H  . digoxin  0.125 mg Oral Daily  . feeding supplement  237 mL Oral BID BM  . ferrous sulfate  325 mg Oral Q breakfast  . finasteride  5 mg Oral QHS  . fluconazole  100 mg Oral Daily  . Gerhardt's butt cream   Topical TID  . insulin aspart  0-15 Units Subcutaneous TID WC  . insulin aspart  0-5 Units Subcutaneous QHS  . multivitamin with minerals  1 tablet Oral Daily  . nutrition supplement (JUVEN)  1 packet Oral BID BM  . pantoprazole (PROTONIX) IV  40 mg Intravenous Q12H  . sotalol  80 mg Oral Daily   And  . sotalol  120 mg Oral QHS  . tamsulosin  0.4 mg Oral Daily   Continuous Infusions: . DAPTOmycin (CUBICIN)  IV 650 mg (10/21/20 2046)     LOS: 1 day   Georgette Shell, MD 10/22/2020, 11:33 AM

## 2020-10-22 NOTE — Progress Notes (Signed)
Initial Nutrition Assessment  INTERVENTION:   -Ensure Enlive po BID, each supplement provides 350 kcal and 20 grams of protein  -Juven Fruit Punch BID, each serving provides 95kcal and 2.5g of protein (amino acids glutamine and arginine)  -Multivitamin with minerals daily  NUTRITION DIAGNOSIS:   Increased nutrient needs related to wound healing as evidenced by estimated needs.  GOAL:   Patient will meet greater than or equal to 90% of their needs  MONITOR:   PO intake,Supplement acceptance,Labs,Weight trends,I & O's,Skin  REASON FOR ASSESSMENT:   Consult Assessment of nutrition requirement/status,Wound healing,Poor PO  ASSESSMENT:   80 y.o. 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 Presenting with increased weakness over the last week with anemia as well as dark stools. 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 Admitted for GIB.  3/22: s/p EGD -revealed hiatal hernia, multiple gastric ulcers, esophageal candidiasis  Patiently currently undergoing radiation for thoracic spine hemangioma. Pt with multiple skin wounds and issues.  Pt has been eating pretty well, consumed 75-100% of meals yesterday (estimated total of 970 kcals and 44g protein consumed). Will order Ensure and Juven supplements to aid in wound healing.  No weight loss noted in weight records.    Medications: Ferrous sulfate  Labs reviewed:  CBGs:175-258 Low Na  NUTRITION - FOCUSED PHYSICAL EXAM:  Unable to complete  Diet Order:   Diet Order            DIET SOFT Room service appropriate? Yes; Fluid consistency: Thin  Diet effective now                 EDUCATION NEEDS:   No education needs have been identified at this time  Skin:  Skin Assessment: Skin Integrity Issues: Skin Integrity Issues:: Unstageable,Other  (Comment) Unstageable: L IT -per WOC note Other: multiple scattered wounds on buttocks, weeping areas of skin loss to bilateral LEs, trauma to left hand -per WOC note  Last BM:  3/22  Height:   Ht Readings from Last 1 Encounters:  10/29/2020 5\' 7"  (1.702 m)    Weight:   Wt Readings from Last 1 Encounters:  10/01/2020 72 kg   BMI:  Body mass index is 24.86 kg/m.  Estimated Nutritional Needs:   Kcal:  2100- 2300  Protein:  90-105g  Fluid:  2.1L/day  Timothy Bibles, MS, RD, LDN Inpatient Clinical Dietitian Contact information available via Amion

## 2020-10-23 ENCOUNTER — Encounter: Payer: Self-pay | Admitting: Radiation Oncology

## 2020-10-23 ENCOUNTER — Encounter (HOSPITAL_COMMUNITY): Payer: Self-pay | Admitting: Anesthesiology

## 2020-10-23 ENCOUNTER — Encounter (HOSPITAL_COMMUNITY): Admission: EM | Disposition: E | Payer: Self-pay | Source: Home / Self Care | Attending: Internal Medicine

## 2020-10-23 ENCOUNTER — Ambulatory Visit: Payer: Medicare PPO

## 2020-10-23 ENCOUNTER — Other Ambulatory Visit (HOSPITAL_COMMUNITY): Payer: Medicare PPO

## 2020-10-23 DIAGNOSIS — D649 Anemia, unspecified: Secondary | ICD-10-CM | POA: Diagnosis not present

## 2020-10-23 DIAGNOSIS — K449 Diaphragmatic hernia without obstruction or gangrene: Secondary | ICD-10-CM

## 2020-10-23 DIAGNOSIS — Z7189 Other specified counseling: Secondary | ICD-10-CM

## 2020-10-23 DIAGNOSIS — M25511 Pain in right shoulder: Secondary | ICD-10-CM | POA: Diagnosis not present

## 2020-10-23 DIAGNOSIS — Z515 Encounter for palliative care: Secondary | ICD-10-CM

## 2020-10-23 DIAGNOSIS — R7881 Bacteremia: Secondary | ICD-10-CM | POA: Diagnosis not present

## 2020-10-23 DIAGNOSIS — R531 Weakness: Secondary | ICD-10-CM | POA: Diagnosis not present

## 2020-10-23 DIAGNOSIS — L02512 Cutaneous abscess of left hand: Secondary | ICD-10-CM | POA: Diagnosis not present

## 2020-10-23 DIAGNOSIS — R7401 Elevation of levels of liver transaminase levels: Secondary | ICD-10-CM

## 2020-10-23 DIAGNOSIS — N390 Urinary tract infection, site not specified: Secondary | ICD-10-CM | POA: Diagnosis not present

## 2020-10-23 DIAGNOSIS — K259 Gastric ulcer, unspecified as acute or chronic, without hemorrhage or perforation: Secondary | ICD-10-CM | POA: Diagnosis not present

## 2020-10-23 DIAGNOSIS — M549 Dorsalgia, unspecified: Secondary | ICD-10-CM

## 2020-10-23 DIAGNOSIS — D1809 Hemangioma of other sites: Secondary | ICD-10-CM | POA: Diagnosis not present

## 2020-10-23 DIAGNOSIS — Z66 Do not resuscitate: Secondary | ICD-10-CM

## 2020-10-23 DIAGNOSIS — K254 Chronic or unspecified gastric ulcer with hemorrhage: Secondary | ICD-10-CM | POA: Diagnosis not present

## 2020-10-23 DIAGNOSIS — B961 Klebsiella pneumoniae [K. pneumoniae] as the cause of diseases classified elsewhere: Secondary | ICD-10-CM | POA: Diagnosis not present

## 2020-10-23 DIAGNOSIS — R195 Other fecal abnormalities: Secondary | ICD-10-CM | POA: Diagnosis not present

## 2020-10-23 LAB — COMPREHENSIVE METABOLIC PANEL
ALT: 85 U/L — ABNORMAL HIGH (ref 0–44)
AST: 25 U/L (ref 15–41)
Albumin: 1.8 g/dL — ABNORMAL LOW (ref 3.5–5.0)
Alkaline Phosphatase: 53 U/L (ref 38–126)
Anion gap: 8 (ref 5–15)
BUN: 60 mg/dL — ABNORMAL HIGH (ref 8–23)
CO2: 19 mmol/L — ABNORMAL LOW (ref 22–32)
Calcium: 9.4 mg/dL (ref 8.9–10.3)
Chloride: 104 mmol/L (ref 98–111)
Creatinine, Ser: 1.26 mg/dL — ABNORMAL HIGH (ref 0.61–1.24)
GFR, Estimated: 58 mL/min — ABNORMAL LOW (ref >60)
Glucose, Bld: 260 mg/dL — ABNORMAL HIGH (ref 70–99)
Potassium: 4.4 mmol/L (ref 3.5–5.1)
Sodium: 131 mmol/L — ABNORMAL LOW (ref 135–145)
Total Bilirubin: 1 mg/dL (ref 0.3–1.2)
Total Protein: 5.2 g/dL — ABNORMAL LOW (ref 6.5–8.1)

## 2020-10-23 LAB — GLUCOSE, CAPILLARY
Glucose-Capillary: 220 mg/dL — ABNORMAL HIGH (ref 70–99)
Glucose-Capillary: 195 mg/dL — ABNORMAL HIGH (ref 70–99)

## 2020-10-23 LAB — CBC
HCT: 28.5 % — ABNORMAL LOW (ref 39.0–52.0)
Hemoglobin: 9.2 g/dL — ABNORMAL LOW (ref 13.0–17.0)
MCH: 28.9 pg (ref 26.0–34.0)
MCHC: 32.3 g/dL (ref 30.0–36.0)
MCV: 89.6 fL (ref 80.0–100.0)
Platelets: 162 10*3/uL (ref 150–400)
RBC: 3.18 MIL/uL — ABNORMAL LOW (ref 4.22–5.81)
RDW: 18.1 % — ABNORMAL HIGH (ref 11.5–15.5)
WBC: 9 10*3/uL (ref 4.0–10.5)
nRBC: 0 % (ref 0.0–0.2)

## 2020-10-23 SURGERY — CANCELLED PROCEDURE

## 2020-10-23 MED ORDER — CHLORHEXIDINE GLUCONATE CLOTH 2 % EX PADS
6.0000 | MEDICATED_PAD | Freq: Every day | CUTANEOUS | Status: DC
  Administered 2020-10-23 – 2020-10-25 (×3): 6 via TOPICAL

## 2020-10-23 MED ORDER — MORPHINE SULFATE (CONCENTRATE) 10 MG/0.5ML PO SOLN
5.0000 mg | ORAL | Status: DC | PRN
Start: 2020-10-23 — End: 2020-10-26

## 2020-10-23 MED ORDER — HALOPERIDOL LACTATE 5 MG/ML IJ SOLN: 0.5000 mg | INTRAMUSCULAR | Status: DC | PRN

## 2020-10-23 MED ORDER — POLYVINYL ALCOHOL 1.4 % OP SOLN
1.0000 [drp] | Freq: Four times a day (QID) | OPHTHALMIC | Status: DC | PRN
  Filled 2020-10-23: qty 15

## 2020-10-23 MED ORDER — BIOTENE DRY MOUTH MT LIQD: 15.0000 mL | OROMUCOSAL | Status: DC | PRN

## 2020-10-23 MED ORDER — PANTOPRAZOLE SODIUM 40 MG PO TBEC
40.0000 mg | DELAYED_RELEASE_TABLET | Freq: Two times a day (BID) | ORAL | Status: DC
  Administered 2020-10-23 – 2020-10-24 (×3): 40 mg via ORAL
  Filled 2020-10-23 (×3): qty 1

## 2020-10-23 MED ORDER — INSULIN GLARGINE 100 UNIT/ML ~~LOC~~ SOLN: 10.0000 [IU] | Freq: Every day | SUBCUTANEOUS | Status: DC

## 2020-10-23 MED ORDER — DEXAMETHASONE 4 MG PO TABS
12.0000 mg | ORAL_TABLET | Freq: Two times a day (BID) | ORAL | Status: DC
  Administered 2020-10-23 – 2020-10-24 (×2): 12 mg via ORAL
  Filled 2020-10-23 (×2): qty 3

## 2020-10-23 MED ORDER — GLYCOPYRROLATE 0.2 MG/ML IJ SOLN
0.2000 mg | INTRAMUSCULAR | Status: DC | PRN
  Administered 2020-10-26: 0.2 mg via INTRAVENOUS
  Filled 2020-10-23: qty 1

## 2020-10-23 MED ORDER — MORPHINE SULFATE (PF) 2 MG/ML IV SOLN
1.0000 mg | INTRAVENOUS | Status: DC | PRN
  Administered 2020-10-26: 1 mg via INTRAVENOUS
  Filled 2020-10-23: qty 1

## 2020-10-23 NOTE — Care Management Important Message (Signed)
Important Message  Patient Details IM Letter placed in placed in Patient's door caddy Name: Timothy Neal MRN: [id number] Date of Birth: 02-11-1941   Medicare Important Message Given:  Yes     Kerin Salen 09/30/2020, 10:46 AM

## 2020-10-23 NOTE — Anesthesia Preprocedure Evaluation (Deleted)
Anesthesia Evaluation  Patient identified by MRN, date of birth, ID band Patient awake    Reviewed: Allergy & Precautions, H&P , NPO status , Patient's Chart, lab work & pertinent test results  Airway Mallampati: II   Neck ROM: full    Dental   Pulmonary former smoker,    breath sounds clear to auscultation       Cardiovascular + CAD and +CHF  + Valvular Problems/Murmurs MR  Rhythm:regular Rate:Normal  TTE: EF 30%, moderate MR, moderate MS.  Dilated LA and RA. Moderately reduced RV function.   Neuro/Psych    GI/Hepatic PUD,   Endo/Other  diabetes, Type 2Hypothyroidism   Renal/GU Renal InsufficiencyRenal disease     Musculoskeletal   Abdominal   Peds  Hematology   Anesthesia Other Findings   Reproductive/Obstetrics                             Anesthesia Physical Anesthesia Plan  ASA: III  Anesthesia Plan: MAC   Post-op Pain Management:    Induction: Intravenous  PONV Risk Score and Plan: 1 and Propofol infusion and Treatment may vary due to age or medical condition  Airway Management Planned: Nasal Cannula  Additional Equipment:   Intra-op Plan:   Post-operative Plan:   Informed Consent: I have reviewed the patients History and Physical, chart, labs and discussed the procedure including the risks, benefits and alternatives for the proposed anesthesia with the patient or authorized representative who has indicated his/her understanding and acceptance.     Dental advisory given  Plan Discussed with: CRNA, Anesthesiologist and Surgeon  Anesthesia Plan Comments:         Anesthesia Quick Evaluation

## 2020-10-23 NOTE — Progress Notes (Signed)
Progress Note   Subjective  Patient denies any bleeding symptoms. Nursing reports he was having some loose stools but nothing that looked bloody. No abdominal pain. He is scheduled for TEE today but drinking liquids upon evaluation.   Objective   Vital signs in last 24 hours: Temp:  [97.7 F (36.5 C)-98.2 F (36.8 C)] 98.2 F (36.8 C) (03/25 0549) Pulse Rate:  [69-70] 69 (03/25 0549) Resp:  [18-21] 20 (03/25 0549) BP: (133-156)/(68-77) 152/68 (03/25 0549) SpO2:  [90 %-100 %] 90 % (03/25 0549) Last BM Date: 10/10/2020 General:    white male in NAD Abdomen:  Soft, nontender and nondistended.  Psych:  Cooperative. Normal mood and affect.  Intake/Output from previous day: 03/24 0701 - 03/25 0700 In: 593.3 [P.O.:480; IV Piggyback:113.3] Out: 1800 [Urine:1800] Intake/Output this shift: No intake/output data recorded.  Lab Results: Recent Labs    10/21/20 0238 10/21/20 1636 10/22/20 0056 10/25/2020 0533  WBC 8.4  --  7.1 9.0  HGB 7.8* 9.8* 9.1* 9.2*  HCT 24.0* 30.2* 28.3* 28.5*  PLT 145*  --  149* 162   BMET Recent Labs    10/21/20 0238 10/22/20 0056 10/25/2020 0533  NA 135 131* 131*  K 3.4* 4.2 4.4  CL 105 104 104  CO2 20* 18* 19*  GLUCOSE 234* 209* 260*  BUN 46* 49* 60*  CREATININE 1.37* 1.28* 1.26*  CALCIUM 9.4 9.4 9.4   LFT Recent Labs    09/29/2020 0533  PROT 5.2*  ALBUMIN 1.8*  AST 25  ALT 85*  ALKPHOS 53  BILITOT 1.0   PT/INR No results for input(s): LABPROT, INR in the last 72 hours.  Studies/Results: DG Chest 1 View  Result Date: 10/22/2020 CLINICAL DATA:  Cough. EXAM: CHEST  1 VIEW COMPARISON:  October 19, 2020. FINDINGS: Stable cardiomegaly. Status post coronary bypass graft. Left-sided pacemaker is unchanged in position. No pneumothorax or pleural effusion is noted. Lungs are clear. Bony thorax is unremarkable. IMPRESSION: No active disease. Electronically Signed   By: Marijo Conception M.D.   On: 10/22/2020 13:35   CT SHOULDER RIGHT WO  CONTRAST  Result Date: 10/21/2020 CLINICAL DATA:  Shoulder trauma EXAM: CT OF THE UPPER RIGHT EXTREMITY WITHOUT CONTRAST TECHNIQUE: Multidetector CT imaging of the upper right extremity was performed according to the standard protocol. COMPARISON:  None. FINDINGS: Bones/Joint/Cartilage No fracture or dislocation. There is mild glenohumeral joint osteoarthritis with joint space loss. Mild AC joint arthrosis is noted. A small joint effusion is present. Ligaments Suboptimally assessed by CT. Muscles and Tendons Probable partial tear of the superior subscapularis and anterior supraspinatus is noted. There is mild overlying soft tissue swelling. The infraspinatus and teres minor tendons do appear to be grossly intact. No muscular atrophy is seen. Soft tissues Mild soft tissue edema seen along the lateral and posterior aspect of the shoulder. Scattered vascular calcifications are noted. A small right pleural effusion with patchy airspace opacity at the right lung base is noted IMPRESSION: No acute osseous abnormality Probable partial tear of the superior subscapularis and anterior supraspinatus tendons, however somewhat limited due to technique Small glenohumeral joint effusion Mild glenohumeral joint osteoarthritis Small right pleural effusion and adjacent patchy airspace opacity at the right lung base Electronically Signed   By: Prudencio Pair M.D.   On: 10/21/2020 17:55   ECHOCARDIOGRAM COMPLETE  Result Date: 10/21/2020    ECHOCARDIOGRAM REPORT   Patient Name:   Timothy Neal Date of Exam: 10/21/2020 Medical Rec #:  [id number]  Height:       67.0 in Accession #:    [id number]   Weight:       158.7 lb Date of Birth:  April 03, 1941    BSA:          1.833 m Patient Age:    80 years     BP:           130/67 mmHg Patient Gender: M            HR:           70 bpm. Exam Location:  Inpatient Procedure: 2D Echo, Cardiac Doppler and Color Doppler Indications:    Bacteremia R78.81  History:        Patient has no prior history of  Echocardiogram examinations.                 Cardiomyopathy and CHF, CAD, Defibrillator and Prior CABG,                 Arrythmias:Atrial Fibrillation; Risk Factors:Dyslipidemia,                 Diabetes and Sleep Apnea. CKD.  Sonographer:    Tiffany Dance Referring Phys: [id number] [place]  1. Left ventricular ejection fraction, by estimation, is 25 to 30%. The left ventricle has severely decreased function. The left ventricle demonstrates global hypokinesis. There is moderate asymmetric left ventricular hypertrophy of the basal-septal segment. Left ventricular diastolic parameters are indeterminate.  2. Right ventricular systolic function is moderately reduced. The right ventricular size is mildly enlarged. There is moderately elevated pulmonary artery systolic pressure. The estimated right ventricular systolic pressure is 60.0 mmHg.  3. Left atrial size was severely dilated.  4. Right atrial size was severely dilated.  5. The mitral valve is abnormal. Moderate mitral valve regurgitation. Moderate mitral stenosis. MG 6 mmHg at HR 70 bpm, MVA 1.2 cm^2 by continuity equation. Severe mitral annular calcification.  6. Tricuspid valve regurgitation is mild to moderate.  7. The aortic valve is tricuspid. There is moderate calcification of the aortic valve. Aortic valve regurgitation is moderate. Mild to moderate aortic valve stenosis. AVA (1.3 cm^2) and DI (0.4) suggest moderate AS, but MG only 76mmHg likely due to systolic dysfunction  8. The inferior vena cava is dilated in size with >50% respiratory variability, suggesting right atrial pressure of 8 mmHg.  9. Evidence of atrial level shunting detected by color flow Doppler, suggests PFO. Conclusion(s)/Recommendation(s): No vegetation seen. If clinical suspicion for endocarditis, recommend TEE. FINDINGS  Left Ventricle: Left ventricular ejection fraction, by estimation, is 25 to 30%. The left ventricle has severely decreased function. The left  ventricle demonstrates global hypokinesis. The left ventricular internal cavity size was normal in size. There is moderate asymmetric left ventricular hypertrophy of the basal-septal segment. Left ventricular diastolic parameters are indeterminate. Right Ventricle: The right ventricular size is mildly enlarged. Right vetricular wall thickness was not well visualized. Right ventricular systolic function is moderately reduced. There is moderately elevated pulmonary artery systolic pressure. The tricuspid regurgitant velocity is 3.59 m/s, and with an assumed right atrial pressure of 8 mmHg, the estimated right ventricular systolic pressure is 45.9 mmHg. Left Atrium: Left atrial size was severely dilated. Right Atrium: Right atrial size was severely dilated. Pericardium: There is no evidence of pericardial effusion. Mitral Valve: The mitral valve is abnormal. Severe mitral annular calcification. Moderate mitral valve regurgitation. Moderate mitral valve stenosis. MV peak gradient, 17.3 mmHg. The mean mitral valve gradient is  6.0 mmHg. Tricuspid Valve: The tricuspid valve is normal in structure. Tricuspid valve regurgitation is mild to moderate. Aortic Valve: The aortic valve is tricuspid. There is moderate calcification of the aortic valve. Aortic valve regurgitation is moderate. Aortic regurgitation PHT measures 359 msec. Mild to moderate aortic stenosis is present. Aortic valve mean gradient measures 8.0 mmHg. Aortic valve peak gradient measures 16.5 mmHg. Aortic valve area, by VTI measures 1.31 cm. Pulmonic Valve: The pulmonic valve was grossly normal. Pulmonic valve regurgitation is not visualized. Aorta: The aortic root and ascending aorta are structurally normal, with no evidence of dilitation. Venous: The inferior vena cava is dilated in size with greater than 50% respiratory variability, suggesting right atrial pressure of 8 mmHg. IAS/Shunts: Evidence of atrial level shunting detected by color flow Doppler.   LEFT VENTRICLE PLAX 2D LVIDd:         4.90 cm  Diastology LVIDs:         4.30 cm  LV e' medial:    2.68 cm/s LV PW:         1.30 cm  LV E/e' medial:  70.5 LV IVS:        1.50 cm  LV e' lateral:   4.81 cm/s LVOT diam:     2.00 cm  LV E/e' lateral: 39.3 LV SV:         51 LV SV Index:   28 LVOT Area:     3.14 cm  RIGHT VENTRICLE            IVC RV Basal diam:  3.70 cm    IVC diam: 2.50 cm RV Mid diam:    2.60 cm RV S prime:     7.83 cm/s TAPSE (M-mode): 1.3 cm LEFT ATRIUM              Index       RIGHT ATRIUM           Index LA diam:        6.20 cm  3.38 cm/m  RA Area:     30.20 cm LA Vol (A2C):   140.0 ml 76.37 ml/m RA Volume:   108.00 ml 58.92 ml/m LA Vol (A4C):   138.0 ml 75.28 ml/m LA Biplane Vol: 142.0 ml 77.47 ml/m  AORTIC VALVE AV Area (Vmax):    1.23 cm AV Area (Vmean):   1.36 cm AV Area (VTI):     1.31 cm AV Vmax:           203.00 cm/s AV Vmean:          133.000 cm/s AV VTI:            0.390 m AV Peak Grad:      16.5 mmHg AV Mean Grad:      8.0 mmHg LVOT Vmax:         79.40 cm/s LVOT Vmean:        57.400 cm/s LVOT VTI:          0.163 m LVOT/AV VTI ratio: 0.42 AI PHT:            359 msec  AORTA Ao Root diam: 3.40 cm Ao Asc diam:  3.60 cm MITRAL VALVE                 TRICUSPID VALVE MV Area (PHT): 2.78 cm      TR Peak grad:   51.6 mmHg MV Area VTI:   1.21 cm      TR Vmax:  359.00 cm/s MV Peak grad:  17.3 mmHg MV Mean grad:  6.0 mmHg      SHUNTS MV Vmax:       2.08 m/s      Systemic VTI:  0.16 m MV Vmean:      108.0 cm/s    Systemic Diam: 2.00 cm MV Decel Time: 273 msec MR Peak grad:    131.8 mmHg MR Mean grad:    79.5 mmHg MR Vmax:         574.00 cm/s MR Vmean:        416.5 cm/s MR PISA:         2.26 cm MR PISA Eff ROA: 16 mm MR PISA Radius:  0.60 cm MV E velocity: 189.00 cm/s Oswaldo Milian MD Electronically signed by Oswaldo Milian MD Signature Date/Time: 10/21/2020/4:55:55 PM    Final        Assessment / Plan:    80 y/o male with thoracic spine hemangioma on XRT, admitted  with worsening weakness, anemia, dark stools. Course complicated by MRSA bacteremia, on daptomycin, set for TEE today. Also with left hand cellulitis / abscess, and sacral decubitus ulcer.  EGD done 3/22 showing large hiatal hernia, cameron lesions, and multiple gastric ulcers. All ulcers clean based without high risk stigmata for bleeding. Biopsies negative for H pylori. He has been on IV PPI. Hgb drifted initially post procedure to 7.8 however but back up to 9s on multiple blood draws without transfusion, I think the 7.8 was likely a spurious value. BUN has gone up, unclear why, however he has not had any blood per rectum or bleeding symptoms per nursing staff. Given no high risk stigmata for bleeding and all clean based ulcers, risk for rebleeding based on endoscopic appearance should be low.   When he is cleared to eat today (TEE pending), can advance to regular diet. Continue protonix 40mg  BID PO for another 4 weeks and then once daily thereafter. He can follow up with his primary GI physician after his discharge [facility]).   Otherwise, has developed a mild ALT elevation over the past month. Can order a RUQ [place] to ensure okay, trend ALT, perform additional serologic workup if needed.   Recommend: - protonix 40mg  PO BID for 4 weeks, then once daily thereafter - advance to regular diet (after TEE) - monitor for recurrent bleeding - none per nursing staff and Hgb stable, but BUN rising, monitor renal function - recommend RUQ [place] to initially evaluate elevated ALT, consider additional serologic workup if this persists  We will sign off for now, please call with questions moving forward.  Rains Cellar, MD [facility]

## 2020-10-23 NOTE — Progress Notes (Addendum)
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 Presenting with increased weakness over the last week with anemia as well as dark stools. 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 He normally is able to use a walker to move at least 15 yards per his sister at bedside. Over the last week, he's had increasing overall weakness and lethargy; as well as an acute inability to use his walker or move on his own. She notes that his sacral wound seems worse and may have had some subjective fever. He c/o on shoulder pain, but she notes that he had been putting extra strain on that right shoulder. She believes that he may have injured it during those exercises.  Of note, patient came from [facility].  In the ED, pt was found to have a 3.5 pt drop in his Hgb and he was FOBT positive. GI was consulted. There was concern for worsening BLE weakness. Neurosurgery was consulted. L hand cellulitis was noted. Blood cx noted to grow MRSA. Patient was started on vanc. TRH was called for admission.   Assessment & Plan:   Active Problems:   GIB (gastrointestinal bleeding)   Symptomatic anemia   Heme positive stool   Multiple gastric ulcers   Hiatal hernia   Acute pain of right shoulder   Generalized weakness   Elevated ALT measurement  #1 MRSA bacteremia/Klebsiella UTI -seen by ID started on daptomycin.   Transthoracic echo -left ventricular ejection fraction 25 to 30%.  Severely decreased function.  Global hypokinesis.  Moderate asymmetric left ventricular hypertrophy of the basal septal segment.  RV systolic function is moderately reduced.  Moderately elevated  pulmonary artery systolic pressure.  Moderate AR moderate AS Cancelled TEE as family and patient wants to move towards palliative care and may be hospice  Received cefepime for uti  #2 sacral wound present on admission-care.  Patient has numerous skin lesions to buttocks, unstageable pressure injury to the left ilial tuberosity, weeping areas of partial skin loss to the bilateral lower extremities erythema and skin loss right buttock scrotum and medial thighs, trauma to the left hand and first digit. Wound care notes reviewed. Continue low air loss mattress, bilateral heel pressure redistribution heel boots for pressure related injury and prevention.  Topical care to bilateral lower extremities with Xeroform gauze ABD pad secured with Kerlix with daily changes. Blood-filled blister in the left hand and first digit painted with Betadine until it dries  Cherrelle's Butt cream to the butt lesions. Santyl to unstageable pressure injury.  #3 esophageal candidiasis-on fluconazole, will DC statin due to increased risk of rhabdomyolysis.  #4 upper GI bleed -likely secondary to [facility] lesions in the setting of Eliquis use.  Status post EGD with Lysbeth Galas lesions, esophageal plaques consistent with esophageal candidiasis.  10 nonbleeding gastric ulcers with no stigmata of bleeding  H. pylori testing was done pending.  Continue Protonix 40 mg twice a day. No NSAIDs, restart Eliquis when okay with GI. Hemoglobin 9.1   #5 spinal hemangioma-patient getting radiation as he is a very poor surgical candidate.  #6 protein calorie malnutrition continue and appreciate dietary input.ALB 1.8.  #7 hypokalemia potassium 4.2 magnesium 2.1.  3.4  repleted.   #8 right shoulder pain secondary to partial tear of the subscapularis and supraspinatus tendons.  Small right glenohumeral joint effusion and mild joint arthritis.   Estimated body mass index is 24.86 kg/m as calculated from the following:   Height as of this  encounter: 5\' 7"  (1.702 m).   Weight as of this encounter: 72 kg.  DVT prophylaxis: none GI bleed Code Status: DO NOT RESUSCITATE discussed in detail with patient's Sister Blanch Media who is the POA and she brought in his living will. family Communication:kevin nephew [phone]  disposition Plan:  Status is: Inpatient   Dispo: The patient is from: assisted living              Anticipated d/c is to: SNF              Patient currently is not medically stable to d/c.   Difficult to place patient na   Consultants: GI neurosurgery infectious disease  Procedures:   Antimicrobials   Subjective:  He appears weaker than yesterday having a lot of pain unable to move both of his lower extremities able to clear secretions  Objective: Vitals:   10/22/20 1734 10/22/20 2023 10/22/20 2200 10/22/2020 0549  BP: (!) 156/71 (!) 142/77  (!) 152/68  Pulse: 70 69  69  Resp:  18  20  Temp: 97.7 F (36.5 C) 98 F (36.7 C)  98.2 F (36.8 C)  TempSrc: Oral Oral  Oral  SpO2: 95% 94% 100% 90%  Weight:      Height:        Intake/Output Summary (Last 24 hours) at 10/29/2020 1213 Last data filed at 10/20/2020 0501 Gross per 24 hour  Intake 240 ml  Output 1350 ml  Net -1110 ml   Filed Weights   10/16/2020 2100  Weight: 72 kg    Examination:  General exam: frail elderly male in distress due to pain and discomfort  Respiratory system: Coarse breath sounds and rhonchi  to auscultation. Respiratory effort normal. Cardiovascular system: S1 & S2 heard, RRR. No JVD, murmurs, rubs, gallops or clicks. No pedal edema. Gastrointestinal system: Abdomen is nondistended, soft and nontender. No organomegaly or masses felt. Normal bowel sounds heard. Central nervous system: 3 x 5 in both lower extremities extremities: 1+ edema Skin: Multiple excoriated lesions in the lower extremities and multiple decubitus lesions in the sacrum and scrotum and medial thighs.   Index finger with a small blood-filled blister from  from injury during nail cutting. Psychiatry: Judgement and insight appear normal. Mood & affect appropriate.     Data Reviewed: I have personally reviewed following labs and imaging studies  CBC: Recent Labs  Lab 10/08/2020 1013 10/08/2020 1537 10/06/2020 1420 10/10/2020 1921 10/21/20 0238 10/21/20 1636 10/22/20 0056 10/28/2020 0533  WBC 5.8   < > 7.1 7.0 8.4  --  7.1 9.0  NEUTROABS 5.3  --   --   --  7.8*  --   --   --   HGB 8.2*   < > 8.8* 9.4* 7.8* 9.8* 9.1* 9.2*  HCT 24.8*   < > 27.2* 29.0* 24.0* 30.2* 28.3* 28.5*  MCV 88.3   < > 89.8 90.9 90.6  --  91.6 89.6  PLT 106*   < > 125* 124* 145*  --  149* 162   < > = values in this interval not displayed.   Basic Metabolic Panel: Recent Labs  Lab 10/25/2020 1013 10/25/2020 2352 10/21/20 0238 10/22/20 0056 10/23/20 0533  NA 134* 132* 135 131* 131*  K 3.6 3.5 3.4* 4.2 4.4  CL 100 99 105 104 104  CO2 27 23 20* 18* 19*  GLUCOSE 121* 104* 234* 209* 260*  BUN 40* 39* 46* 49* 60*  CREATININE 1.38* 1.29* 1.37* 1.28* 1.26*  CALCIUM 9.4 9.2 9.4 9.4 9.4  MG  --   --  2.1  --   --    GFR: Estimated Creatinine Clearance: 45.2 mL/min (A) (by C-G formula based on SCr of 1.26 mg/dL (H)). Liver Function Tests: Recent Labs  Lab 10/25/2020 1013 10/28/2020 2352 10/22/20 0056 10/29/2020 0533  AST 32 32 29 25  ALT 69* 69* 94* 85*  ALKPHOS 43 44 54 53  BILITOT 0.9 1.0 1.0 1.0  PROT 4.9* 4.9* 5.2* 5.2*  ALBUMIN 1.7* 1.7* 1.8* 1.8*   No results for input(s): LIPASE, AMYLASE in the last 168 hours. No results for input(s): AMMONIA in the last 168 hours. Coagulation Profile: Recent Labs  Lab 10/21/2020 1013  INR 2.2*   Cardiac Enzymes: Recent Labs  Lab 10/21/20 0238  CKTOTAL 16*   BNP (last 3 results) No results for input(s): PROBNP in the last 8760 hours. HbA1C: No results for input(s): HGBA1C in the last 72 hours. CBG: Recent Labs  Lab 10/22/20 1217 10/22/20 1730 10/22/20 2211 09/29/2020 0802 10/24/2020 1143  GLUCAP 166* 291* 330*  220* 195*   Lipid Profile: No results for input(s): CHOL, HDL, LDLCALC, TRIG, CHOLHDL, LDLDIRECT in the last 72 hours. Thyroid Function Tests: No results for input(s): TSH, T4TOTAL, FREET4, T3FREE, THYROIDAB in the last 72 hours. Anemia Panel: No results for input(s): VITAMINB12, FOLATE, FERRITIN, TIBC, IRON, RETICCTPCT in the last 72 hours. Sepsis Labs: Recent Labs  Lab 10/01/2020 1013 10/11/2020 1213  LATICACIDVEN 1.0 1.1    Recent Results (from the past 240 hour(s))  Blood Culture (routine x 2)     Status: Abnormal   Collection Time: 10/01/2020 10:00 AM   Specimen: BLOOD LEFT HAND  Result Value Ref Range Status   Specimen Description   Final    BLOOD LEFT HAND Performed at [facility], [place] [address], [place], [place] [address]    Special Requests   Final    BOTTLES DRAWN AEROBIC AND ANAEROBIC Blood Culture results may not be optimal due to an inadequate volume of blood received in culture bottles Performed at [place] [address], [place], [place] [address]    Culture  Setup Time   Final    IN BOTH AEROBIC AND ANAEROBIC BOTTLES GRAM POSITIVE COCCI CRITICAL VALUE NOTED.  VALUE IS CONSISTENT WITH PREVIOUSLY REPORTED AND CALLED VALUE.    Culture (A)  Final    STAPHYLOCOCCUS AUREUS SUSCEPTIBILITIES PERFORMED ON PREVIOUS CULTURE WITHIN THE LAST 5 DAYS. Performed at [facility], [place] [address] [place], [place] [address]    Report Status 10/22/2020 FINAL  Final  Resp Panel by RT-PCR (Flu A&B, Covid) Nasopharyngeal Swab     Status: None   Collection Time: 10/01/2020 10:13 AM   Specimen: Nasopharyngeal Swab; Nasopharyngeal(NP) swabs in vial transport medium  Result Value Ref Range Status   SARS Coronavirus 2 by RT PCR NEGATIVE NEGATIVE Final    Comment: (NOTE) SARS-CoV-2 target nucleic acids are NOT DETECTED.  The SARS-CoV-2 RNA is generally detectable in upper respiratory specimens during the acute phase of infection. The  lowest concentration of SARS-CoV-2 viral copies this assay can detect is 138 copies/mL. A negative result does not preclude SARS-Cov-2 infection  and should not be used as the sole basis for treatment or other patient management decisions. A negative result may occur with  improper specimen collection/handling, submission of specimen other than nasopharyngeal swab, presence of viral mutation(s) within the areas targeted by this assay, and inadequate number of viral copies(<138 copies/mL). A negative result must be combined with clinical observations, patient history, and epidemiological information. The expected result is Negative.  Fact Sheet for Patients:  EntrepreneurPulse.com.au  Fact Sheet for Healthcare Providers:  IncredibleEmployment.be  This test is no t yet approved or cleared by the [place] FDA and  has been authorized for detection and/or diagnosis of SARS-CoV-2 by FDA under an Emergency Use Authorization (EUA). This EUA will remain  in effect (meaning this test can be used) for the duration of the COVID-19 declaration under Section 564(b)(1) of the Act, 21 U.S.C.section 360bbb-3(b)(1), unless the authorization is terminated  or revoked sooner.       Influenza A by PCR NEGATIVE NEGATIVE Final   Influenza B by PCR NEGATIVE NEGATIVE Final    Comment: (NOTE) The Xpert Xpress SARS-CoV-2/FLU/RSV plus assay is intended as an aid in the diagnosis of influenza from Nasopharyngeal swab specimens and should not be used as a sole basis for treatment. Nasal washings and aspirates are unacceptable for Xpert Xpress SARS-CoV-2/FLU/RSV testing.  Fact Sheet for Patients: EntrepreneurPulse.com.au  Fact Sheet for Healthcare Providers: IncredibleEmployment.be  This test is not yet approved or cleared by the [place] FDA and has been authorized for detection and/or diagnosis of SARS-CoV-2 by FDA under  an Emergency Use Authorization (EUA). This EUA will remain in effect (meaning this test can be used) for the duration of the COVID-19 declaration under Section 564(b)(1) of the Act, 21 U.S.C. section 360bbb-3(b)(1), unless the authorization is terminated or revoked.  Performed at [facility], [place] [address], [place], [place] [address]   Blood Culture (routine x 2)     Status: Abnormal   Collection Time: 10/05/2020 10:15 AM   Specimen: BLOOD  Result Value Ref Range Status   Specimen Description   Final    BLOOD LEFT ANTECUBITAL Performed at [facility] [address] [place], [place] [address]    Special Requests   Final    BOTTLES DRAWN AEROBIC AND ANAEROBIC Blood Culture results may not be optimal due to an inadequate volume of blood received in culture bottles Performed at [place] [address] [place], [place] [address]    Culture  Setup Time   Final    IN BOTH AEROBIC AND ANAEROBIC BOTTLES GRAM POSITIVE COCCI CRITICAL RESULT CALLED TO, READ BACK BY AND VERIFIED WITHSeleta Rhymes [facility] 10/06/2020 0245 JDW Performed at [facility], [place] [address] [place], [place] [address]    Culture METHICILLIN RESISTANT STAPHYLOCOCCUS AUREUS (A)  Final   Report Status 10/22/2020 FINAL  Final   Organism ID, Bacteria METHICILLIN RESISTANT STAPHYLOCOCCUS AUREUS  Final      Susceptibility   Methicillin resistant staphylococcus aureus - MIC*    CIPROFLOXACIN >=8 RESISTANT Resistant     ERYTHROMYCIN >=8 RESISTANT Resistant     GENTAMICIN <=0.5 SENSITIVE Sensitive     OXACILLIN >=4 RESISTANT Resistant     TETRACYCLINE <=1 SENSITIVE Sensitive     VANCOMYCIN <=0.5 SENSITIVE Sensitive     TRIMETH/SULFA <=10 SENSITIVE Sensitive     CLINDAMYCIN RESISTANT Resistant     RIFAMPIN <=0.5 SENSITIVE Sensitive     Inducible Clindamycin POSITIVE Resistant     *  METHICILLIN RESISTANT STAPHYLOCOCCUS AUREUS  Blood Culture ID Panel (Reflexed)      Status: Abnormal   Collection Time: 10/17/2020 10:15 AM  Result Value Ref Range Status   Enterococcus faecalis NOT DETECTED NOT DETECTED Final   Enterococcus Faecium NOT DETECTED NOT DETECTED Final   Listeria monocytogenes NOT DETECTED NOT DETECTED Final   Staphylococcus species DETECTED (A) NOT DETECTED Final    Comment: CRITICAL RESULT CALLED TO, READ BACK BY AND VERIFIED WITH: E JACKSON PHARMD 10/14/2020 0245 JDW    Staphylococcus aureus (BCID) DETECTED (A) NOT DETECTED Final    Comment: Methicillin (oxacillin)-resistant Staphylococcus aureus (MRSA). MRSA is predictably resistant to beta-lactam antibiotics (except ceftaroline). Preferred therapy is vancomycin unless clinically contraindicated. Patient requires contact precautions if  hospitalized. CRITICAL RESULT CALLED TO, READ BACK BY AND VERIFIED WITH: E JACKSON PHARMD 09/29/2020 0245 JDW    Staphylococcus epidermidis NOT DETECTED NOT DETECTED Final   Staphylococcus lugdunensis NOT DETECTED NOT DETECTED Final   Streptococcus species NOT DETECTED NOT DETECTED Final   Streptococcus agalactiae NOT DETECTED NOT DETECTED Final   Streptococcus pneumoniae NOT DETECTED NOT DETECTED Final   Streptococcus pyogenes NOT DETECTED NOT DETECTED Final   A.calcoaceticus-baumannii NOT DETECTED NOT DETECTED Final   Bacteroides fragilis NOT DETECTED NOT DETECTED Final   Enterobacterales NOT DETECTED NOT DETECTED Final   Enterobacter cloacae complex NOT DETECTED NOT DETECTED Final   Escherichia coli NOT DETECTED NOT DETECTED Final   Klebsiella aerogenes NOT DETECTED NOT DETECTED Final   Klebsiella oxytoca NOT DETECTED NOT DETECTED Final   Klebsiella pneumoniae NOT DETECTED NOT DETECTED Final   Proteus species NOT DETECTED NOT DETECTED Final   Salmonella species NOT DETECTED NOT DETECTED Final   Serratia marcescens NOT DETECTED NOT DETECTED Final   Haemophilus influenzae NOT DETECTED NOT DETECTED Final   Neisseria meningitidis NOT DETECTED NOT DETECTED  Final   Pseudomonas aeruginosa NOT DETECTED NOT DETECTED Final   Stenotrophomonas maltophilia NOT DETECTED NOT DETECTED Final   Candida albicans NOT DETECTED NOT DETECTED Final   Candida auris NOT DETECTED NOT DETECTED Final   Candida glabrata NOT DETECTED NOT DETECTED Final   Candida krusei NOT DETECTED NOT DETECTED Final   Candida parapsilosis NOT DETECTED NOT DETECTED Final   Candida tropicalis NOT DETECTED NOT DETECTED Final   Cryptococcus neoformans/gattii NOT DETECTED NOT DETECTED Final   Meth resistant mecA/C and MREJ DETECTED (A) NOT DETECTED Final    Comment: CRITICAL RESULT CALLED TO, READ BACK BY AND VERIFIED WITHSeleta Rhymes [facility] 10/03/2020 0245 JDW Performed at [facility], [address], [place], [place] [address]   Urine culture     Status: Abnormal   Collection Time: 10/05/2020 11:02 AM   Specimen: In/Out Cath Urine  Result Value Ref Range Status   Specimen Description   Final    IN/OUT CATH URINE Performed at [place] [facility] [address] [place], [place] [address]    Special Requests   Final    NONE Performed at [facility], [place] [address], [place], [place] [address]    Culture >=100,000 COLONIES/mL KLEBSIELLA PNEUMONIAE (A)  Final   Report Status 10/21/2020 FINAL  Final   Organism ID, Bacteria KLEBSIELLA PNEUMONIAE (A)  Final      Susceptibility   Klebsiella pneumoniae - MIC*    AMPICILLIN >=32 RESISTANT Resistant     CEFAZOLIN <=4 SENSITIVE Sensitive     CEFEPIME <=0.12 SENSITIVE Sensitive     CEFTRIAXONE <=0.25 SENSITIVE Sensitive     CIPROFLOXACIN <=  0.25 SENSITIVE Sensitive     GENTAMICIN <=1 SENSITIVE Sensitive     IMIPENEM <=0.25 SENSITIVE Sensitive     NITROFURANTOIN 128 RESISTANT Resistant     TRIMETH/SULFA >=320 RESISTANT Resistant     AMPICILLIN/SULBACTAM >=32 RESISTANT Resistant     PIP/TAZO 32 INTERMEDIATE Intermediate     * >=100,000 COLONIES/mL KLEBSIELLA PNEUMONIAE  Culture, blood (routine x 2)      Status: None (Preliminary result)   Collection Time: 10/21/20  2:38 AM   Specimen: BLOOD  Result Value Ref Range Status   Specimen Description   Final    BLOOD LEFT HAND Performed at [facility] [address] [place], [place] [address]    Special Requests   Final    BOTTLES DRAWN AEROBIC ONLY Blood Culture adequate volume Performed at [facility] [address] [place], [place] [address]    Culture   Final    NO GROWTH 2 DAYS Performed at [facility] [address] [place], [place] [address]    Report Status PENDING  Incomplete  Culture, blood (routine x 2)     Status: None (Preliminary result)   Collection Time: 10/21/20  2:39 AM   Specimen: BLOOD  Result Value Ref Range Status   Specimen Description   Final    BLOOD BLOOD RIGHT FOREARM Performed at [facility] [address] [place], [place] [address]    Special Requests   Final    BOTTLES DRAWN AEROBIC ONLY Blood Culture results may not be optimal due to an inadequate volume of blood received in culture bottles Performed at [facility] [address] [place], [place] [address]    Culture   Final    NO GROWTH 2 DAYS Performed at [facility] [address], [place], [place] [address]    Report Status PENDING  Incomplete         Radiology Studies: DG Chest 1 View  Result Date: 10/22/2020 CLINICAL DATA:  Cough. EXAM: CHEST  1 VIEW COMPARISON:  October 19, 2020. FINDINGS: Stable cardiomegaly. Status post coronary bypass graft. Left-sided pacemaker is unchanged in position. No pneumothorax or pleural effusion is noted. Lungs are clear. Bony thorax is unremarkable. IMPRESSION: No active disease. Electronically Signed   By: Marijo Conception M.D.   On: 10/22/2020 13:35   CT SHOULDER RIGHT WO CONTRAST  Result Date: 10/21/2020 CLINICAL DATA:  Shoulder trauma EXAM: CT OF THE UPPER RIGHT EXTREMITY WITHOUT CONTRAST TECHNIQUE:  Multidetector CT imaging of the upper right extremity was performed according to the standard protocol. COMPARISON:  None. FINDINGS: Bones/Joint/Cartilage No fracture or dislocation. There is mild glenohumeral joint osteoarthritis with joint space loss. Mild AC joint arthrosis is noted. A small joint effusion is present. Ligaments Suboptimally assessed by CT. Muscles and Tendons Probable partial tear of the superior subscapularis and anterior supraspinatus is noted. There is mild overlying soft tissue swelling. The infraspinatus and teres minor tendons do appear to be grossly intact. No muscular atrophy is seen. Soft tissues Mild soft tissue edema seen along the lateral and posterior aspect of the shoulder. Scattered vascular calcifications are noted. A small right pleural effusion with patchy airspace opacity at the right lung base is noted IMPRESSION: No acute osseous abnormality Probable partial tear of the superior subscapularis and anterior supraspinatus tendons, however somewhat limited due to technique Small glenohumeral joint effusion Mild glenohumeral joint osteoarthritis Small right pleural effusion and adjacent patchy airspace opacity at the  right lung base Electronically Signed   By: Prudencio Pair M.D.   On: 10/21/2020 17:55   ECHOCARDIOGRAM COMPLETE  Result Date: 10/21/2020    ECHOCARDIOGRAM REPORT   Patient Name:   FARIS COOLMAN Date of Exam: 10/21/2020 Medical Rec #:  [id number]    Height:       67.0 in Accession #:    [id number]   Weight:       158.7 lb Date of Birth:  18-Dec-1940    BSA:          1.833 m Patient Age:    47 years     BP:           130/67 mmHg Patient Gender: M            HR:           70 bpm. Exam Location:  Inpatient Procedure: 2D Echo, Cardiac Doppler and Color Doppler Indications:    Bacteremia R78.81  History:        Patient has no prior history of Echocardiogram examinations.                 Cardiomyopathy and CHF, CAD, Defibrillator and Prior CABG,                  Arrythmias:Atrial Fibrillation; Risk Factors:Dyslipidemia,                 Diabetes and Sleep Apnea. CKD.  Sonographer:    Tiffany Dance Referring Phys: [id number] [place]  1. Left ventricular ejection fraction, by estimation, is 25 to 30%. The left ventricle has severely decreased function. The left ventricle demonstrates global hypokinesis. There is moderate asymmetric left ventricular hypertrophy of the basal-septal segment. Left ventricular diastolic parameters are indeterminate.  2. Right ventricular systolic function is moderately reduced. The right ventricular size is mildly enlarged. There is moderately elevated pulmonary artery systolic pressure. The estimated right ventricular systolic pressure is 70.9 mmHg.  3. Left atrial size was severely dilated.  4. Right atrial size was severely dilated.  5. The mitral valve is abnormal. Moderate mitral valve regurgitation. Moderate mitral stenosis. MG 6 mmHg at HR 70 bpm, MVA 1.2 cm^2 by continuity equation. Severe mitral annular calcification.  6. Tricuspid valve regurgitation is mild to moderate.  7. The aortic valve is tricuspid. There is moderate calcification of the aortic valve. Aortic valve regurgitation is moderate. Mild to moderate aortic valve stenosis. AVA (1.3 cm^2) and DI (0.4) suggest moderate AS, but MG only 90mmHg likely due to systolic dysfunction  8. The inferior vena cava is dilated in size with >50% respiratory variability, suggesting right atrial pressure of 8 mmHg.  9. Evidence of atrial level shunting detected by color flow Doppler, suggests PFO. Conclusion(s)/Recommendation(s): No vegetation seen. If clinical suspicion for endocarditis, recommend TEE. FINDINGS  Left Ventricle: Left ventricular ejection fraction, by estimation, is 25 to 30%. The left ventricle has severely decreased function. The left ventricle demonstrates global hypokinesis. The left ventricular internal cavity size was normal in size. There is moderate  asymmetric left ventricular hypertrophy of the basal-septal segment. Left ventricular diastolic parameters are indeterminate. Right Ventricle: The right ventricular size is mildly enlarged. Right vetricular wall thickness was not well visualized. Right ventricular systolic function is moderately reduced. There is moderately elevated pulmonary artery systolic pressure. The tricuspid regurgitant velocity is 3.59 m/s, and with an assumed right atrial pressure of 8 mmHg, the estimated right ventricular systolic pressure is 62.8 mmHg. Left Atrium: Left atrial  size was severely dilated. Right Atrium: Right atrial size was severely dilated. Pericardium: There is no evidence of pericardial effusion. Mitral Valve: The mitral valve is abnormal. Severe mitral annular calcification. Moderate mitral valve regurgitation. Moderate mitral valve stenosis. MV peak gradient, 17.3 mmHg. The mean mitral valve gradient is 6.0 mmHg. Tricuspid Valve: The tricuspid valve is normal in structure. Tricuspid valve regurgitation is mild to moderate. Aortic Valve: The aortic valve is tricuspid. There is moderate calcification of the aortic valve. Aortic valve regurgitation is moderate. Aortic regurgitation PHT measures 359 msec. Mild to moderate aortic stenosis is present. Aortic valve mean gradient measures 8.0 mmHg. Aortic valve peak gradient measures 16.5 mmHg. Aortic valve area, by VTI measures 1.31 cm. Pulmonic Valve: The pulmonic valve was grossly normal. Pulmonic valve regurgitation is not visualized. Aorta: The aortic root and ascending aorta are structurally normal, with no evidence of dilitation. Venous: The inferior vena cava is dilated in size with greater than 50% respiratory variability, suggesting right atrial pressure of 8 mmHg. IAS/Shunts: Evidence of atrial level shunting detected by color flow Doppler.  LEFT VENTRICLE PLAX 2D LVIDd:         4.90 cm  Diastology LVIDs:         4.30 cm  LV e' medial:    2.68 cm/s LV PW:          1.30 cm  LV E/e' medial:  70.5 LV IVS:        1.50 cm  LV e' lateral:   4.81 cm/s LVOT diam:     2.00 cm  LV E/e' lateral: 39.3 LV SV:         51 LV SV Index:   28 LVOT Area:     3.14 cm  RIGHT VENTRICLE            IVC RV Basal diam:  3.70 cm    IVC diam: 2.50 cm RV Mid diam:    2.60 cm RV S prime:     7.83 cm/s TAPSE (M-mode): 1.3 cm LEFT ATRIUM              Index       RIGHT ATRIUM           Index LA diam:        6.20 cm  3.38 cm/m  RA Area:     30.20 cm LA Vol (A2C):   140.0 ml 76.37 ml/m RA Volume:   108.00 ml 58.92 ml/m LA Vol (A4C):   138.0 ml 75.28 ml/m LA Biplane Vol: 142.0 ml 77.47 ml/m  AORTIC VALVE AV Area (Vmax):    1.23 cm AV Area (Vmean):   1.36 cm AV Area (VTI):     1.31 cm AV Vmax:           203.00 cm/s AV Vmean:          133.000 cm/s AV VTI:            0.390 m AV Peak Grad:      16.5 mmHg AV Mean Grad:      8.0 mmHg LVOT Vmax:         79.40 cm/s LVOT Vmean:        57.400 cm/s LVOT VTI:          0.163 m LVOT/AV VTI ratio: 0.42 AI PHT:            359 msec  AORTA Ao Root diam: 3.40 cm Ao Asc diam:  3.60 cm MITRAL VALVE  TRICUSPID VALVE MV Area (PHT): 2.78 cm      TR Peak grad:   51.6 mmHg MV Area VTI:   1.21 cm      TR Vmax:        359.00 cm/s MV Peak grad:  17.3 mmHg MV Mean grad:  6.0 mmHg      SHUNTS MV Vmax:       2.08 m/s      Systemic VTI:  0.16 m MV Vmean:      108.0 cm/s    Systemic Diam: 2.00 cm MV Decel Time: 273 msec MR Peak grad:    131.8 mmHg MR Mean grad:    79.5 mmHg MR Vmax:         574.00 cm/s MR Vmean:        416.5 cm/s MR PISA:         2.26 cm MR PISA Eff ROA: 16 mm MR PISA Radius:  0.60 cm MV E velocity: 189.00 cm/s Oswaldo Milian MD Electronically signed by Oswaldo Milian MD Signature Date/Time: 10/21/2020/4:55:55 PM    Final         Scheduled Meds: . carvedilol  12.5 mg Oral BID  . Chlorhexidine Gluconate Cloth  6 each Topical Daily  . collagenase   Topical Daily  . dexamethasone  12 mg Oral Q8H  . digoxin  0.125 mg Oral Daily   . feeding supplement  237 mL Oral BID BM  . ferrous sulfate  325 mg Oral Q breakfast  . finasteride  5 mg Oral QHS  . fluconazole  100 mg Oral Daily  . Gerhardt's butt cream   Topical TID  . insulin aspart  0-15 Units Subcutaneous TID WC  . insulin aspart  0-5 Units Subcutaneous QHS  . lidocaine  1 patch Transdermal Q24H  . multivitamin with minerals  1 tablet Oral Daily  . nutrition supplement (JUVEN)  1 packet Oral BID BM  . pantoprazole  40 mg Oral BID  . sotalol  80 mg Oral Daily   And  . sotalol  120 mg Oral QHS  . tamsulosin  0.4 mg Oral Daily   Continuous Infusions: . DAPTOmycin (CUBICIN)  IV 650 mg (10/22/20 2029)     LOS: 2 days   Georgette Shell, MD 10/05/2020, 12:13 PM

## 2020-10-23 NOTE — Progress Notes (Signed)
Pharmacy Antibiotic Note  Timothy Neal is a 80 y.o. male currently on fluconazole for esophageal candidiasis.  Today, 10/01/2020: - day #4 fluconazole - afeb, wbc wnl - scr trending down (1.26, crcl~ 45) - plan for TEE today   Plan: - continue fluconazole 100 mg daily for crcl<50 - daptomycin 650mg  daily for MRSA bacteremia - monitor renal function closely _____________________________________  Height: 5\' 7"  (170.2 cm) Weight: 72 kg (158 lb 11.7 oz) IBW/kg (Calculated) : 66.1  Temp (24hrs), Avg:98 F (36.7 C), Min:97.7 F (36.5 C), Max:98.2 F (36.8 C)  Recent Labs  Lab 10/16/2020 1013 10/07/2020 1213 10/18/2020 2352 10/24/2020 1420 09/29/2020 1921 10/21/20 0238 10/22/20 0056 10/29/2020 0533  WBC 5.8  --  5.7 7.1 7.0 8.4 7.1 9.0  CREATININE 1.38*  --  1.29*  --   --  1.37* 1.28* 1.26*  LATICACIDVEN 1.0 1.1  --   --   --   --   --   --     Estimated Creatinine Clearance: 45.2 mL/min (A) (by C-G formula based on SCr of 1.26 mg/dL (H)).    No Known Allergies  Antimicrobials this admission: 3/21 vanc >> 3/22 3/21 cefepime >> 3/23 3/22 fluconazole (esophageal candidiasis)>>  3/22 dapto>>  Microbiology results: 3/21 BCx: 4/4 staph aureus, Mec A,C+ 3/21 UCx:  >100K klebsiella (R=amp, unasyn, nitro, bactrim; I= zosyn) FINAL  (treated for 3 days with cefepime per ID) 3/27 Resp PCR: covid neg, flu neg 3/23 bcx x2:   Thank you for allowing pharmacy to be a part of this patient's care.  Lynelle Doctor 10/02/2020 9:23 AM

## 2020-10-23 NOTE — Consult Note (Signed)
Consultation Note Date: 10/15/2020   Patient Name: Timothy Neal  DOB: May 01, 1941  MRN: [id number]  Age / Sex: 80 y.o., male   PCP: [name], MD Referring Physician: Georgette Shell, MD   REASON FOR CONSULTATION:Establishing goals of care  Palliative Care consult requested for goals of care discussion in this 80 y.o. 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  Patient presented to the ED via EMS from [facility] facility with concerns of increased weakness.  Patient been followed followed by GI, infectious disease, and radiation oncology.  Clinical Assessment and Goals of Care: I have reviewed medical records including lab results, imaging, Epic notes, and MAR, received report from the bedside RN, and assessed the patient.   I met at the bedside with Mr. Lizak and his sister (HCPOA) Aboubacar Matsuo to discuss diagnosis prognosis, Leslie, EOL wishes, disposition and options.  Patient denies pain or shortness of breath.  Complains of generalized weakness.  He is awake alert and oriented x3.  I introduced Palliative Medicine as specialized medical care for people living with serious illness. It focuses on providing relief from the symptoms and stress of a serious illness. The goal is to improve quality of life for both the patient and the family.   We discussed a brief life review of the patient, along with his functional and nutritional status.  Patient is divorced.  He has 3 sons (1 of whom is estranged).  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  Patient has [facility] a resident of [name] since January 2022.  Prior to placement there he resided in the home with his sister for  approximately 3-4 months.  Sister reports patient continues to have further health decline including recurrent falls and concerns for his safety which warranted a more facility focused need.  Patient lived in a home alone approximately 9 months ago when he initially noticed an increase in generalized weakness, falls, and changes in his health.  Over the past several weeks patient still has rapidly declined.  Decreased appetite and significant weakness and inability to ambulate.  We discussed His current illness and what it means in the larger context of His on-going co-morbidities. Natural disease trajectory and expectations at EOL were discussed.  Mr. Dupree is tearful expressing his quality of life is little to none.  He does not wish to continue life in his current condition and knows that his health would not get any better despite medical interventions.  Emotional support provided.  Patient states "I am tired and I just want to stop doing everything and allow God to call me home!"  I created an opportunity for patient to further elaborate on his statements of being tired and stopping everything.  He states he does not wish to continue to receive medications, radiation, or anything that is going to prolong his dying process.  Sister and patient both emotional.  Support provided.  Blanch Media verbalizes her love for patient acknowledging his request and her agreement to support him.  She shares her struggles and watching him decline over the past month and does not wish for him to continue to suffer.  A detailed discussion was had today regarding advanced directives.  Concepts specific to code status, artifical feeding and hydration, continued  IV antibiotics and rehospitalization. The difference between a aggressive medical intervention and a palliative comfort care path were discussed at length. Values and goals of care important to patient and family were attempted to be elicited.   Patient has completed  advanced directives which were reviewed at the bedside.  Phuong Moffatt (sister) is patient's designated Education officer, community.  They confirm wishes for DNR/DNI, no artificial feedings, no IVs, and no antibiotics.   Laithan and his sisters Blanch Media are clear and expressed goals to discontinue all medications and medical interventions not focused on his comfort.  They wish to transition his care to focus on end-of-life allowing him to be at peace during what time he has left.  Detailed education provided on comfort care while hospitalized.  Patient and family is aware medications will be minimized in all medical interventions including radiation therapy will be discontinued.  Patient and sister verbalized understanding and agreement again confirming wishes for care to focus on his comfort.  Education provided on symptom management and medications.  Hospice services outpatient were explained and offered. Patient and family verbalized their understanding and awareness of hospice's goals and philosophy of care.  Herbie Baltimore and Blanch Media are requesting hospice home referral with a preference of [facility] in [place] to be closer to family.  Education provided on the referral process including bed availability and transfer.  Questions and concerns were addressed. The family was encouraged to call with questions or concerns.  PMT will continue to support holistically as needed.   CODE STATUS: DNR  ADVANCE DIRECTIVES: Primary Decision Maker:  Michelene Heady    SYMPTOM MANAGEMENT: see below  Palliative Prophylaxis:   Aspiration, Bowel Regimen, Delirium Protocol, Eye Care, Frequent Pain Assessment, Oral Care, Palliative Wound Care and Turn Reposition  PSYCHO-SOCIAL/SPIRITUAL:  Support System: Family  Desire for further Chaplaincy support:Yes   Additional Recommendations (Limitations, Scope, Preferences):  Full Comfort Care, No Artificial Feeding, No Hemodialysis, No IV Antibiotics, No IV Fluids, No Lab Draws,  No Radiation and No Surgical Procedures  Education on hospice   PAST MEDICAL HISTORY: Past Medical History:  Diagnosis Date  . Cardiomyopathy ([place]) 08/26/2014  . Chronic kidney disease, stage 3 unspecified ([place]) 04/20/2016  . Chronic systolic heart failure ([place]) 09/23/2014  . Coronary artery disease 04/20/2016  . Diabetes mellitus, type II ([place]) 08/26/2014   Last Assessment & Plan:  Formatting of this note might be different from the original. Home regimen.  . Dyslipidemia 04/20/2016  . History of gout 04/20/2016  . Obstructive sleep apnea (adult) (pediatric) 04/20/2016   Formatting of this note might be different from the original. does not use CPAP  . Paroxysmal atrial fibrillation ([place]) 04/20/2016   Last Assessment & Plan:  Formatting of this note might be different from the original. Continue sotalol, carvedilol. Hold Apixaban until PCP follow up, if h/H remains stable apixaban to be resumed.  . 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 statin, beta-blocker, ACE inhibitor  . 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 MDT BiV ICD implanted 09/23/14  . Spinal cord compression ([place]) 09/19/2020    ALLERGIES:  has No Known Allergies.   MEDICATIONS:  Current Facility-Administered Medications  Medication Dose Route Frequency Provider Last Rate Last Admin  . acetaminophen (TYLENOL) tablet 650 mg  650 mg Oral Q6H PRN Marylyn Ishihara, Tyrone A, DO  Or  . acetaminophen (TYLENOL) suppository 650 mg  650 mg Rectal Q6H PRN Marylyn Ishihara, Tyrone A, DO      . albuterol (PROVENTIL) (2.5 MG/3ML) 0.083% nebulizer solution 2.5 mg  2.5 mg Nebulization Q4H PRN Georgette Shell, MD      . carvedilol (COREG) tablet 12.5 mg  12.5 mg Oral BID Marylyn Ishihara, Tyrone A, DO   12.5 mg at 10/22/20 2117  . Chlorhexidine Gluconate Cloth 2 % PADS 6 each  6 each  Topical Daily Georgette Shell, MD   6 each at 10/02/2020 1141  . collagenase (SANTYL) ointment   Topical Daily Alma Friendly, MD   Given at 09/30/2020 [phone]  . DAPTOmycin (CUBICIN) 650 mg in sodium chloride 0.9 % IVPB  650 mg Intravenous Q2000 Carlyle Basques, MD 226 mL/hr at 10/22/20 2029 650 mg at 10/22/20 2029  . dexamethasone (DECADRON) tablet 12 mg  12 mg Oral Q8H Kyle, Tyrone A, DO   12 mg at 10/11/2020 0608  . digoxin (LANOXIN) tablet 0.125 mg  0.125 mg Oral Daily Kyle, Tyrone A, DO   0.125 mg at 10/22/20 1107  . feeding supplement (ENSURE ENLIVE / ENSURE PLUS) liquid 237 mL  237 mL Oral BID BM Georgette Shell, MD   237 mL at 10/22/20 1423  . ferrous sulfate tablet 325 mg  325 mg Oral Q breakfast Alma Friendly, MD   325 mg at 10/22/20 0809  . finasteride (PROSCAR) tablet 5 mg  5 mg Oral QHS Kyle, Tyrone A, DO   5 mg at 10/22/20 2119  . fluconazole (DIFLUCAN) tablet 100 mg  100 mg Oral Daily Alma Friendly, MD   100 mg at 10/25/2020 1255  . Gerhardt's butt cream   Topical TID Alma Friendly, MD   Given at 10/09/2020 [phone]  . insulin aspart (novoLOG) injection 0-15 Units  0-15 Units Subcutaneous TID WC Kyle, Tyrone A, DO   3 Units at 10/13/2020 1159  . insulin aspart (novoLOG) injection 0-5 Units  0-5 Units Subcutaneous QHS Kyle, Tyrone A, DO   4 Units at 10/22/20 2227  . lidocaine (LIDODERM) 5 % 1 patch  1 patch Transdermal Q24H Georgette Shell, MD   1 patch at 10/22/20 1434  . multivitamin with minerals tablet 1 tablet  1 tablet Oral Daily Georgette Shell, MD   1 tablet at 10/22/20 1255  . nutrition supplement (JUVEN) (JUVEN) powder packet 1 packet  1 packet Oral BID BM Georgette Shell, MD   1 packet at 10/22/20 1254  . ondansetron (ZOFRAN) tablet 4 mg  4 mg Oral Q6H PRN Marylyn Ishihara, Tyrone A, DO       Or  . ondansetron (ZOFRAN) injection 4 mg  4 mg Intravenous Q6H PRN Marylyn Ishihara, Tyrone A, DO      . pantoprazole (PROTONIX) EC tablet 40 mg  40 mg Oral BID Yetta Flock, MD   40 mg at 10/05/2020 1255  . sotalol (BETAPACE) tablet 80 mg  80 mg Oral Daily Kyle, Tyrone A, DO   80 mg at 10/22/20 1108   And  . sotalol (BETAPACE) tablet 120 mg  120 mg Oral QHS Kyle, Tyrone A, DO   120 mg at 10/22/20 2118  . tamsulosin (FLOMAX) capsule 0.4 mg  0.4 mg Oral Daily Kyle, Tyrone A, DO   0.4 mg at 10/24/2020 1255    VITAL SIGNS: BP (!) 143/70 (BP Location: Right Arm)   Pulse 70   Temp 98.2 F (36.8 C) (Oral)  Resp (!) 26   Ht 5' 7" (1.702 m)   Wt 72 kg   SpO2 93%   BMI 24.86 kg/m  Filed Weights   10/10/2020 2100  Weight: 72 kg    Estimated body mass index is 24.86 kg/m as calculated from the following:   Height as of this encounter: 5' 7" (1.702 m).   Weight as of this encounter: 72 kg.  LABS: CBC:    Component Value Date/Time   WBC 9.0 10/18/2020 0533   HGB 9.2 (L) 10/16/2020 0533   HCT 28.5 (L) 10/22/2020 0533   PLT 162 10/10/2020 0533   Comprehensive Metabolic Panel:    Component Value Date/Time   NA 131 (L) 10/24/2020 0533   K 4.4 10/13/2020 0533   BUN 60 (H) 10/17/2020 0533   CREATININE 1.26 (H) 10/07/2020 0533   ALBUMIN 1.8 (L) 10/15/2020 0533     Review of Systems  Neurological: Positive for weakness.  All other systems reviewed and are negative. Unless otherwise noted, a complete review of systems is negative.  Physical Exam General: NAD, frail chronically-ill appearing Cardiovascular: regular rate and rhythm Pulmonary: Normal breathing pattern, rhonchi Abdomen: soft, nontender, + bowel sounds Extremities: Bilateral lower extremity paresis, edema Skin: To be decubitus ulcers to sacrum, scrotum, thighs (not observed), scattered lesions, left index finger with blood-filled blister Neurological: Awake, AA O x3, mood appropriate   Prognosis: (Poor) weeks-in the setting of GI bleed, symptomatic anemia, gastric ulcers, hiatal hernia, generalized weakness, MRSA bacteremia, UTI, large sacral wound, esophageal candidiasis, spinal  hemangioma s/p radiation (poor surgical candidate), bilateral lower extremity paresis, failure to thrive, albumin 1.8, deconditioned.   Discharge Planning:  Hospice facility  Recommendations: . DNR/DNI-as confirmed by patient and sister . As requested by patient and sister transitional care to focus on comfort/end-of-life . Discontinue medical interventions including radiation, minimize medications (I have spoken with Ebony Hail, [place] with Rad Onc) . Request for residential hospice facility with the preference of [facility] in [place].  (TOC aware) Morphine PRN for pain/air hunger/comfort Robinul PRN for excessive secretions Ativan PRN for agitation/anxiety Zofran PRN for nausea Liquifilm tears PRN for dry eyes Haldol PRN for agitation/anxiety May have comfort feeding Comfort cart for family Unrestricted visitations in the setting of EOL (per policy) Oxygen PRN 2L or less for comfort. No escalation.  Marland Kitchen PMT will continue to support and follow as needed. Please call team line with urgent needs.   Palliative Performance Scale: PPS 20%              Patient and  expressed understanding and was in agreement with this plan.   Thank you for allowing the Palliative Medicine Team to assist in the care of this patient. Please utilize secure chat with additional questions, if there is no response within 30 minutes please call the above phone number.   Time In: 1200 Time Out: 1315 Time Total: 75 min.   Visit consisted of counseling and education dealing with the complex and emotionally intense issues of symptom management and palliative care in the setting of serious and potentially life-threatening illness.Greater than 50%  of this time was spent counseling and coordinating care related to the above assessment and plan.  Signed by:  Alda Lea, AGPCNP-BC Palliative Medicine Team  Phone: [phone] Pager: [phone] Amion: [place] Team providers  are available by phone from 7am to 7pm daily and can be reached through the team cell phone.  Should this patient require assistance outside of these hours,  please call the patient's attending physician.

## 2020-10-23 NOTE — Progress Notes (Addendum)
[facility] for Infectious Disease    Date of Admission:  10/10/2020   Total days of antibiotics 5           ID: Timothy Neal is a 80 y.o. male with  Active Problems:   GIB (gastrointestinal bleeding)   Symptomatic anemia   Heme positive stool   Multiple gastric ulcers   Hiatal hernia   Acute pain of right shoulder   Generalized weakness   Elevated ALT measurement    Subjective: He reports having dry mouth, feeling very weak. He reports having worsening back pain and difficulty feeling comfortable with his legs  His sister at his bedside, who is his power of attorney, questions how much he can tolerate with further diagnostic testing  Medications:  . Chlorhexidine Gluconate Cloth  6 each Topical Daily  . collagenase   Topical Daily  . dexamethasone  12 mg Oral Q12H  . finasteride  5 mg Oral QHS  . fluconazole  100 mg Oral Daily  . Gerhardt's butt cream   Topical TID  . lidocaine  1 patch Transdermal Q24H  . pantoprazole  40 mg Oral BID  . sotalol  80 mg Oral Daily   And  . sotalol  120 mg Oral QHS  . tamsulosin  0.4 mg Oral Daily    Objective: Vital signs in last 24 hours: Temp:  [97.7 F (36.5 C)-98.2 F (36.8 C)] 98.2 F (36.8 C) (03/25 1316) Pulse Rate:  [69-70] 70 (03/25 1316) Resp:  [18-26] 26 (03/25 1316) BP: (142-156)/(68-77) 143/70 (03/25 1316) SpO2:  [90 %-100 %] 93 % (03/25 1316) Physical Exam  Constitutional: He is oriented to person, place, and time. He appears chronically ill and mal-nourished. No distress.  HENT:  Mouth/Throat: Oropharynx is clear and moist. No oropharyngeal exudate.  Cardiovascular: Normal rate, regular rhythm and normal heart sounds. Exam reveals no gallop and no friction rub.  No murmur heard.  Pulmonary/Chest: Effort normal and breath sounds normal. No respiratory distress. He has no wheezes.  Abdominal: Soft. Bowel sounds are normal. He exhibits no distension. There is no tenderness.  Neurological: He is alert and  oriented to person, place. Unable to move lower extremities  Skin: Skin is warm and dry. No rash noted. No erythema.  PJA:SNKN hand tenderness Psychiatric: slow speech. fatigued   Lab Results Recent Labs    10/22/20 0056 10/18/2020 0533  WBC 7.1 9.0  HGB 9.1* 9.2*  HCT 28.3* 28.5*  NA 131* 131*  K 4.2 4.4  CL 104 104  CO2 18* 19*  BUN 49* 60*  CREATININE 1.28* 1.26*   Liver Panel Recent Labs    10/22/20 0056 10/29/2020 0533  PROT 5.2* 5.2*  ALBUMIN 1.8* 1.8*  AST 29 25  ALT 94* 85*  ALKPHOS 54 53  BILITOT 1.0 1.0   Sedimentation Rate No results for input(s): ESRSEDRATE in the last 72 hours. C-Reactive Protein No results for input(s): CRP in the last 72 hours.  Microbiology: Blood cx 3/23 - NGTD Urine cx 3/21 - kleb pneumonaie Blood cx 3/21 - MRSA bacteremia Studies/Results: DG Chest 1 View  Result Date: 10/22/2020 CLINICAL DATA:  Cough. EXAM: CHEST  1 VIEW COMPARISON:  October 19, 2020. FINDINGS: Stable cardiomegaly. Status post coronary bypass graft. Left-sided pacemaker is unchanged in position. No pneumothorax or pleural effusion is noted. Lungs are clear. Bony thorax is unremarkable. IMPRESSION: No active disease. Electronically Signed   By: Marijo Conception M.D.   On: 10/22/2020 13:35  Assessment/Plan: Complicated mrsa bacteremia with sacral decub +/- left had abscess = continue with daptomycin for the time being. Will defer getting TEE due to worsening clinical state and that it may not necessarily change treatment course at this time  Left hand swelling/presumed abscess= recommend to see if IR can aspirate and fluid sent for culture to decompress the area, possibly provide relief from pain  kleb uti = treated  Sacral decub = continue with wound care recs  Worsening overall health with spinal mass and LE paralysis = his marked decline and prognosis is poor at this stage. His sister, Blanch Media, who is POA and retired Therapist, sports is also aware that her brother is likely  hospice//palliative bound. I am in agreement with her assessment of the situation. Spoke with dr Zigmund Daniel to get pallitative care involved for [place] discussion  Esophageal candidiasis = continue on fluconazole until [place] discussion  For now will continue on abtx.  Spent 35 min with patient, his poa discussion treatment options, and discussion with dr Rodena Piety.  [facility] Infectious Diseases Cell: [phone] Pager: [phone]  10/10/2020, 5:03 PM

## 2020-10-23 NOTE — Progress Notes (Signed)
PT Cancellation Note  Patient Details Name: Timothy Neal MRN: [id number] DOB: 1940/12/14   Cancelled Treatment:    Reason Eval/Treat Not Completed: Patient going for  procedure today to [facility] .Claretha Cooper 10/28/2020, 8:15 AM  [place] Pager [phone] Office [phone]

## 2020-10-24 DIAGNOSIS — R531 Weakness: Secondary | ICD-10-CM | POA: Diagnosis not present

## 2020-10-24 DIAGNOSIS — D649 Anemia, unspecified: Secondary | ICD-10-CM | POA: Diagnosis not present

## 2020-10-24 NOTE — Progress Notes (Signed)
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 Presenting with increased weakness over the last week with anemia as well as dark stools. 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 He normally is able to use a walker to move at least 15 yards per his sister at bedside. Over the last week, he's had increasing overall weakness and lethargy; as well as an acute inability to use his walker or move on his own. She notes that his sacral wound seems worse and may have had some subjective fever. He c/o on shoulder pain, but she notes that he had been putting extra strain on that right shoulder. She believes that he may have injured it during those exercises.  Of note, patient came from [facility].  In the ED, pt was found to have a 3.5 pt drop in his Hgb and he was FOBT positive. GI was consulted. There was concern for worsening BLE weakness. Neurosurgery was consulted. L hand cellulitis was noted. Blood cx noted to grow MRSA. Patient was started on vanc. TRH was called for admission.   Assessment & Plan:   Active Problems:   GIB (gastrointestinal bleeding)   Symptomatic anemia   Heme positive stool   Multiple gastric ulcers   Hiatal hernia   Acute pain of right shoulder   Generalized weakness   Elevated ALT measurement  #1 MRSA bacteremia/Klebsiella UTI -seen by ID started on daptomycin.   Transthoracic echo -left ventricular ejection fraction 25 to 30%.  Severely decreased function.  Global hypokinesis.  Moderate asymmetric left ventricular hypertrophy of the basal septal segment.  RV systolic function is moderately reduced.  Moderately elevated  pulmonary artery systolic pressure.  Moderate AR moderate AS Cancelled TEE as family and patient wants to move towards palliative care and may be hospice  Received cefepime for uti  #2 sacral wound present on admission-.  Patient has numerous skin lesions to buttocks, unstageable pressure injury to the left ilial tuberosity, weeping areas of partial skin loss to the bilateral lower extremities erythema and skin loss right buttock scrotum and medial thighs, trauma to the left hand and first digit.  #3 esophageal candidiasis-was on fluconazole, stopped as patient going to be comfort care.  #4 upper GI bleed -likely secondary to [facility] lesions in the setting of Eliquis use.  Status post EGD with Lysbeth Galas lesions, esophageal plaques consistent with esophageal candidiasis.  10 nonbleeding gastric ulcers with no stigmata of bleeding  H. pylori testing was done pending.  #5 spinal hemangioma-was getting radiation which is also been stopped as patient going into comfort care.  #6 protein calorie malnutrition continue and appreciate dietary input.ALB 1.8.  #7 hypokalemia potassium 4.2 magnesium 2.1.  3.4 repleted.   #8 right shoulder pain secondary to partial tear of the subscapularis and supraspinatus tendons.  Small right glenohumeral joint effusion and mild joint arthritis.   Estimated body mass index is 24.86 kg/m as calculated from the following:   Height as of this encounter: 5\' 7"  (1.702 m).   Weight as of this encounter: 72 kg.  DVT prophylaxis: none GI bleed Code Status: DO NOT RESUSCITATE discussed in detail with patient's Sister Blanch Media who is the POA  and she brought in his living will.  Comfort care family Communication:kevin nephew [phone]  disposition Plan:  Status is: Inpatient   Dispo: The patient is from: assisted living              Anticipated d/c is to: SNF              Patient currently is not medically stable to d/c.   Difficult to place patient na   Consultants: GI  neurosurgery infectious disease  Procedures:   Antimicrobials   Subjective:  He is resting in bed very weak no events overnight  Objective: Vitals:   10/22/20 2200 10/05/2020 0549 10/22/2020 1316 10/24/20 0641  BP:  (!) 152/68 (!) 143/70 (!) 162/72  Pulse:  69 70 70  Resp:  20 (!) 26 18  Temp:  98.2 F (36.8 C) 98.2 F (36.8 C) 98.2 F (36.8 C)  TempSrc:  Oral Oral Oral  SpO2: 100% 90% 93% 93%  Weight:      Height:        Intake/Output Summary (Last 24 hours) at 10/24/2020 1059 Last data filed at 10/24/2020 1019 Gross per 24 hour  Intake 240 ml  Output 950 ml  Net -710 ml   Filed Weights   10/17/2020 2100  Weight: 72 kg    Examination:  General exam: frail elderly male in distress due to pain and discomfort  Respiratory system: Coarse breath sounds and rhonchi  to auscultation. Respiratory effort normal. Cardiovascular system: S1 & S2 heard, RRR. No JVD, murmurs, rubs, gallops or clicks. No pedal edema. Gastrointestinal system: Abdomen is nondistended, soft and nontender. No organomegaly or masses felt. Normal bowel sounds heard. Central nervous system: 3 x 5 in both lower extremities extremities: 1+ edema Skin: Multiple excoriated lesions in the lower extremities and multiple decubitus lesions in the sacrum and scrotum and medial thighs.   Index finger with a small blood-filled blister from from injury during nail cutting. Psychiatry: Judgement and insight appear normal. Mood & affect appropriate.     Data Reviewed: I have personally reviewed following labs and imaging studies  CBC: Recent Labs  Lab 10/25/2020 1013 10/13/2020 1537 10/28/2020 1420 10/10/2020 1921 10/21/20 0238 10/21/20 1636 10/22/20 0056 10/12/2020 0533  WBC 5.8   < > 7.1 7.0 8.4  --  7.1 9.0  NEUTROABS 5.3  --   --   --  7.8*  --   --   --   HGB 8.2*   < > 8.8* 9.4* 7.8* 9.8* 9.1* 9.2*  HCT 24.8*   < > 27.2* 29.0* 24.0* 30.2* 28.3* 28.5*  MCV 88.3   < > 89.8 90.9 90.6  --  91.6 89.6  PLT 106*   <  > 125* 124* 145*  --  149* 162   < > = values in this interval not displayed.   Basic Metabolic Panel: Recent Labs  Lab 10/13/2020 1013 10/13/2020 2352 10/21/20 0238 10/22/20 0056 10/27/2020 0533  NA 134* 132* 135 131* 131*  K 3.6 3.5 3.4* 4.2 4.4  CL 100 99 105 104 104  CO2 27 23 20* 18* 19*  GLUCOSE 121* 104* 234* 209* 260*  BUN 40* 39* 46* 49* 60*  CREATININE 1.38* 1.29* 1.37* 1.28* 1.26*  CALCIUM 9.4 9.2 9.4 9.4 9.4  MG  --   --  2.1  --   --    GFR: Estimated Creatinine Clearance: 45.2 mL/min (A) (by C-G formula based on SCr of 1.26 mg/dL (H)). Liver Function  Tests: Recent Labs  Lab 10/03/2020 1013 10/16/2020 2352 10/22/20 0056 10/22/2020 0533  AST 32 32 29 25  ALT 69* 69* 94* 85*  ALKPHOS 43 44 54 53  BILITOT 0.9 1.0 1.0 1.0  PROT 4.9* 4.9* 5.2* 5.2*  ALBUMIN 1.7* 1.7* 1.8* 1.8*   No results for input(s): LIPASE, AMYLASE in the last 168 hours. No results for input(s): AMMONIA in the last 168 hours. Coagulation Profile: Recent Labs  Lab 10/02/2020 1013  INR 2.2*   Cardiac Enzymes: Recent Labs  Lab 10/21/20 0238  CKTOTAL 16*   BNP (last 3 results) No results for input(s): PROBNP in the last 8760 hours. HbA1C: No results for input(s): HGBA1C in the last 72 hours. CBG: Recent Labs  Lab 10/22/20 1217 10/22/20 1730 10/22/20 2211 10/18/2020 0802 10/01/2020 1143  GLUCAP 166* 291* 330* 220* 195*   Lipid Profile: No results for input(s): CHOL, HDL, LDLCALC, TRIG, CHOLHDL, LDLDIRECT in the last 72 hours. Thyroid Function Tests: No results for input(s): TSH, T4TOTAL, FREET4, T3FREE, THYROIDAB in the last 72 hours. Anemia Panel: No results for input(s): VITAMINB12, FOLATE, FERRITIN, TIBC, IRON, RETICCTPCT in the last 72 hours. Sepsis Labs: Recent Labs  Lab 10/10/2020 1013 10/28/2020 1213  LATICACIDVEN 1.0 1.1    Recent Results (from the past 240 hour(s))  Blood Culture (routine x 2)     Status: Abnormal   Collection Time: 10/27/2020 10:00 AM   Specimen: BLOOD LEFT  HAND  Result Value Ref Range Status   Specimen Description   Final    BLOOD LEFT HAND Performed at [facility], [place] [address] [place], [place] [address]    Special Requests   Final    BOTTLES DRAWN AEROBIC AND ANAEROBIC Blood Culture results may not be optimal due to an inadequate volume of blood received in culture bottles Performed at [place] [address], [place], [place] [address]    Culture  Setup Time   Final    IN BOTH AEROBIC AND ANAEROBIC BOTTLES GRAM POSITIVE COCCI CRITICAL VALUE NOTED.  VALUE IS CONSISTENT WITH PREVIOUSLY REPORTED AND CALLED VALUE.    Culture (A)  Final    STAPHYLOCOCCUS AUREUS SUSCEPTIBILITIES PERFORMED ON PREVIOUS CULTURE WITHIN THE LAST 5 DAYS. Performed at [facility], [place] [address] [place], [place] [address]    Report Status 10/22/2020 FINAL  Final  Resp Panel by RT-PCR (Flu A&B, Covid) Nasopharyngeal Swab     Status: None   Collection Time: 10/25/2020 10:13 AM   Specimen: Nasopharyngeal Swab; Nasopharyngeal(NP) swabs in vial transport medium  Result Value Ref Range Status   SARS Coronavirus 2 by RT PCR NEGATIVE NEGATIVE Final    Comment: (NOTE) SARS-CoV-2 target nucleic acids are NOT DETECTED.  The SARS-CoV-2 RNA is generally detectable in upper respiratory specimens during the acute phase of infection. The lowest concentration of SARS-CoV-2 viral copies this assay can detect is 138 copies/mL. A negative result does not preclude SARS-Cov-2 infection and should not be used as the sole basis for treatment or other patient management decisions. A negative result may occur with  improper specimen collection/handling, submission of specimen other than nasopharyngeal swab, presence of viral mutation(s) within the areas targeted by this assay, and inadequate number of viral copies(<138 copies/mL). A negative result must be combined with clinical observations, patient history, and  epidemiological information. The expected result is Negative.  Fact Sheet for Patients:  EntrepreneurPulse.com.au  Fact Sheet for Healthcare Providers:  IncredibleEmployment.be  This test is no t  yet approved or cleared by the [place] and  has been authorized for detection and/or diagnosis of SARS-CoV-2 by FDA under an Emergency Use Authorization (EUA). This EUA will remain  in effect (meaning this test can be used) for the duration of the COVID-19 declaration under Section 564(b)(1) of the Act, 21 U.S.C.section 360bbb-3(b)(1), unless the authorization is terminated  or revoked sooner.       Influenza A by PCR NEGATIVE NEGATIVE Final   Influenza B by PCR NEGATIVE NEGATIVE Final    Comment: (NOTE) The Xpert Xpress SARS-CoV-2/FLU/RSV plus assay is intended as an aid in the diagnosis of influenza from Nasopharyngeal swab specimens and should not be used as a sole basis for treatment. Nasal washings and aspirates are unacceptable for Xpert Xpress SARS-CoV-2/FLU/RSV testing.  Fact Sheet for Patients: EntrepreneurPulse.com.au  Fact Sheet for Healthcare Providers: IncredibleEmployment.be  This test is not yet approved or cleared by the [place] FDA and has been authorized for detection and/or diagnosis of SARS-CoV-2 by FDA under an Emergency Use Authorization (EUA). This EUA will remain in effect (meaning this test can be used) for the duration of the COVID-19 declaration under Section 564(b)(1) of the Act, 21 U.S.C. section 360bbb-3(b)(1), unless the authorization is terminated or revoked.  Performed at [facility], [place] [address] [place], [place] [address]   Blood Culture (routine x 2)     Status: Abnormal   Collection Time: 10/28/2020 10:15 AM   Specimen: BLOOD  Result Value Ref Range Status   Specimen Description   Final    BLOOD LEFT ANTECUBITAL Performed at [place] [address] [place], [place] [address]    Special Requests   Final    BOTTLES DRAWN AEROBIC AND ANAEROBIC Blood Culture results may not be optimal due to an inadequate volume of blood received in culture bottles Performed at [place] [address] [place], [place] [address]    Culture  Setup Time   Final    IN BOTH AEROBIC AND ANAEROBIC BOTTLES GRAM POSITIVE COCCI CRITICAL RESULT CALLED TO, READ BACK BY AND VERIFIED WITHSeleta Rhymes [facility] 10/16/2020 0245 JDW Performed at [facility], [place] [address], [place], [place] [address]    Culture METHICILLIN RESISTANT STAPHYLOCOCCUS AUREUS (A)  Final   Report Status 10/22/2020 FINAL  Final   Organism ID, Bacteria METHICILLIN RESISTANT STAPHYLOCOCCUS AUREUS  Final      Susceptibility   Methicillin resistant staphylococcus aureus - MIC*    CIPROFLOXACIN >=8 RESISTANT Resistant     ERYTHROMYCIN >=8 RESISTANT Resistant     GENTAMICIN <=0.5 SENSITIVE Sensitive     OXACILLIN >=4 RESISTANT Resistant     TETRACYCLINE <=1 SENSITIVE Sensitive     VANCOMYCIN <=0.5 SENSITIVE Sensitive     TRIMETH/SULFA <=10 SENSITIVE Sensitive     CLINDAMYCIN RESISTANT Resistant     RIFAMPIN <=0.5 SENSITIVE Sensitive     Inducible Clindamycin POSITIVE Resistant     * METHICILLIN RESISTANT STAPHYLOCOCCUS AUREUS  Blood Culture ID Panel (Reflexed)     Status: Abnormal   Collection Time: 10/14/2020 10:15 AM  Result Value Ref Range Status   Enterococcus faecalis NOT DETECTED NOT DETECTED Final   Enterococcus Faecium NOT DETECTED NOT DETECTED Final   Listeria monocytogenes NOT DETECTED NOT DETECTED Final   Staphylococcus species DETECTED (A) NOT DETECTED Final    Comment: CRITICAL RESULT CALLED TO, READ BACK BY AND VERIFIED WITH: E JACKSON PHARMD 10/27/2020 0245 JDW  Staphylococcus aureus (BCID) DETECTED (A) NOT DETECTED Final    Comment: Methicillin (oxacillin)-resistant Staphylococcus aureus (MRSA). MRSA is  predictably resistant to beta-lactam antibiotics (except ceftaroline). Preferred therapy is vancomycin unless clinically contraindicated. Patient requires contact precautions if  hospitalized. CRITICAL RESULT CALLED TO, READ BACK BY AND VERIFIED WITH: E JACKSON PHARMD 10/23/2020 0245 JDW    Staphylococcus epidermidis NOT DETECTED NOT DETECTED Final   Staphylococcus lugdunensis NOT DETECTED NOT DETECTED Final   Streptococcus species NOT DETECTED NOT DETECTED Final   Streptococcus agalactiae NOT DETECTED NOT DETECTED Final   Streptococcus pneumoniae NOT DETECTED NOT DETECTED Final   Streptococcus pyogenes NOT DETECTED NOT DETECTED Final   A.calcoaceticus-baumannii NOT DETECTED NOT DETECTED Final   Bacteroides fragilis NOT DETECTED NOT DETECTED Final   Enterobacterales NOT DETECTED NOT DETECTED Final   Enterobacter cloacae complex NOT DETECTED NOT DETECTED Final   Escherichia coli NOT DETECTED NOT DETECTED Final   Klebsiella aerogenes NOT DETECTED NOT DETECTED Final   Klebsiella oxytoca NOT DETECTED NOT DETECTED Final   Klebsiella pneumoniae NOT DETECTED NOT DETECTED Final   Proteus species NOT DETECTED NOT DETECTED Final   Salmonella species NOT DETECTED NOT DETECTED Final   Serratia marcescens NOT DETECTED NOT DETECTED Final   Haemophilus influenzae NOT DETECTED NOT DETECTED Final   Neisseria meningitidis NOT DETECTED NOT DETECTED Final   Pseudomonas aeruginosa NOT DETECTED NOT DETECTED Final   Stenotrophomonas maltophilia NOT DETECTED NOT DETECTED Final   Candida albicans NOT DETECTED NOT DETECTED Final   Candida auris NOT DETECTED NOT DETECTED Final   Candida glabrata NOT DETECTED NOT DETECTED Final   Candida krusei NOT DETECTED NOT DETECTED Final   Candida parapsilosis NOT DETECTED NOT DETECTED Final   Candida tropicalis NOT DETECTED NOT DETECTED Final   Cryptococcus neoformans/gattii NOT DETECTED NOT DETECTED Final   Meth resistant mecA/C and MREJ DETECTED (A) NOT DETECTED Final     Comment: CRITICAL RESULT CALLED TO, READ BACK BY AND VERIFIED WITHSeleta Rhymes [facility] 10/04/2020 0245 JDW Performed at [facility], [address], [place], [place] [address]   Urine culture     Status: Abnormal   Collection Time: 10/20/2020 11:02 AM   Specimen: In/Out Cath Urine  Result Value Ref Range Status   Specimen Description   Final    IN/OUT CATH URINE Performed at [place] [address], [place], [place] [address]    Special Requests   Final    NONE Performed at [facility], [place] [address] [place], [place] [address]    Culture >=100,000 COLONIES/mL KLEBSIELLA PNEUMONIAE (A)  Final   Report Status 10/21/2020 FINAL  Final   Organism ID, Bacteria KLEBSIELLA PNEUMONIAE (A)  Final      Susceptibility   Klebsiella pneumoniae - MIC*    AMPICILLIN >=32 RESISTANT Resistant     CEFAZOLIN <=4 SENSITIVE Sensitive     CEFEPIME <=0.12 SENSITIVE Sensitive     CEFTRIAXONE <=0.25 SENSITIVE Sensitive     CIPROFLOXACIN <=0.25 SENSITIVE Sensitive     GENTAMICIN <=1 SENSITIVE Sensitive     IMIPENEM <=0.25 SENSITIVE Sensitive     NITROFURANTOIN 128 RESISTANT Resistant     TRIMETH/SULFA >=320 RESISTANT Resistant     AMPICILLIN/SULBACTAM >=32 RESISTANT Resistant     PIP/TAZO 32 INTERMEDIATE Intermediate     * >=100,000 COLONIES/mL KLEBSIELLA PNEUMONIAE  Culture, blood (routine x 2)     Status: None (Preliminary result)   Collection Time: 10/21/20  2:38 AM   Specimen: BLOOD  Result  Value Ref Range Status   Specimen Description   Final    BLOOD LEFT HAND Performed at [facility] [address], [place], [place] [address]    Special Requests   Final    BOTTLES DRAWN AEROBIC ONLY Blood Culture adequate volume Performed at [facility] [address], [place], [place] [address]    Culture   Final    NO GROWTH 2 DAYS Performed at [facility] [address], [place], [place] [address]    Report  Status PENDING  Incomplete  Culture, blood (routine x 2)     Status: None (Preliminary result)   Collection Time: 10/21/20  2:39 AM   Specimen: BLOOD  Result Value Ref Range Status   Specimen Description   Final    BLOOD BLOOD RIGHT FOREARM Performed at [facility] [place] [address] [place], [place] [address]    Special Requests   Final    BOTTLES DRAWN AEROBIC ONLY Blood Culture results may not be optimal due to an inadequate volume of blood received in culture bottles Performed at [place] [address], [place], [place] [address]    Culture   Final    NO GROWTH 2 DAYS Performed at [facility] [address], [place],  [address]    Report Status PENDING  Incomplete         Radiology Studies: DG Chest 1 View  Result Date: 10/22/2020 CLINICAL DATA:  Cough. EXAM: CHEST  1 VIEW COMPARISON:  October 19, 2020. FINDINGS: Stable cardiomegaly. Status post coronary bypass graft. Left-sided pacemaker is unchanged in position. No pneumothorax or pleural effusion is noted. Lungs are clear. Bony thorax is unremarkable. IMPRESSION: No active disease. Electronically Signed   By: Marijo Conception M.D.   On: 10/22/2020 13:35        Scheduled Meds: . Chlorhexidine Gluconate Cloth  6 each Topical Daily  . collagenase   Topical Daily  . dexamethasone  12 mg Oral Q12H  . finasteride  5 mg Oral QHS  . fluconazole  100 mg Oral Daily  . Gerhardt's butt cream   Topical TID  . lidocaine  1 patch Transdermal Q24H  . pantoprazole  40 mg Oral BID  . sotalol  80 mg Oral Daily   And  . sotalol  120 mg Oral QHS  . tamsulosin  0.4 mg Oral Daily   Continuous Infusions:    LOS: 3 days   Georgette Shell, MD 10/24/2020, 10:59 AM

## 2020-10-24 NOTE — TOC Progression Note (Addendum)
Transition of Care [facility]) - Progression Note    Patient Details  Name: Timothy Neal MRN: [id number] Date of Birth: Mar 27, 1941  [facility] CM/SW Contact  Lennart Pall, LCSW Phone Number: 10/24/2020, 3:38 PM  Clinical Narrative:    Spoke with pt's sister, Raeden Schippers, to confirm pt/ family wish to pursue residential Hospice placement and choosing [facility].  Have placed a call to [facility] and am awaiting return call to begin referral process.  TOC will continue to follow and keep treatment team, pt and family updated.   ADDENDUM:  Received call from Cylinder [phone]) with [facility] and she notes they will review case for residential hospice placement and let TOC know if accepted.  She does note that their program requires 2 weeks up front payment from the pt/family of the per diem rate ($225).  I have informed the sister and nephew and they have agreed to this and wish to proceed with [facility].  Currently, awaiting someone from [facility] to call back with fax # of where to send pt's information.    Expected Discharge Plan: [place] Barriers to Discharge: Continued Medical Work up  Expected Discharge Plan and Services Expected Discharge Plan: [place]   Discharge Planning Services: CM Consult   Living arrangements for the past 2 months: [place]                                       Social Determinants of Health (SDOH) Interventions    Readmission Risk Interventions No flowsheet data found.

## 2020-10-25 DIAGNOSIS — D649 Anemia, unspecified: Secondary | ICD-10-CM | POA: Diagnosis not present

## 2020-10-25 DIAGNOSIS — R531 Weakness: Secondary | ICD-10-CM | POA: Diagnosis not present

## 2020-10-25 NOTE — Progress Notes (Signed)
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 Presenting with increased weakness over the last week with anemia as well as dark stools. 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 He normally is able to use a walker to move at least 15 yards per his sister at bedside. Over the last week, he's had increasing overall weakness and lethargy; as well as an acute inability to use his walker or move on his own. She notes that his sacral wound seems worse and may have had some subjective fever. He c/o on shoulder pain, but she notes that he had been putting extra strain on that right shoulder. She believes that he may have injured it during those exercises.  Of note, patient came from [facility].  In the ED, pt was found to have a 3.5 pt drop in his Hgb and he was FOBT positive. GI was consulted. There was concern for worsening BLE weakness. Neurosurgery was consulted. L hand cellulitis was noted. Blood cx noted to grow MRSA. Patient was started on vanc. TRH was called for admission.   Assessment & Plan:   Active Problems:   GIB (gastrointestinal bleeding)   Symptomatic anemia   Heme positive stool   Multiple gastric ulcers   Hiatal hernia   Acute pain of right shoulder   Generalized weakness   Elevated ALT measurement  #1 MRSA bacteremia/Klebsiella UTI -seen by ID started on daptomycin.   Transthoracic echo -left ventricular ejection fraction 25 to 30%.  Severely decreased function.  Global hypokinesis.  Moderate asymmetric left ventricular hypertrophy of the basal septal segment.  RV systolic function is moderately reduced.  Moderately elevated  pulmonary artery systolic pressure.  Moderate AR moderate AS Cancelled TEE as family and patient wants to move towards palliative care and may be hospice  Received cefepime for uti  #2 sacral wound present on admission-.  Patient has numerous skin lesions to buttocks, unstageable pressure injury to the left ilial tuberosity, weeping areas of partial skin loss to the bilateral lower extremities erythema and skin loss right buttock scrotum and medial thighs, trauma to the left hand and first digit.  #3 esophageal candidiasis-was on fluconazole, stopped as patient going to be comfort care.  #4 upper GI bleed -likely secondary to [facility] lesions in the setting of Eliquis use.  Status post EGD with Lysbeth Galas lesions, esophageal plaques consistent with esophageal candidiasis.  10 nonbleeding gastric ulcers with no stigmata of bleeding  H. pylori testing was done pending.  #5 spinal hemangioma-was getting radiation which is also been stopped as patient going into comfort care.  #6 protein calorie malnutrition continue and appreciate dietary input.ALB 1.8.  #7 hypokalemia potassium 4.2 magnesium 2.1.  3.4 repleted.   #8 right shoulder pain secondary to partial tear of the subscapularis and supraspinatus tendons.  Small right glenohumeral joint effusion and mild joint arthritis.   Estimated body mass index is 24.86 kg/m as calculated from the following:   Height as of this encounter: 5\' 7"  (1.702 m).   Weight as of this encounter: 72 kg.  DVT prophylaxis: none GI bleed Code Status: DO NOT RESUSCITATE discussed in detail with patient's Sister Blanch Media who is the POA  and she brought in his living will.  Comfort care family Communication-dw sister  disposition Plan:  Status is: Inpatient expecting hospital death   Dispo: The patient is from: assisted living              Anticipated d/c is to: SNF              Patient currently is not medically stable to d/c.   Difficult to place patient na    Consultants: GI neurosurgery infectious disease  Procedures:   Antimicrobials   Subjective:  More weaker than yesterday.  Objective: Vitals:   10/09/2020 0549 10/22/2020 1316 10/24/20 0641 10/25/20 0429  BP: (!) 152/68 (!) 143/70 (!) 162/72 (!) 152/105  Pulse: 69 70 70 69  Resp: 20 (!) 26 18 16   Temp: 98.2 F (36.8 C) 98.2 F (36.8 C) 98.2 F (36.8 C) 98.8 F (37.1 C)  TempSrc: Oral Oral Oral Oral  SpO2: 90% 93% 93% 97%  Weight:      Height:        Intake/Output Summary (Last 24 hours) at 10/25/2020 1109 Last data filed at 10/25/2020 0430 Gross per 24 hour  Intake --  Output 1800 ml  Net -1800 ml   Filed Weights   10/03/2020 2100  Weight: 72 kg    Examination:  General exam: frail elderly male in distress due to pain and discomfort  Respiratory system: Coarse breath sounds and rhonchi  to auscultation. Respiratory effort normal. Cardiovascular system: S1 & S2 heard, RRR. No JVD, murmurs, rubs, gallops or clicks. No pedal edema. Gastrointestinal system: Abdomen is nondistended, soft and nontender. No organomegaly or masses felt. Normal bowel sounds heard. Central nervous system: 3 x 5 in both lower extremities extremities: 1+ edema Skin: Multiple excoriated lesions in the lower extremities and multiple decubitus lesions in the sacrum and scrotum and medial thighs.   Index finger with a small blood-filled blister from from injury during nail cutting. Psychiatry: Judgement and insight appear normal. Mood & affect appropriate.     Data Reviewed: I have personally reviewed following labs and imaging studies  CBC: Recent Labs  Lab 10/21/2020 1013 10/17/2020 1537 10/07/2020 1420 10/17/2020 1921 10/21/20 0238 10/21/20 1636 10/22/20 0056 10/15/2020 0533  WBC 5.8   < > 7.1 7.0 8.4  --  7.1 9.0  NEUTROABS 5.3  --   --   --  7.8*  --   --   --   HGB 8.2*   < > 8.8* 9.4* 7.8* 9.8* 9.1* 9.2*  HCT 24.8*   < > 27.2* 29.0* 24.0* 30.2* 28.3* 28.5*  MCV 88.3   < > 89.8 90.9 90.6   --  91.6 89.6  PLT 106*   < > 125* 124* 145*  --  149* 162   < > = values in this interval not displayed.   Basic Metabolic Panel: Recent Labs  Lab 10/21/2020 1013 10/25/2020 2352 10/21/20 0238 10/22/20 0056 10/23/20 0533  NA 134* 132* 135 131* 131*  K 3.6 3.5 3.4* 4.2 4.4  CL 100 99 105 104 104  CO2 27 23 20* 18* 19*  GLUCOSE 121* 104* 234* 209* 260*  BUN 40* 39* 46* 49* 60*  CREATININE 1.38* 1.29* 1.37* 1.28* 1.26*  CALCIUM 9.4 9.2 9.4 9.4 9.4  MG  --   --  2.1  --   --    GFR: Estimated Creatinine Clearance: 45.2 mL/min (A) (by C-G formula based on SCr of 1.26 mg/dL (H)). Liver Function Tests: Recent Labs  Lab 10/25/2020 1013 10/10/2020 2352 10/22/20 0056 10/22/2020 0533  AST 32 32 29 25  ALT 69* 69* 94* 85*  ALKPHOS 43 44 54 53  BILITOT 0.9 1.0 1.0 1.0  PROT 4.9* 4.9* 5.2* 5.2*  ALBUMIN 1.7* 1.7* 1.8* 1.8*   No results for input(s): LIPASE, AMYLASE in the last 168 hours. No results for input(s): AMMONIA in the last 168 hours. Coagulation Profile: Recent Labs  Lab 10/25/2020 1013  INR 2.2*   Cardiac Enzymes: Recent Labs  Lab 10/21/20 0238  CKTOTAL 16*   BNP (last 3 results) No results for input(s): PROBNP in the last 8760 hours. HbA1C: No results for input(s): HGBA1C in the last 72 hours. CBG: Recent Labs  Lab 10/22/20 1217 10/22/20 1730 10/22/20 2211 10/17/2020 0802 10/23/20 1143  GLUCAP 166* 291* 330* 220* 195*   Lipid Profile: No results for input(s): CHOL, HDL, LDLCALC, TRIG, CHOLHDL, LDLDIRECT in the last 72 hours. Thyroid Function Tests: No results for input(s): TSH, T4TOTAL, FREET4, T3FREE, THYROIDAB in the last 72 hours. Anemia Panel: No results for input(s): VITAMINB12, FOLATE, FERRITIN, TIBC, IRON, RETICCTPCT in the last 72 hours. Sepsis Labs: Recent Labs  Lab 10/15/2020 1013 10/15/2020 1213  LATICACIDVEN 1.0 1.1    Recent Results (from the past 240 hour(s))  Blood Culture (routine x 2)     Status: Abnormal   Collection Time: 10/25/2020 10:00  AM   Specimen: BLOOD LEFT HAND  Result Value Ref Range Status   Specimen Description   Final    BLOOD LEFT HAND Performed at [facility], [place] [address], [place], [place] [address]    Special Requests   Final    BOTTLES DRAWN AEROBIC AND ANAEROBIC Blood Culture results may not be optimal due to an inadequate volume of blood received in culture bottles Performed at [facility] [address], [place], [place] [address]    Culture  Setup Time   Final    IN BOTH AEROBIC AND ANAEROBIC BOTTLES GRAM POSITIVE COCCI CRITICAL VALUE NOTED.  VALUE IS CONSISTENT WITH PREVIOUSLY REPORTED AND CALLED VALUE.    Culture (A)  Final    STAPHYLOCOCCUS AUREUS SUSCEPTIBILITIES PERFORMED ON PREVIOUS CULTURE WITHIN THE LAST 5 DAYS. Performed at [facility], [place] [address] [place], [place] [address]    Report Status 10/22/2020 FINAL  Final  Resp Panel by RT-PCR (Flu A&B, Covid) Nasopharyngeal Swab     Status: None   Collection Time: 10/13/2020 10:13 AM   Specimen: Nasopharyngeal Swab; Nasopharyngeal(NP) swabs in vial transport medium  Result Value Ref Range Status   SARS Coronavirus 2 by RT PCR NEGATIVE NEGATIVE Final    Comment: (NOTE) SARS-CoV-2 target nucleic acids are NOT DETECTED.  The SARS-CoV-2 RNA is generally detectable in upper respiratory specimens during the acute phase of infection. The lowest concentration of SARS-CoV-2 viral copies this assay can detect is 138 copies/mL. A negative result does not preclude SARS-Cov-2 infection and should not be used as the sole basis for treatment or other patient management decisions. A negative result may occur with  improper specimen collection/handling, submission of specimen other than nasopharyngeal swab, presence of viral mutation(s) within the areas targeted by this assay, and inadequate number of viral copies(<138 copies/mL). A negative result must be combined with clinical observations, patient  history, and epidemiological information. The expected result is Negative.  Fact Sheet for Patients:  EntrepreneurPulse.com.au  Fact Sheet for Healthcare Providers:  IncredibleEmployment.be  This test is no t yet approved or cleared  by the [place] and  has been authorized for detection and/or diagnosis of SARS-CoV-2 by FDA under an Emergency Use Authorization (EUA). This EUA will remain  in effect (meaning this test can be used) for the duration of the COVID-19 declaration under Section 564(b)(1) of the Act, 21 U.S.C.section 360bbb-3(b)(1), unless the authorization is terminated  or revoked sooner.       Influenza A by PCR NEGATIVE NEGATIVE Final   Influenza B by PCR NEGATIVE NEGATIVE Final    Comment: (NOTE) The Xpert Xpress SARS-CoV-2/FLU/RSV plus assay is intended as an aid in the diagnosis of influenza from Nasopharyngeal swab specimens and should not be used as a sole basis for treatment. Nasal washings and aspirates are unacceptable for Xpert Xpress SARS-CoV-2/FLU/RSV testing.  Fact Sheet for Patients: EntrepreneurPulse.com.au  Fact Sheet for Healthcare Providers: IncredibleEmployment.be  This test is not yet approved or cleared by the [place] FDA and has been authorized for detection and/or diagnosis of SARS-CoV-2 by FDA under an Emergency Use Authorization (EUA). This EUA will remain in effect (meaning this test can be used) for the duration of the COVID-19 declaration under Section 564(b)(1) of the Act, 21 U.S.C. section 360bbb-3(b)(1), unless the authorization is terminated or revoked.  Performed at [facility], [place] [address] [place], [place] [address]   Blood Culture (routine x 2)     Status: Abnormal   Collection Time: 09/29/2020 10:15 AM   Specimen: BLOOD  Result Value Ref Range Status   Specimen Description   Final    BLOOD LEFT  ANTECUBITAL Performed at [facility] [address] [place], [place] [address]    Special Requests   Final    BOTTLES DRAWN AEROBIC AND ANAEROBIC Blood Culture results may not be optimal due to an inadequate volume of blood received in culture bottles Performed at [facility] [address] [place], [place] [address]    Culture  Setup Time   Final    IN BOTH AEROBIC AND ANAEROBIC BOTTLES GRAM POSITIVE COCCI CRITICAL RESULT CALLED TO, READ BACK BY AND VERIFIED WITHSeleta Rhymes [facility] 10/08/2020 0245 JDW Performed at [facility], [place] [address], [place], [place] [address]    Culture METHICILLIN RESISTANT STAPHYLOCOCCUS AUREUS (A)  Final   Report Status 10/22/2020 FINAL  Final   Organism ID, Bacteria METHICILLIN RESISTANT STAPHYLOCOCCUS AUREUS  Final      Susceptibility   Methicillin resistant staphylococcus aureus - MIC*    CIPROFLOXACIN >=8 RESISTANT Resistant     ERYTHROMYCIN >=8 RESISTANT Resistant     GENTAMICIN <=0.5 SENSITIVE Sensitive     OXACILLIN >=4 RESISTANT Resistant     TETRACYCLINE <=1 SENSITIVE Sensitive     VANCOMYCIN <=0.5 SENSITIVE Sensitive     TRIMETH/SULFA <=10 SENSITIVE Sensitive     CLINDAMYCIN RESISTANT Resistant     RIFAMPIN <=0.5 SENSITIVE Sensitive     Inducible Clindamycin POSITIVE Resistant     * METHICILLIN RESISTANT STAPHYLOCOCCUS AUREUS  Blood Culture ID Panel (Reflexed)     Status: Abnormal   Collection Time: 10/27/2020 10:15 AM  Result Value Ref Range Status   Enterococcus faecalis NOT DETECTED NOT DETECTED Final   Enterococcus Faecium NOT DETECTED NOT DETECTED Final   Listeria monocytogenes NOT DETECTED NOT DETECTED Final   Staphylococcus species DETECTED (A) NOT DETECTED Final    Comment: CRITICAL RESULT CALLED TO, READ BACK BY AND VERIFIED WITH: E JACKSON PHARMD 10/29/2020 0245 JDW    Staphylococcus aureus (BCID) DETECTED (  A) NOT DETECTED Final    Comment: Methicillin (oxacillin)-resistant  Staphylococcus aureus (MRSA). MRSA is predictably resistant to beta-lactam antibiotics (except ceftaroline). Preferred therapy is vancomycin unless clinically contraindicated. Patient requires contact precautions if  hospitalized. CRITICAL RESULT CALLED TO, READ BACK BY AND VERIFIED WITH: E JACKSON PHARMD 10/08/2020 0245 JDW    Staphylococcus epidermidis NOT DETECTED NOT DETECTED Final   Staphylococcus lugdunensis NOT DETECTED NOT DETECTED Final   Streptococcus species NOT DETECTED NOT DETECTED Final   Streptococcus agalactiae NOT DETECTED NOT DETECTED Final   Streptococcus pneumoniae NOT DETECTED NOT DETECTED Final   Streptococcus pyogenes NOT DETECTED NOT DETECTED Final   A.calcoaceticus-baumannii NOT DETECTED NOT DETECTED Final   Bacteroides fragilis NOT DETECTED NOT DETECTED Final   Enterobacterales NOT DETECTED NOT DETECTED Final   Enterobacter cloacae complex NOT DETECTED NOT DETECTED Final   Escherichia coli NOT DETECTED NOT DETECTED Final   Klebsiella aerogenes NOT DETECTED NOT DETECTED Final   Klebsiella oxytoca NOT DETECTED NOT DETECTED Final   Klebsiella pneumoniae NOT DETECTED NOT DETECTED Final   Proteus species NOT DETECTED NOT DETECTED Final   Salmonella species NOT DETECTED NOT DETECTED Final   Serratia marcescens NOT DETECTED NOT DETECTED Final   Haemophilus influenzae NOT DETECTED NOT DETECTED Final   Neisseria meningitidis NOT DETECTED NOT DETECTED Final   Pseudomonas aeruginosa NOT DETECTED NOT DETECTED Final   Stenotrophomonas maltophilia NOT DETECTED NOT DETECTED Final   Candida albicans NOT DETECTED NOT DETECTED Final   Candida auris NOT DETECTED NOT DETECTED Final   Candida glabrata NOT DETECTED NOT DETECTED Final   Candida krusei NOT DETECTED NOT DETECTED Final   Candida parapsilosis NOT DETECTED NOT DETECTED Final   Candida tropicalis NOT DETECTED NOT DETECTED Final   Cryptococcus neoformans/gattii NOT DETECTED NOT DETECTED Final   Meth resistant mecA/C and  MREJ DETECTED (A) NOT DETECTED Final    Comment: CRITICAL RESULT CALLED TO, READ BACK BY AND VERIFIED WITHSeleta Rhymes [facility] 10/08/2020 0245 JDW Performed at [facility], [address] [place], [place] [address]   Urine culture     Status: Abnormal   Collection Time: 09/30/2020 11:02 AM   Specimen: In/Out Cath Urine  Result Value Ref Range Status   Specimen Description   Final    IN/OUT CATH URINE Performed at [facility] [address] [place], [place] [address]    Special Requests   Final    NONE Performed at [facility], [place] [address] [place], [place] [address]    Culture >=100,000 COLONIES/mL KLEBSIELLA PNEUMONIAE (A)  Final   Report Status 10/21/2020 FINAL  Final   Organism ID, Bacteria KLEBSIELLA PNEUMONIAE (A)  Final      Susceptibility   Klebsiella pneumoniae - MIC*    AMPICILLIN >=32 RESISTANT Resistant     CEFAZOLIN <=4 SENSITIVE Sensitive     CEFEPIME <=0.12 SENSITIVE Sensitive     CEFTRIAXONE <=0.25 SENSITIVE Sensitive     CIPROFLOXACIN <=0.25 SENSITIVE Sensitive     GENTAMICIN <=1 SENSITIVE Sensitive     IMIPENEM <=0.25 SENSITIVE Sensitive     NITROFURANTOIN 128 RESISTANT Resistant     TRIMETH/SULFA >=320 RESISTANT Resistant     AMPICILLIN/SULBACTAM >=32 RESISTANT Resistant     PIP/TAZO 32 INTERMEDIATE Intermediate     * >=100,000 COLONIES/mL KLEBSIELLA PNEUMONIAE  Culture, blood (routine x 2)     Status: None (Preliminary result)   Collection Time: 10/21/20  2:38 AM   Specimen: BLOOD  Result Value Ref Range Status  Specimen Description   Final    BLOOD LEFT HAND Performed at [facility] [address], [place], [place] [address]    Special Requests   Final    BOTTLES DRAWN AEROBIC ONLY Blood Culture adequate volume Performed at [facility] [address], [place], [place] [address]    Culture   Final    NO GROWTH 3 DAYS Performed at [facility], [place]  [address], [place], [place] [address]    Report Status PENDING  Incomplete  Culture, blood (routine x 2)     Status: None (Preliminary result)   Collection Time: 10/21/20  2:39 AM   Specimen: BLOOD  Result Value Ref Range Status   Specimen Description   Final    BLOOD BLOOD RIGHT FOREARM Performed at [facility] [address] [place], [place] [address]    Special Requests   Final    BOTTLES DRAWN AEROBIC ONLY Blood Culture results may not be optimal due to an inadequate volume of blood received in culture bottles Performed at [facility] [address] [place], [place] [address]    Culture   Final    NO GROWTH 3 DAYS Performed at [facility], [place] [address] [place], [place] [address]    Report Status PENDING  Incomplete         Radiology Studies: No results found.      Scheduled Meds:  Chlorhexidine Gluconate Cloth  6 each Topical Daily   collagenase   Topical Daily   Gerhardt's butt cream   Topical TID   lidocaine  1 patch Transdermal Q24H   sotalol  80 mg Oral Daily   And   sotalol  120 mg Oral QHS   tamsulosin  0.4 mg Oral Daily   Continuous Infusions:    LOS: 4 days   Georgette Shell, MD 10/25/2020, 11:09 AM

## 2020-10-25 NOTE — TOC Progression Note (Signed)
Transition of Care [facility]) - Progression Note    Patient Details  Name: Timothy Neal MRN: [id number] Date of Birth: June 09, 1941  Transition of [facility]) CM/SW Contact  [address], [place], [place] Phone Number: 10/25/2020, 10:42 AM  Clinical Narrative:    Phone call to [facility] number obtained to fax referral-[phone]. Clinicals faxed for their review.  Transition of Care to continue to follow   [facility], LCSW Transition of Care [phone]    Expected Discharge Plan: [place] Barriers to Discharge: Continued Medical Work up  Expected Discharge Plan and Services Expected Discharge Plan: [place]   Discharge Planning Services: CM Consult   Living arrangements for the past 2 months: [place]                                       Social Determinants of Health (SDOH) Interventions    Readmission Risk Interventions No flowsheet data found.

## 2020-10-26 ENCOUNTER — Ambulatory Visit: Payer: Medicare PPO

## 2020-10-26 LAB — CULTURE, BLOOD (ROUTINE X 2)
Culture: NO GROWTH
Culture: NO GROWTH
Special Requests: ADEQUATE

## 2020-10-27 ENCOUNTER — Ambulatory Visit: Payer: Medicare PPO

## 2020-10-28 ENCOUNTER — Ambulatory Visit: Payer: Medicare PPO

## 2020-10-29 ENCOUNTER — Ambulatory Visit: Payer: Medicare PPO

## 2020-10-30 ENCOUNTER — Ambulatory Visit: Payer: Medicare PPO

## 2020-10-30 NOTE — Progress Notes (Signed)
  Radiation Oncology         [phone]) [phone] ________________________________  Name: Timothy Neal MRN: [id number]  Date: 10/17/2020  DOB: Jan 11, 1941  End of Treatment Note  Diagnosis:   Hemangioma of the Spine   Indication for treatment:  palliative       Radiation treatment dates:  09/24/20-10/22/20  Site/planned dose:   The T6 vertebral body received 32.4 Gy in 18 fractions though his prescription was for 45 Gy in 25 fractions.  Narrative: The patient's status declined and he developed bacteremia likely from decubitus ulcers. He was admitted during the course of radiation, and  radiotherapy was discontinued after 18 treatments as his clinical course steadily declined.   Plan: He will be receiving hospice based care in the near future. ________________________________     Carola Rhine, PAC

## 2020-10-30 NOTE — Death Summary Note (Signed)
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 Presenting with increased weakness over the last weekwith anemia as well as dark stools. 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 He normally is able to use a walker to move at least 15 yards per his sister at bedside. Over the last week, he's had increasing overall weakness and lethargy; as well as an acute inability to use his walker or move on his own. She notes that his sacral wound seems worseand may have had some subjective fever.He c/o on shoulder pain, but she notes that he had been putting extra strain on that right shoulder. She believes that he may have injured it during those exercises. Of note, patient [facility]. In the ED, ptwas found to have a 3.5 pt drop in his Hgb and he was FOBT positive. GI was consulted. There was concern for worsening BLE weakness. Neurosurgery was consulted.L hand cellulitis was noted.Blood cx noted to grow MRSA.  Final Diagnoses:   #1 MRSA bacteremia/Klebsiella UTI -seen by ID started on daptomycin.   Transthoracic echo -left ventricular ejection fraction 25 to 30%.  Severely decreased function.  Global hypokinesis.  Moderate asymmetric left ventricular hypertrophy of the basal septal segment.  RV systolic function is moderately reduced.  Moderately elevated pulmonary artery systolic pressure.  Moderate AR moderate AS Cancelled TEE as family and patient wanted  to move towards hospice care.  All the antibiotics were  stopped at this point.  He also received cefepime for UTI. Patient passed away without any distress at 8:50 AM on 11/12/20.  I had seen him prior to passing away and he appeared comfortable without any pain.  #2 sacral wound present on admission-.  Patient has numerous skin lesions to buttocks, unstageable pressure injury to the left ilial tuberosity, weeping areas of partial skin loss to the bilateral lower extremities erythema and skin loss right buttock scrotum and medial thighs, trauma to the left hand and first digit.  #3 esophageal candidiasis-was on fluconazole, stopped as patient going to be comfort care.  #4 upper GI bleed -likely secondary to [facility] lesions in the setting of Eliquis use.  Status post EGD with Lysbeth Galas lesions, esophageal plaques consistent with esophageal candidiasis.  10 nonbleeding gastric ulcers with no stigmata of bleeding.  #5 spinal hemangioma-was getting radiation which is also been stopped as patient going into comfort care.  #6 protein calorie malnutrition continue and appreciate dietary input.ALB 1.8.  #7 hypokalemia was repleted.  #8 right shoulder pain secondary to partial tear of the subscapularis and  supraspinatus tendons.  Small right glenohumeral joint effusion and mild joint arthritis.  Lidocaine patch was ordered.   The results of significant diagnostics from this hospitalization (including imaging, microbiology, ancillary and laboratory) are listed below for reference.    Significant Diagnostic Studies: DG Chest 1 View  Result Date: 10/22/2020 CLINICAL DATA:  Cough. EXAM: CHEST  1 VIEW COMPARISON:  11/05/2020. FINDINGS: Stable cardiomegaly. Status post coronary bypass graft. Left-sided pacemaker is unchanged in position. No pneumothorax or pleural effusion is noted. Lungs are  clear. Bony thorax is unremarkable. IMPRESSION: No active disease. Electronically Signed   By: Marijo Conception M.D.   On: 10/22/2020 13:35   DG Shoulder  Right  Result Date: 09/29/2020 CLINICAL DATA:  Right shoulder pain. EXAM: RIGHT SHOULDER - 2+ VIEW COMPARISON:  None. FINDINGS: There is no evidence of fracture or dislocation. There is no evidence of arthropathy or other focal bone abnormality. Soft tissues are unremarkable. IMPRESSION: Negative. Electronically Signed   By: Marijo Conception M.D.   On: 10/10/2020 10:52   CT SHOULDER RIGHT WO CONTRAST  Result Date: 10/21/2020 CLINICAL DATA:  Shoulder trauma EXAM: CT OF THE UPPER RIGHT EXTREMITY WITHOUT CONTRAST TECHNIQUE: Multidetector CT imaging of the upper right extremity was performed according to the standard protocol. COMPARISON:  None. FINDINGS: Bones/Joint/Cartilage No fracture or dislocation. There is mild glenohumeral joint osteoarthritis with joint space loss. Mild AC joint arthrosis is noted. A small joint effusion is present. Ligaments Suboptimally assessed by CT. Muscles and Tendons Probable partial tear of the superior subscapularis and anterior supraspinatus is noted. There is mild overlying soft tissue swelling. The infraspinatus and teres minor tendons do appear to be grossly intact. No muscular atrophy is seen. Soft tissues Mild soft tissue edema seen along the lateral and posterior aspect of the shoulder. Scattered vascular calcifications are noted. A small right pleural effusion with patchy airspace opacity at the right lung base is noted IMPRESSION: No acute osseous abnormality Probable partial tear of the superior subscapularis and anterior supraspinatus tendons, however somewhat limited due to technique Small glenohumeral joint effusion Mild glenohumeral joint osteoarthritis Small right pleural effusion and adjacent patchy airspace opacity at the right lung base Electronically Signed   By: Prudencio Pair M.D.   On: 10/21/2020 17:55   DG Chest Port 1 View  Result Date: 10/05/2020 CLINICAL DATA:  Sepsis. EXAM: PORTABLE CHEST 1 VIEW COMPARISON:  September 21, 2020. FINDINGS: Stable  cardiomegaly. Status post coronary bypass graft. Left-sided pacemaker is unchanged in position. No pneumothorax or pleural effusion is noted. Lungs are clear. Bony thorax is unremarkable. IMPRESSION: No active disease. Electronically Signed   By: Marijo Conception M.D.   On: 10/18/2020 10:51   ECHOCARDIOGRAM COMPLETE  Result Date: 10/21/2020    ECHOCARDIOGRAM REPORT   Patient Name:   DAVIDSON PALMIERI Date of Exam: 10/21/2020 Medical Rec #:  [id number]    Height:       67.0 in Accession #:    [id number]   Weight:       158.7 lb Date of Birth:  08/18/1940    BSA:          1.833 m Patient Age:    64 years     BP:           130/67 mmHg Patient Gender: M            HR:           70 bpm. Exam Location:  Inpatient Procedure: 2D Echo, Cardiac Doppler and Color Doppler Indications:    Bacteremia R78.81  History:        Patient has no prior history of Echocardiogram examinations.                 Cardiomyopathy and CHF, CAD, Defibrillator and Prior CABG,                 Arrythmias:Atrial Fibrillation; Risk Factors:Dyslipidemia,                 Diabetes  and Sleep Apnea. CKD.  Sonographer:    Tiffany Dance Referring Phys: [id number] [place]  1. Left ventricular ejection fraction, by estimation, is 25 to 30%. The left ventricle has severely decreased function. The left ventricle demonstrates global hypokinesis. There is moderate asymmetric left ventricular hypertrophy of the basal-septal segment. Left ventricular diastolic parameters are indeterminate.  2. Right ventricular systolic function is moderately reduced. The right ventricular size is mildly enlarged. There is moderately elevated pulmonary artery systolic pressure. The estimated right ventricular systolic pressure is 16.0 mmHg.  3. Left atrial size was severely dilated.  4. Right atrial size was severely dilated.  5. The mitral valve is abnormal. Moderate mitral valve regurgitation. Moderate mitral stenosis. MG 6 mmHg at HR 70 bpm, MVA 1.2 cm^2 by  continuity equation. Severe mitral annular calcification.  6. Tricuspid valve regurgitation is mild to moderate.  7. The aortic valve is tricuspid. There is moderate calcification of the aortic valve. Aortic valve regurgitation is moderate. Mild to moderate aortic valve stenosis. AVA (1.3 cm^2) and DI (0.4) suggest moderate AS, but MG only 2mmHg likely due to systolic dysfunction  8. The inferior vena cava is dilated in size with >50% respiratory variability, suggesting right atrial pressure of 8 mmHg.  9. Evidence of atrial level shunting detected by color flow Doppler, suggests PFO. Conclusion(s)/Recommendation(s): No vegetation seen. If clinical suspicion for endocarditis, recommend TEE. FINDINGS  Left Ventricle: Left ventricular ejection fraction, by estimation, is 25 to 30%. The left ventricle has severely decreased function. The left ventricle demonstrates global hypokinesis. The left ventricular internal cavity size was normal in size. There is moderate asymmetric left ventricular hypertrophy of the basal-septal segment. Left ventricular diastolic parameters are indeterminate. Right Ventricle: The right ventricular size is mildly enlarged. Right vetricular wall thickness was not well visualized. Right ventricular systolic function is moderately reduced. There is moderately elevated pulmonary artery systolic pressure. The tricuspid regurgitant velocity is 3.59 m/s, and with an assumed right atrial pressure of 8 mmHg, the estimated right ventricular systolic pressure is 10.9 mmHg. Left Atrium: Left atrial size was severely dilated. Right Atrium: Right atrial size was severely dilated. Pericardium: There is no evidence of pericardial effusion. Mitral Valve: The mitral valve is abnormal. Severe mitral annular calcification. Moderate mitral valve regurgitation. Moderate mitral valve stenosis. MV peak gradient, 17.3 mmHg. The mean mitral valve gradient is 6.0 mmHg. Tricuspid Valve: The tricuspid valve is normal in  structure. Tricuspid valve regurgitation is mild to moderate. Aortic Valve: The aortic valve is tricuspid. There is moderate calcification of the aortic valve. Aortic valve regurgitation is moderate. Aortic regurgitation PHT measures 359 msec. Mild to moderate aortic stenosis is present. Aortic valve mean gradient measures 8.0 mmHg. Aortic valve peak gradient measures 16.5 mmHg. Aortic valve area, by VTI measures 1.31 cm. Pulmonic Valve: The pulmonic valve was grossly normal. Pulmonic valve regurgitation is not visualized. Aorta: The aortic root and ascending aorta are structurally normal, with no evidence of dilitation. Venous: The inferior vena cava is dilated in size with greater than 50% respiratory variability, suggesting right atrial pressure of 8 mmHg. IAS/Shunts: Evidence of atrial level shunting detected by color flow Doppler.  LEFT VENTRICLE PLAX 2D LVIDd:         4.90 cm  Diastology LVIDs:         4.30 cm  LV e' medial:    2.68 cm/s LV PW:         1.30 cm  LV E/e' medial:  70.5 LV IVS:  1.50 cm  LV e' lateral:   4.81 cm/s LVOT diam:     2.00 cm  LV E/e' lateral: 39.3 LV SV:         51 LV SV Index:   28 LVOT Area:     3.14 cm  RIGHT VENTRICLE            IVC RV Basal diam:  3.70 cm    IVC diam: 2.50 cm RV Mid diam:    2.60 cm RV S prime:     7.83 cm/s TAPSE (M-mode): 1.3 cm LEFT ATRIUM              Index       RIGHT ATRIUM           Index LA diam:        6.20 cm  3.38 cm/m  RA Area:     30.20 cm LA Vol (A2C):   140.0 ml 76.37 ml/m RA Volume:   108.00 ml 58.92 ml/m LA Vol (A4C):   138.0 ml 75.28 ml/m LA Biplane Vol: 142.0 ml 77.47 ml/m  AORTIC VALVE AV Area (Vmax):    1.23 cm AV Area (Vmean):   1.36 cm AV Area (VTI):     1.31 cm AV Vmax:           203.00 cm/s AV Vmean:          133.000 cm/s AV VTI:            0.390 m AV Peak Grad:      16.5 mmHg AV Mean Grad:      8.0 mmHg LVOT Vmax:         79.40 cm/s LVOT Vmean:        57.400 cm/s LVOT VTI:          0.163 m LVOT/AV VTI ratio: 0.42 AI PHT:             359 msec  AORTA Ao Root diam: 3.40 cm Ao Asc diam:  3.60 cm MITRAL VALVE                 TRICUSPID VALVE MV Area (PHT): 2.78 cm      TR Peak grad:   51.6 mmHg MV Area VTI:   1.21 cm      TR Vmax:        359.00 cm/s MV Peak grad:  17.3 mmHg MV Mean grad:  6.0 mmHg      SHUNTS MV Vmax:       2.08 m/s      Systemic VTI:  0.16 m MV Vmean:      108.0 cm/s    Systemic Diam: 2.00 cm MV Decel Time: 273 msec MR Peak grad:    131.8 mmHg MR Mean grad:    79.5 mmHg MR Vmax:         574.00 cm/s MR Vmean:        416.5 cm/s MR PISA:         2.26 cm MR PISA Eff ROA: 16 mm MR PISA Radius:  0.60 cm MV E velocity: 189.00 cm/s Oswaldo Milian MD Electronically signed by Oswaldo Milian MD Signature Date/Time: 10/21/2020/4:55:55 PM    Final     Microbiology: Recent Results (from the past 240 hour(s))  Blood Culture (routine x 2)     Status: Abnormal   Collection Time: 10/14/2020 10:00 AM   Specimen: BLOOD LEFT HAND  Result Value Ref Range Status   Specimen Description   Final  BLOOD LEFT HAND Performed at [facility] [address] [place], [place] [address]    Special Requests   Final    BOTTLES DRAWN AEROBIC AND ANAEROBIC Blood Culture results may not be optimal due to an inadequate volume of blood received in culture bottles Performed at [facility] [address], [place], [place] [address]    Culture  Setup Time   Final    IN BOTH AEROBIC AND ANAEROBIC BOTTLES GRAM POSITIVE COCCI CRITICAL VALUE NOTED.  VALUE IS CONSISTENT WITH PREVIOUSLY REPORTED AND CALLED VALUE.    Culture (A)  Final    STAPHYLOCOCCUS AUREUS SUSCEPTIBILITIES PERFORMED ON PREVIOUS CULTURE WITHIN THE LAST 5 DAYS. Performed at [facility], [place] [address] [place], [place] [address]    Report Status 10/22/2020 FINAL  Final  Resp Panel by RT-PCR (Flu A&B, Covid) Nasopharyngeal Swab     Status: None   Collection Time: 10/16/2020 10:13 AM   Specimen: Nasopharyngeal Swab;  Nasopharyngeal(NP) swabs in vial transport medium  Result Value Ref Range Status   SARS Coronavirus 2 by RT PCR NEGATIVE NEGATIVE Final    Comment: (NOTE) SARS-CoV-2 target nucleic acids are NOT DETECTED.  The SARS-CoV-2 RNA is generally detectable in upper respiratory specimens during the acute phase of infection. The lowest concentration of SARS-CoV-2 viral copies this assay can detect is 138 copies/mL. A negative result does not preclude SARS-Cov-2 infection and should not be used as the sole basis for treatment or other patient management decisions. A negative result may occur with  improper specimen collection/handling, submission of specimen other than nasopharyngeal swab, presence of viral mutation(s) within the areas targeted by this assay, and inadequate number of viral copies(<138 copies/mL). A negative result must be combined with clinical observations, patient history, and epidemiological information. The expected result is Negative.  Fact Sheet for Patients:  EntrepreneurPulse.com.au  Fact Sheet for Healthcare Providers:  IncredibleEmployment.be  This test is no t yet approved or cleared by the [place] FDA and  has been authorized for detection and/or diagnosis of SARS-CoV-2 by FDA under an Emergency Use Authorization (EUA). This EUA will remain  in effect (meaning this test can be used) for the duration of the COVID-19 declaration under Section 564(b)(1) of the Act, 21 U.S.C.section 360bbb-3(b)(1), unless the authorization is terminated  or revoked sooner.       Influenza A by PCR NEGATIVE NEGATIVE Final   Influenza B by PCR NEGATIVE NEGATIVE Final    Comment: (NOTE) The Xpert Xpress SARS-CoV-2/FLU/RSV plus assay is intended as an aid in the diagnosis of influenza from Nasopharyngeal swab specimens and should not be used as a sole basis for treatment. Nasal washings and aspirates are unacceptable for Xpert Xpress  SARS-CoV-2/FLU/RSV testing.  Fact Sheet for Patients: EntrepreneurPulse.com.au  Fact Sheet for Healthcare Providers: IncredibleEmployment.be  This test is not yet approved or cleared by the [place] FDA and has been authorized for detection and/or diagnosis of SARS-CoV-2 by FDA under an Emergency Use Authorization (EUA). This EUA will remain in effect (meaning this test can be used) for the duration of the COVID-19 declaration under Section 564(b)(1) of the Act, 21 U.S.C. section 360bbb-3(b)(1), unless the authorization is terminated or revoked.  Performed at [facility], [place] [address] [place], [place] [address]   Blood Culture (routine x 2)     Status: Abnormal   Collection Time: 10/04/2020 10:15 AM   Specimen: BLOOD  Result Value Ref Range Status   Specimen  Description   Final    BLOOD LEFT ANTECUBITAL Performed at [facility] [address] [place], [place] [address]    Special Requests   Final    BOTTLES DRAWN AEROBIC AND ANAEROBIC Blood Culture results may not be optimal due to an inadequate volume of blood received in culture bottles Performed at [facility] [address] [place], [place] [address]    Culture  Setup Time   Final    IN BOTH AEROBIC AND ANAEROBIC BOTTLES GRAM POSITIVE COCCI CRITICAL RESULT CALLED TO, READ BACK BY AND VERIFIED WITHSeleta Rhymes [facility] 10/28/2020 0245 JDW Performed at [facility], [place] [address] [place], [place] [address]    Culture METHICILLIN RESISTANT STAPHYLOCOCCUS AUREUS (A)  Final   Report Status 10/22/2020 FINAL  Final   Organism ID, Bacteria METHICILLIN RESISTANT STAPHYLOCOCCUS AUREUS  Final      Susceptibility   Methicillin resistant staphylococcus aureus - MIC*    CIPROFLOXACIN >=8 RESISTANT Resistant     ERYTHROMYCIN >=8 RESISTANT Resistant     GENTAMICIN <=0.5 SENSITIVE Sensitive     OXACILLIN >=4 RESISTANT Resistant      TETRACYCLINE <=1 SENSITIVE Sensitive     VANCOMYCIN <=0.5 SENSITIVE Sensitive     TRIMETH/SULFA <=10 SENSITIVE Sensitive     CLINDAMYCIN RESISTANT Resistant     RIFAMPIN <=0.5 SENSITIVE Sensitive     Inducible Clindamycin POSITIVE Resistant     * METHICILLIN RESISTANT STAPHYLOCOCCUS AUREUS  Blood Culture ID Panel (Reflexed)     Status: Abnormal   Collection Time: 10/01/2020 10:15 AM  Result Value Ref Range Status   Enterococcus faecalis NOT DETECTED NOT DETECTED Final   Enterococcus Faecium NOT DETECTED NOT DETECTED Final   Listeria monocytogenes NOT DETECTED NOT DETECTED Final   Staphylococcus species DETECTED (A) NOT DETECTED Final    Comment: CRITICAL RESULT CALLED TO, READ BACK BY AND VERIFIED WITH: E JACKSON PHARMD 10/29/2020 0245 JDW    Staphylococcus aureus (BCID) DETECTED (A) NOT DETECTED Final    Comment: Methicillin (oxacillin)-resistant Staphylococcus aureus (MRSA). MRSA is predictably resistant to beta-lactam antibiotics (except ceftaroline). Preferred therapy is vancomycin unless clinically contraindicated. Patient requires contact precautions if  hospitalized. CRITICAL RESULT CALLED TO, READ BACK BY AND VERIFIED WITH: E JACKSON PHARMD 10/23/2020 0245 JDW    Staphylococcus epidermidis NOT DETECTED NOT DETECTED Final   Staphylococcus lugdunensis NOT DETECTED NOT DETECTED Final   Streptococcus species NOT DETECTED NOT DETECTED Final   Streptococcus agalactiae NOT DETECTED NOT DETECTED Final   Streptococcus pneumoniae NOT DETECTED NOT DETECTED Final   Streptococcus pyogenes NOT DETECTED NOT DETECTED Final   A.calcoaceticus-baumannii NOT DETECTED NOT DETECTED Final   Bacteroides fragilis NOT DETECTED NOT DETECTED Final   Enterobacterales NOT DETECTED NOT DETECTED Final   Enterobacter cloacae complex NOT DETECTED NOT DETECTED Final   Escherichia coli NOT DETECTED NOT DETECTED Final   Klebsiella aerogenes NOT DETECTED NOT DETECTED Final   Klebsiella oxytoca NOT DETECTED NOT  DETECTED Final   Klebsiella pneumoniae NOT DETECTED NOT DETECTED Final   Proteus species NOT DETECTED NOT DETECTED Final   Salmonella species NOT DETECTED NOT DETECTED Final   Serratia marcescens NOT DETECTED NOT DETECTED Final   Haemophilus influenzae NOT DETECTED NOT DETECTED Final   Neisseria meningitidis NOT DETECTED NOT DETECTED Final   Pseudomonas aeruginosa NOT DETECTED NOT DETECTED Final   Stenotrophomonas maltophilia NOT DETECTED NOT DETECTED Final   Candida albicans NOT DETECTED NOT DETECTED Final   Candida auris NOT DETECTED NOT DETECTED  Final   Candida glabrata NOT DETECTED NOT DETECTED Final   Candida krusei NOT DETECTED NOT DETECTED Final   Candida parapsilosis NOT DETECTED NOT DETECTED Final   Candida tropicalis NOT DETECTED NOT DETECTED Final   Cryptococcus neoformans/gattii NOT DETECTED NOT DETECTED Final   Meth resistant mecA/C and MREJ DETECTED (A) NOT DETECTED Final    Comment: CRITICAL RESULT CALLED TO, READ BACK BY AND VERIFIED WITHSeleta Rhymes [facility] 10/13/2020 0245 JDW Performed at [facility] [address], [place], [place] [address]   Urine culture     Status: Abnormal   Collection Time: 10/10/2020 11:02 AM   Specimen: In/Out Cath Urine  Result Value Ref Range Status   Specimen Description   Final    IN/OUT CATH URINE Performed at [facility] [address] [place], [place] [address]    Special Requests   Final    NONE Performed at [facility], [place] [address], [place], [place] [address]    Culture >=100,000 COLONIES/mL KLEBSIELLA PNEUMONIAE (A)  Final   Report Status 10/21/2020 FINAL  Final   Organism ID, Bacteria KLEBSIELLA PNEUMONIAE (A)  Final      Susceptibility   Klebsiella pneumoniae - MIC*    AMPICILLIN >=32 RESISTANT Resistant     CEFAZOLIN <=4 SENSITIVE Sensitive     CEFEPIME <=0.12 SENSITIVE Sensitive     CEFTRIAXONE <=0.25 SENSITIVE Sensitive     CIPROFLOXACIN <=0.25 SENSITIVE Sensitive      GENTAMICIN <=1 SENSITIVE Sensitive     IMIPENEM <=0.25 SENSITIVE Sensitive     NITROFURANTOIN 128 RESISTANT Resistant     TRIMETH/SULFA >=320 RESISTANT Resistant     AMPICILLIN/SULBACTAM >=32 RESISTANT Resistant     PIP/TAZO 32 INTERMEDIATE Intermediate     * >=100,000 COLONIES/mL KLEBSIELLA PNEUMONIAE  Culture, blood (routine x 2)     Status: None   Collection Time: 10/21/20  2:38 AM   Specimen: BLOOD  Result Value Ref Range Status   Specimen Description   Final    BLOOD LEFT HAND Performed at [facility] [address], [place], [place] [address]    Special Requests   Final    BOTTLES DRAWN AEROBIC ONLY Blood Culture adequate volume Performed at [facility] [address], [place], [place] [address]    Culture   Final    NO GROWTH 5 DAYS Performed at [facility], [place] [address] [place], [place] [address]    Report Status 11-16-20 FINAL  Final  Culture, blood (routine x 2)     Status: None   Collection Time: 10/21/20  2:39 AM   Specimen: BLOOD  Result Value Ref Range Status   Specimen Description   Final    BLOOD BLOOD RIGHT FOREARM Performed at [facility] [address] [place], [place] [address]    Special Requests   Final    BOTTLES DRAWN AEROBIC ONLY Blood Culture results may not be optimal due to an inadequate volume of blood received in culture bottles Performed at [facility] [address] [place], [place] [address]    Culture   Final    NO GROWTH 5 DAYS Performed at [facility], [place] [address] [place], [place] [address]    Report Status 16-Nov-2020 FINAL  Final     Labs: Basic Metabolic Panel: Recent Labs  Lab 10/09/2020 2352 10/21/20 0238 10/22/20 0056 10/29/2020 0533  NA 132* 135 131* 131*  K  3.5 3.4* 4.2 4.4  CL 99 105 104 104  CO2 23 20* 18* 19*  GLUCOSE 104* 234* 209* 260*  BUN 39* 46* 49* 60*  CREATININE 1.29* 1.37* 1.28* 1.26*  CALCIUM 9.2 9.4  9.4 9.4  MG  --  2.1  --   --    Liver Function Tests: Recent Labs  Lab 10/07/2020 2352 10/22/20 0056 10/18/2020 0533  AST 32 29 25  ALT 69* 94* 85*  ALKPHOS 44 54 53  BILITOT 1.0 1.0 1.0  PROT 4.9* 5.2* 5.2*  ALBUMIN 1.7* 1.8* 1.8*   No results for input(s): LIPASE, AMYLASE in the last 168 hours. No results for input(s): AMMONIA in the last 168 hours. CBC: Recent Labs  Lab 10/08/2020 1420 10/19/2020 1921 10/21/20 0238 10/21/20 1636 10/22/20 0056 10/13/2020 0533  WBC 7.1 7.0 8.4  --  7.1 9.0  NEUTROABS  --   --  7.8*  --   --   --   HGB 8.8* 9.4* 7.8* 9.8* 9.1* 9.2*  HCT 27.2* 29.0* 24.0* 30.2* 28.3* 28.5*  MCV 89.8 90.9 90.6  --  91.6 89.6  PLT 125* 124* 145*  --  149* 162   Cardiac Enzymes: Recent Labs  Lab 10/21/20 0238  CKTOTAL 16*   D-Dimer No results for input(s): DDIMER in the last 72 hours. BNP: Invalid input(s): POCBNP CBG: Recent Labs  Lab 10/22/20 1217 10/22/20 1730 10/22/20 2211 10/07/2020 0802 09/30/2020 1143  GLUCAP 166* 291* 330* 220* 195*   Anemia work up No results for input(s): VITAMINB12, FOLATE, FERRITIN, TIBC, IRON, RETICCTPCT in the last 72 hours. Urinalysis    Component Value Date/Time   COLORURINE YELLOW 10/16/2020 1102   APPEARANCEUR CLEAR 10/02/2020 1102   LABSPEC 1.012 10/03/2020 1102   PHURINE 5.0 10/02/2020 1102   GLUCOSEU NEGATIVE 10/09/2020 1102   HGBUR SMALL (A) 10/15/2020 1102   BILIRUBINUR NEGATIVE 10/22/2020 1102   KETONESUR NEGATIVE 10/21/2020 1102   PROTEINUR 30 (A) 09/30/2020 1102   NITRITE NEGATIVE 10/18/2020 1102   LEUKOCYTESUR SMALL (A) 09/29/2020 1102   Sepsis Labs Invalid input(s): PROCALCITONIN,  WBC,  LACTICIDVEN     SIGNED:  Georgette Shell, MD  [facility] 2020/11/16, 12:25 PM

## 2020-10-30 NOTE — Care Management Important Message (Signed)
Important Message  Patient Details IM Letter placed in Patients door caddy. Name: Timothy Neal MRN: [id number] Date of Birth: Dec 21, 1940   Medicare Important Message Given:  Yes     Kerin Salen Nov 15, 2020, 10:42 AM

## 2020-10-30 DEATH — deceased

## 2020-11-02 ENCOUNTER — Ambulatory Visit: Payer: Medicare PPO

## 2020-11-03 ENCOUNTER — Ambulatory Visit: Payer: Medicare PPO

## 2022-04-03 IMAGING — MR MR THORACIC SPINE WO/W CM
4 of 9 series · 18 of 48 positions shown · IV contrast (gadavist)
Comparison: CT thoracic spine 09/18/2020

CLINICAL DATA: Thoracic spinal lesion. Rule out malignancy or
hemangioma

EXAM:
MRI THORACIC WITHOUT AND WITH CONTRAST
TECHNIQUE: Multiplanar and multiecho pulse sequences of the thoracic spine were
obtained without and with intravenous contrast.
CONTRAST:  7mL GADAVIST GADOBUTROL 1 MMOL/ML IV SOLN

[Series 7: T2 · sagittal · 3.0mm · 0.62mm/px · 4 of 15 slices shown (1 of 2)]
[im 1/15]
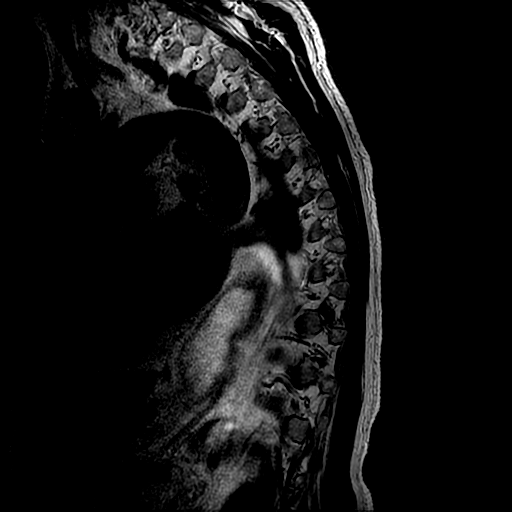
[im 5/15]
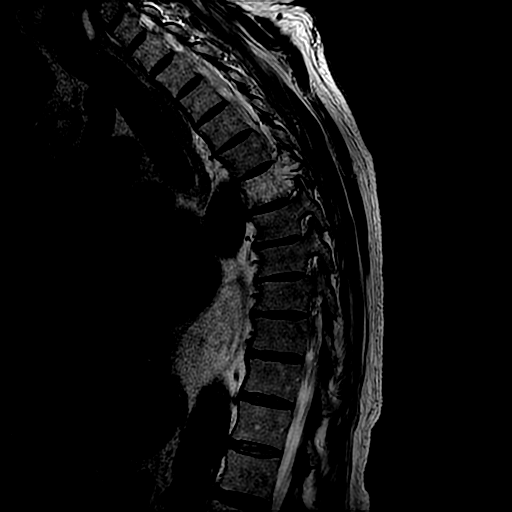
[im 10/15]
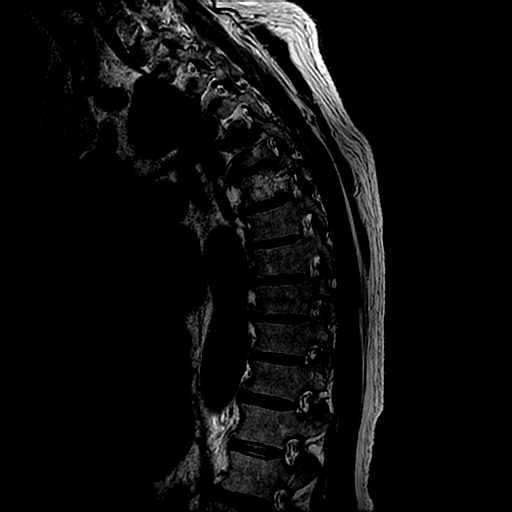
[im 15/15]
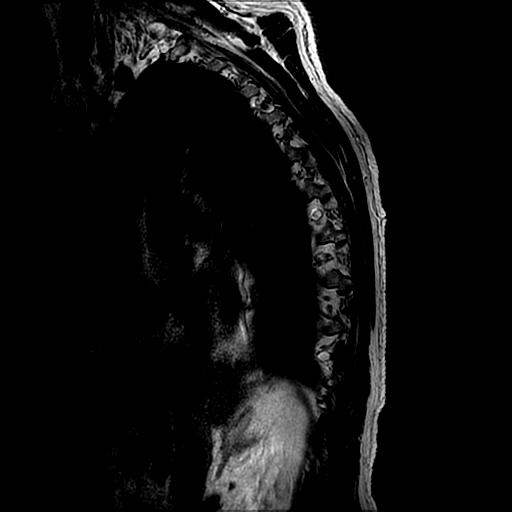

[Series 9: T1 · sagittal · 3.0mm · 0.62mm/px · 3 of 15 slices shown (1 of 2)]
[im 1/15]
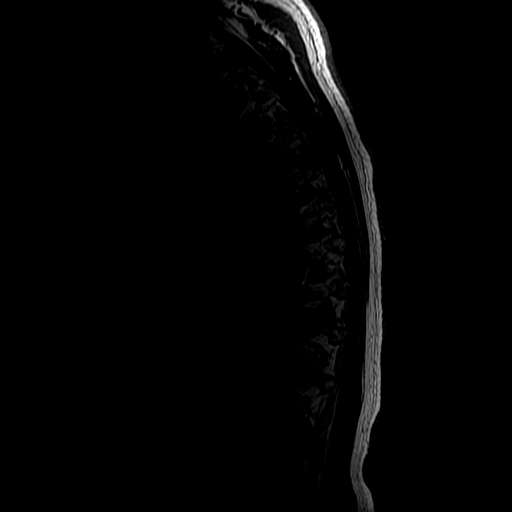
[im 8/15]
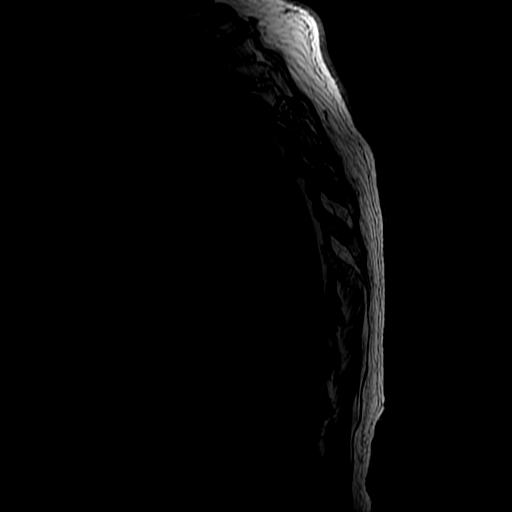
[im 15/15]
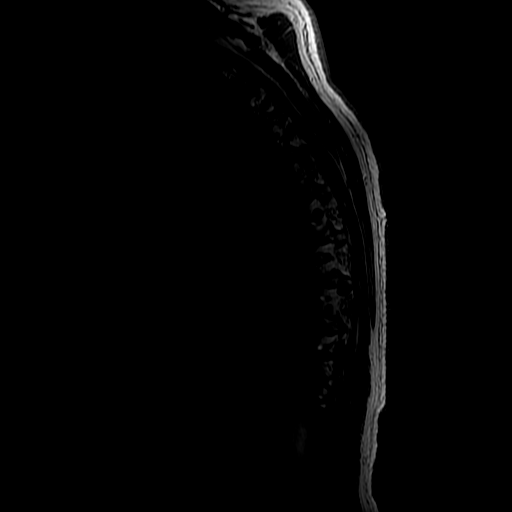

[Series 10: T2 · axial · 4.0mm · 0.43mm/px · z∈[-186,-25]mm · 8 of 37 slices shown (2 of 2)]
[im 1/37]
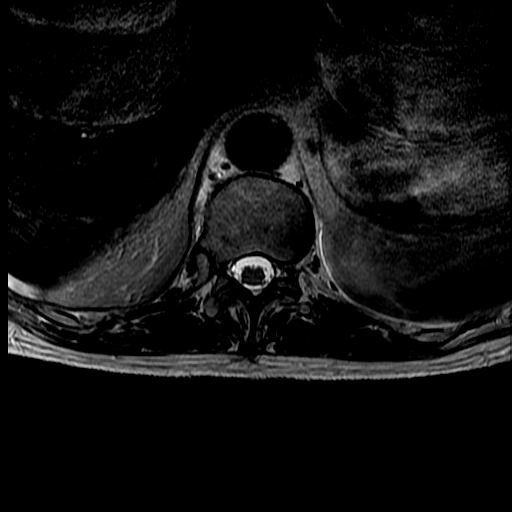
[im 6/37]
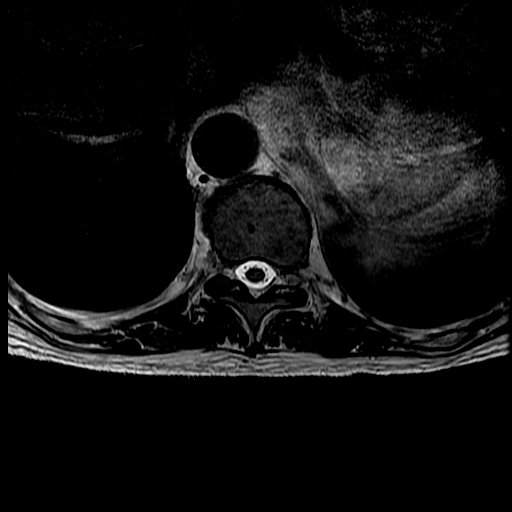
[im 11/37]
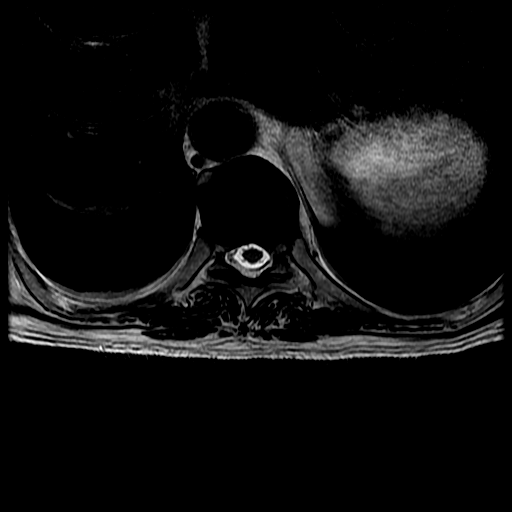
[im 16/37]
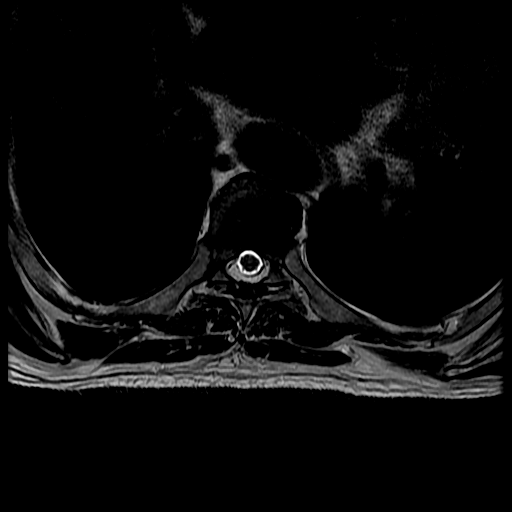
[im 21/37]
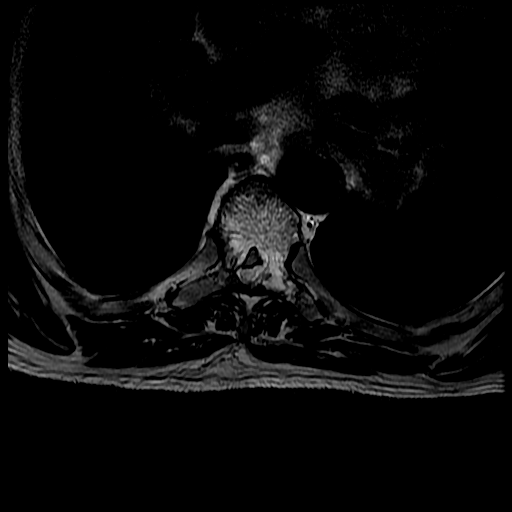
[im 26/37]
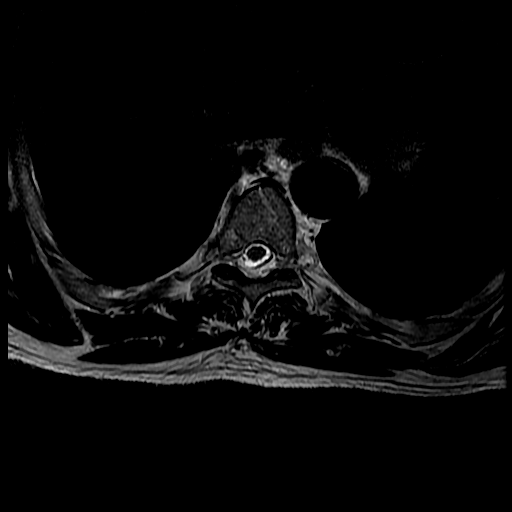
[im 31/37]
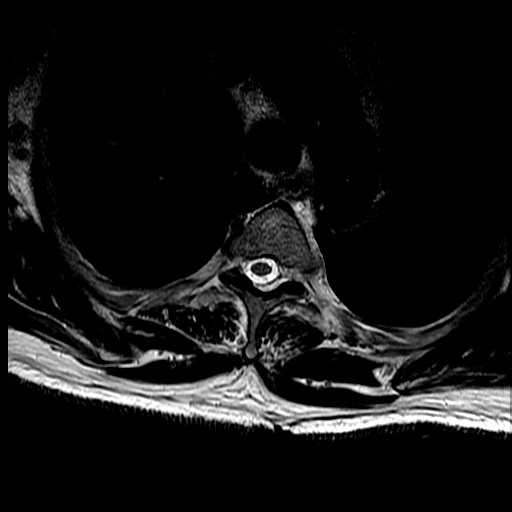
[im 37/37]
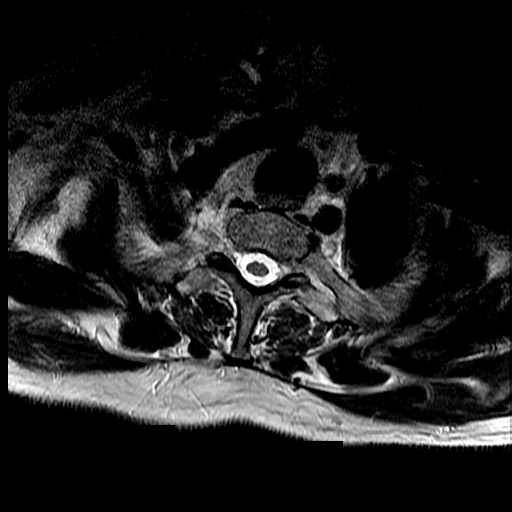

[Series 12: T1 · axial · non-contrast · 4.0mm · 0.43mm/px · z∈[-148,-53]mm · 3 of 37 slices shown (2 of 2)]
[im 6/37]
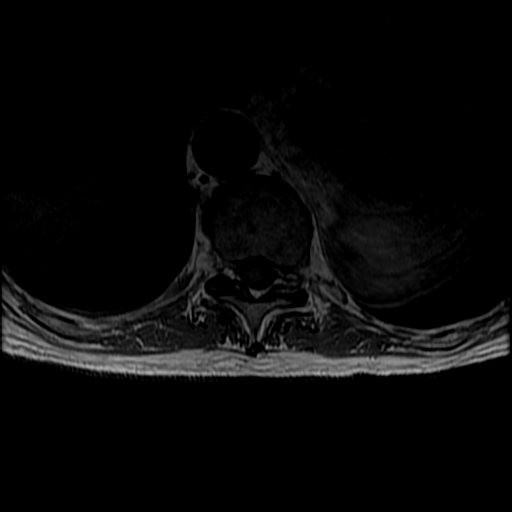
[im 21/37]
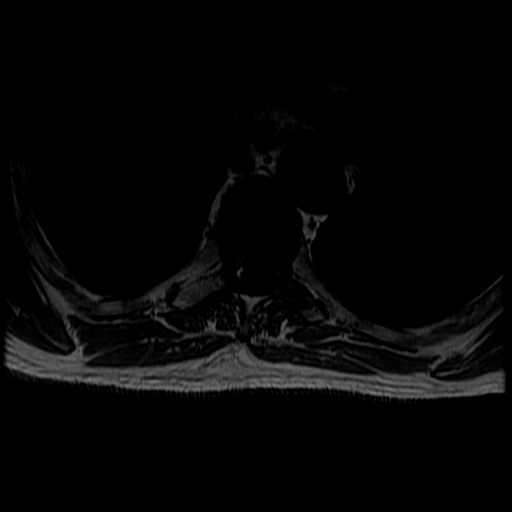
[im 31/37]
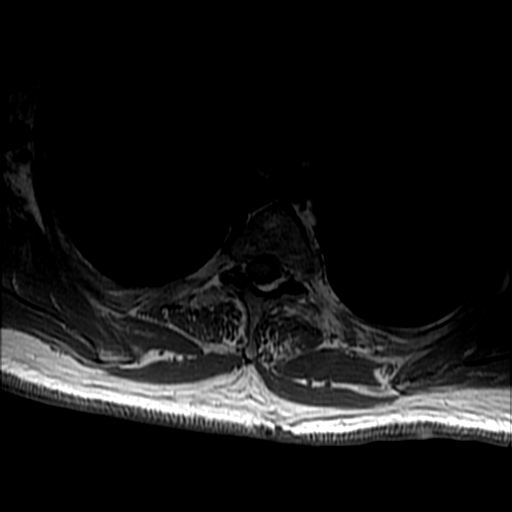

[18 of 48 positions shown; findings below may reference images not displayed]

FINDINGS: Alignment:  Normal

Vertebrae: Negative for fracture

Extensive abnormality throughout the T6 vertebral body which is
diffusely low signal on T1 and high signal on T2. Bony
trabeculations are seen throughout the vertebral body on CT. There
is extension of this process into the left T6 pedicle and also
extending into the left T6 transverse process and lamina. There is
extension into the spinal canal with enhancing epidural soft tissue
surrounding the cord and causing cord compression. Much of the bone
lesion also shows enhancement.

There is a second smaller lesion in the T3 vertebral body anteriorly
on the right measuring 1 cm. This is low signal on T1 and also shows
mild enhancement.

Cord: Cord compression at T6 due to epidural soft tissue mass. There
is mild cord edema at this level. Remainder of the cord signal is
normal.

Paraspinal and other soft tissues: No paraspinous mass or fluid
collection. 3 cm subpleural cyst left lower lobe posteriorly. Mild
left lower lobe airspace disease.

Disc levels:

Negative for thoracic disc protrusion. Mild disc space narrowing and
disc desiccation at multiple levels.
IMPRESSION: Extensive abnormality of the T6 vertebral body extending into the
posterior elements on the left. There is also extensive epidural
soft tissue process surrounding the cord and causing cord
compression at the T6 level. Mild associated cord edema. Bony
trabeculations are present within the vertebral body on CT without
aggressive bony destruction. No associated fat content in the
lesion. Favor aggressive hemangioma with extraosseous extension into
the epidural space. Malignancy including metastatic disease and
myeloma also considered.

Second 1 cm lesion T3 vertebral body with a similar appearance on
MRI.

## 2022-05-01 IMAGING — DX DG CHEST 1V PORT
1 series · 1 of 1 positions shown · non-contrast
Comparison: September 21, 2020.

CLINICAL DATA: Sepsis.

EXAM:
PORTABLE CHEST 1 VIEW

[chest ap]
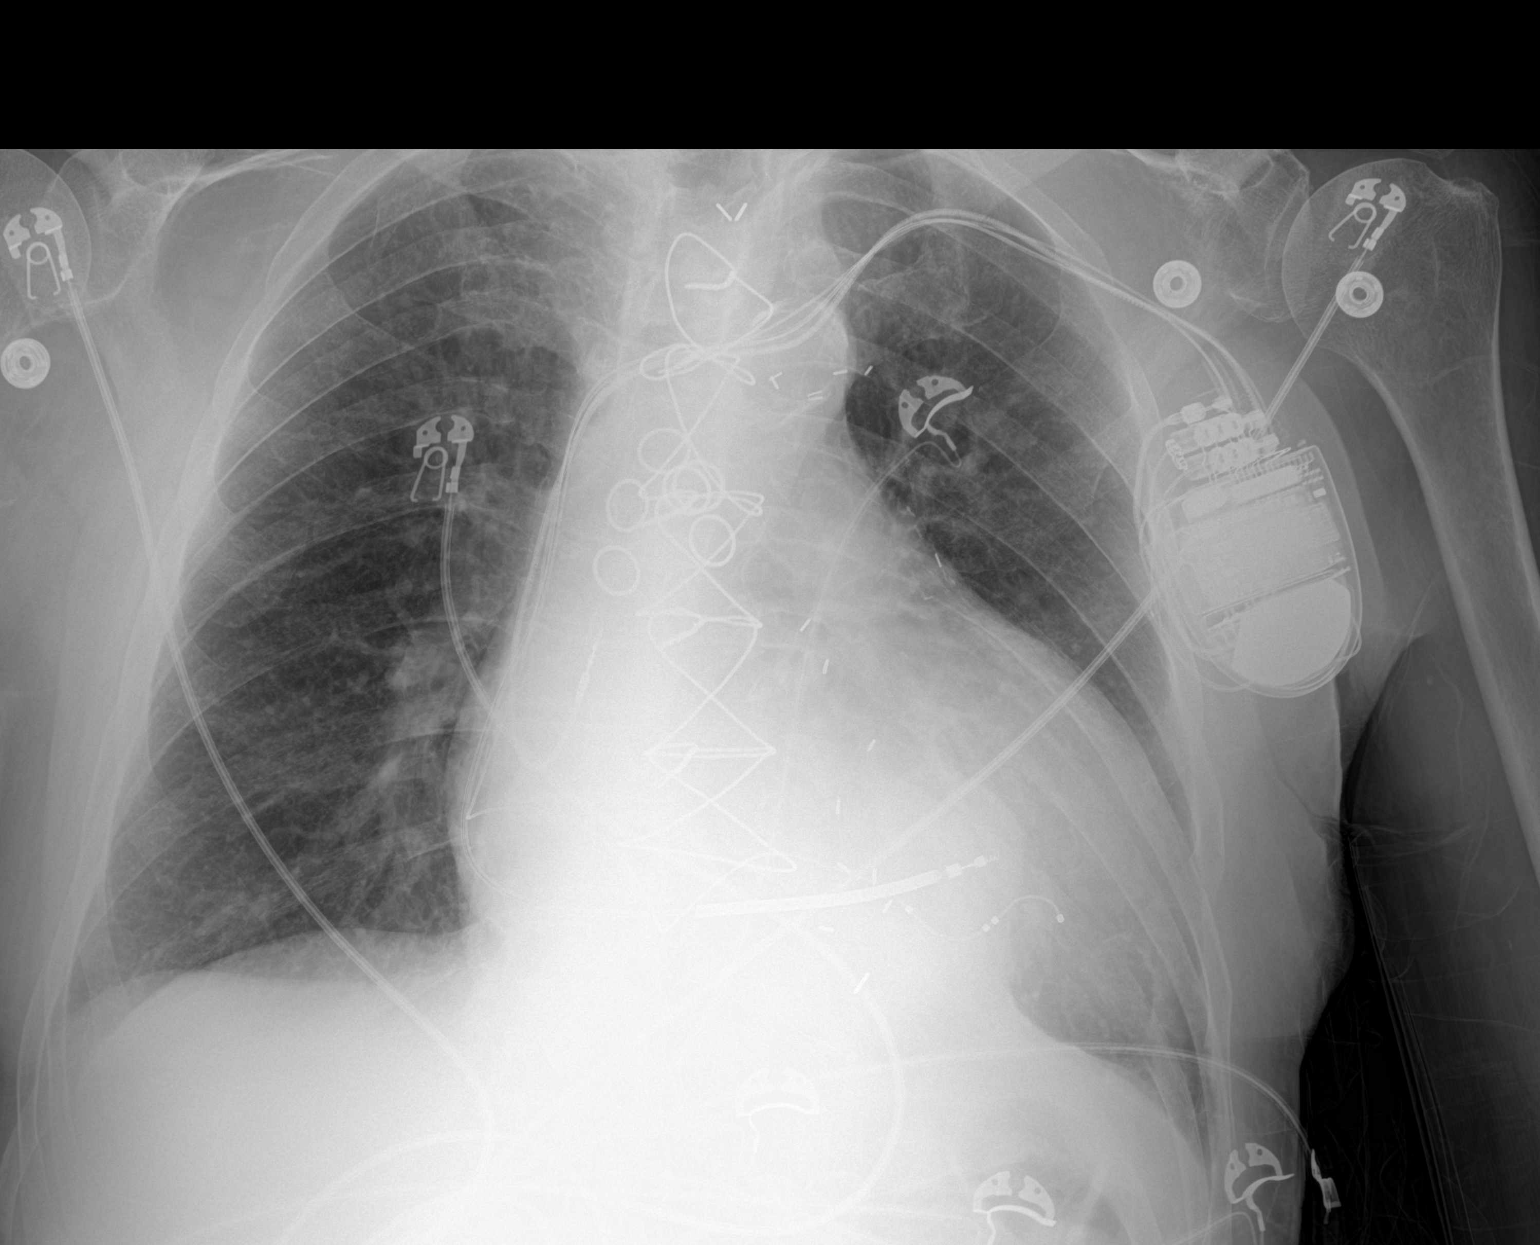

[1 of 1 positions shown; findings below may reference images not displayed]

FINDINGS: Stable cardiomegaly. Status post coronary bypass graft. Left-sided
pacemaker is unchanged in position. No pneumothorax or pleural
effusion is noted. Lungs are clear. Bony thorax is unremarkable.
IMPRESSION: No active disease.
